# Patient Record
Sex: Female | Born: 1950 | Race: White | Hispanic: No | Smoking: Former smoker
Health system: Southern US, Community
[De-identification: ages and names within clinical notes are randomized; demographics above are authoritative.]

## PROBLEM LIST (undated history)

## (undated) DIAGNOSIS — E119 Type 2 diabetes mellitus without complications: Secondary | ICD-10-CM

## (undated) DIAGNOSIS — Z8619 Personal history of other infectious and parasitic diseases: Secondary | ICD-10-CM

## (undated) DIAGNOSIS — Z1371 Encounter for nonprocreative screening for genetic disease carrier status: Secondary | ICD-10-CM

## (undated) DIAGNOSIS — I10 Essential (primary) hypertension: Secondary | ICD-10-CM

## (undated) DIAGNOSIS — H3530 Unspecified macular degeneration: Secondary | ICD-10-CM

## (undated) DIAGNOSIS — R928 Other abnormal and inconclusive findings on diagnostic imaging of breast: Secondary | ICD-10-CM

## (undated) DIAGNOSIS — Z1231 Encounter for screening mammogram for malignant neoplasm of breast: Principal | ICD-10-CM

## (undated) HISTORY — DX: Encounter for nonprocreative screening for genetic disease carrier status: Z13.71

## (undated) HISTORY — DX: Essential (primary) hypertension: I10

## (undated) HISTORY — PX: FOOT SURGERY: SHX648

## (undated) HISTORY — DX: Type 2 diabetes mellitus without complications: E11.9

## (undated) HISTORY — DX: Personal history of other infectious and parasitic diseases: Z86.19

## (undated) HISTORY — DX: Unspecified macular degeneration: H35.30

## (undated) HISTORY — PX: LASIK: SHX215

---

## 2004-07-13 ENCOUNTER — Ambulatory Visit: Payer: Self-pay | Admitting: General Surgery

## 2004-11-02 ENCOUNTER — Ambulatory Visit: Payer: Self-pay | Admitting: Family Medicine

## 2005-07-15 ENCOUNTER — Ambulatory Visit: Payer: Self-pay | Admitting: General Surgery

## 2006-07-22 ENCOUNTER — Ambulatory Visit: Payer: Self-pay | Admitting: General Surgery

## 2007-07-25 ENCOUNTER — Ambulatory Visit: Payer: Self-pay | Admitting: Family Medicine

## 2008-07-25 ENCOUNTER — Ambulatory Visit: Payer: Self-pay | Admitting: Family Medicine

## 2009-01-15 DIAGNOSIS — E119 Type 2 diabetes mellitus without complications: Secondary | ICD-10-CM | POA: Insufficient documentation

## 2009-01-15 DIAGNOSIS — E1121 Type 2 diabetes mellitus with diabetic nephropathy: Secondary | ICD-10-CM | POA: Insufficient documentation

## 2009-07-21 DIAGNOSIS — L219 Seborrheic dermatitis, unspecified: Secondary | ICD-10-CM | POA: Insufficient documentation

## 2009-10-07 ENCOUNTER — Ambulatory Visit: Payer: Self-pay | Admitting: General Surgery

## 2010-10-09 ENCOUNTER — Ambulatory Visit: Payer: Self-pay | Admitting: General Surgery

## 2011-05-18 DIAGNOSIS — N183 Chronic kidney disease, stage 3 (moderate): Secondary | ICD-10-CM

## 2011-05-18 DIAGNOSIS — N1832 Chronic kidney disease, stage 3b: Secondary | ICD-10-CM | POA: Insufficient documentation

## 2011-05-18 DIAGNOSIS — N184 Chronic kidney disease, stage 4 (severe): Secondary | ICD-10-CM | POA: Insufficient documentation

## 2011-10-12 ENCOUNTER — Ambulatory Visit: Payer: Self-pay | Admitting: General Surgery

## 2012-09-16 ENCOUNTER — Encounter: Payer: Self-pay | Admitting: *Deleted

## 2012-09-16 DIAGNOSIS — Z803 Family history of malignant neoplasm of breast: Secondary | ICD-10-CM | POA: Insufficient documentation

## 2012-09-16 DIAGNOSIS — I10 Essential (primary) hypertension: Secondary | ICD-10-CM | POA: Insufficient documentation

## 2012-09-16 DIAGNOSIS — E7800 Pure hypercholesterolemia, unspecified: Secondary | ICD-10-CM | POA: Insufficient documentation

## 2012-10-12 ENCOUNTER — Ambulatory Visit: Payer: Self-pay | Admitting: General Surgery

## 2012-10-17 ENCOUNTER — Encounter: Payer: Self-pay | Admitting: *Deleted

## 2012-10-25 ENCOUNTER — Ambulatory Visit: Payer: Self-pay | Admitting: General Surgery

## 2012-11-08 ENCOUNTER — Encounter: Payer: Self-pay | Admitting: General Surgery

## 2012-11-08 ENCOUNTER — Ambulatory Visit (INDEPENDENT_AMBULATORY_CARE_PROVIDER_SITE_OTHER): Payer: PRIVATE HEALTH INSURANCE | Admitting: General Surgery

## 2012-11-08 VITALS — BP 160/78 | HR 76 | Resp 14 | Ht 67.0 in | Wt 191.0 lb

## 2012-11-08 DIAGNOSIS — Z803 Family history of malignant neoplasm of breast: Secondary | ICD-10-CM

## 2012-11-08 DIAGNOSIS — Z1211 Encounter for screening for malignant neoplasm of colon: Secondary | ICD-10-CM

## 2012-11-08 NOTE — Patient Instructions (Addendum)
Patient advised of benefits of genetic testing for breast cancer. Patient uncertain about having a colonoscopy done. She has been asked to take home hemacult cards and return then at a later date.    Patient will be asked to return to the office in one year for a bilateral screening mammogram.

## 2012-11-08 NOTE — Progress Notes (Signed)
Patient ID: Anna Sweeney, female   DOB: 1951-02-24, 62 y.o.   MRN: [id number]  Chief Complaint  Patient presents with  . Follow-up    screening mammogram    HPI Anna Sweeney is a 62 y.o. female who presents for a follow up mammogram. Most recent mammogram was done on 10/12/12 at [facility] with a birad category 2. The patient has a family history of breast cancer including her mother and sister. Patient states no new problems with her breasts.  HPI  Past Medical History  Diagnosis Date  . High cholesterol   . Family history of malignant neoplasm of breast   . Hypertension 2009    Past Surgical History  Procedure Laterality Date  . Foot surgery      Family History  Problem Relation Age of Onset  . Breast cancer Mother   . Breast cancer Sister     Social History History  Substance Use Topics  . Smoking status: Former Smoker -- 1.00 packs/day for 4 years  . Smokeless tobacco: Not on file  . Alcohol Use: No    No Known Allergies  Current Outpatient Prescriptions  Medication Sig Dispense Refill  . GLIPIZIDE PO Take 5 mg by mouth daily.       Marland Kitchen lovastatin (MEVACOR) 20 MG tablet Take 1 tablet by mouth daily.      . metFORMIN (GLUCOPHAGE) 850 MG tablet Take 1 tablet by mouth daily.      . TRANDOLAPRIL PO Take 4 mg by mouth daily.       . verapamil (CALAN-SR) 240 MG CR tablet Take 1 tablet by mouth daily.       No current facility-administered medications for this visit.    Review of Systems Review of Systems  Constitutional: Negative.   Respiratory: Negative.   Cardiovascular: Negative.     Blood pressure 160/78, pulse 76, resp. rate 14, height 5\' 7"  (1.702 m), weight 191 lb (86.637 kg).  Physical Exam Physical Exam  Constitutional: She appears well-developed and well-nourished.  Eyes: Conjunctivae are normal. No scleral icterus.  Neck: Trachea normal. No mass and no thyromegaly present.  Cardiovascular: Normal rate, regular rhythm and normal heart sounds.   Pulses:    Dorsalis pedis pulses are 3+ on the right side, and 3+ on the left side.       Posterior tibial pulses are 3+ on the right side, and 3+ on the left side.  Pulmonary/Chest: Effort normal and breath sounds normal. Right breast exhibits no inverted nipple, no mass, no nipple discharge, no skin change and no tenderness. Left breast exhibits no inverted nipple, no mass, no nipple discharge, no skin change and no tenderness. Breasts are symmetrical.  Abdominal: Soft. Normal appearance and bowel sounds are normal. There is no hepatosplenomegaly. There is no tenderness. No hernia. Hernia confirmed negative in the ventral area.  Lymphadenopathy:    She has no cervical adenopathy.    She has no axillary adenopathy.       Right: No supraclavicular adenopathy present.       Left: No supraclavicular adenopathy present.    Data Reviewed Mammogram reviewed-BIRADS 2  Assessment    Breast exam is stable.      Plan    Discussed colonoscopy-pt still does not want to have it done. Agreed to stool check for blood. Given high risk, discussed genetic tesing- BRCA. Pt will get it done assuming insurance covers.        Greta Doom F 11/08/2012, 9:58 AM

## 2012-11-15 ENCOUNTER — Ambulatory Visit (INDEPENDENT_AMBULATORY_CARE_PROVIDER_SITE_OTHER): Payer: PRIVATE HEALTH INSURANCE | Admitting: *Deleted

## 2012-11-15 DIAGNOSIS — Z1211 Encounter for screening for malignant neoplasm of colon: Secondary | ICD-10-CM

## 2012-11-15 DIAGNOSIS — Z1371 Encounter for nonprocreative screening for genetic disease carrier status: Secondary | ICD-10-CM

## 2012-11-15 DIAGNOSIS — Z803 Family history of malignant neoplasm of breast: Secondary | ICD-10-CM

## 2012-11-15 HISTORY — DX: Encounter for nonprocreative screening for genetic disease carrier status: Z13.71

## 2012-11-15 NOTE — Patient Instructions (Addendum)
Genetic testing reviewed and completed.  Will call with results.  Patient brought in occult cards.  Dated 11-08-12, 11-09-12, 11-10-12, all negative.

## 2012-11-16 ENCOUNTER — Telehealth: Payer: Self-pay | Admitting: *Deleted

## 2012-11-16 NOTE — Telephone Encounter (Signed)
Notified patient as instructed, patient pleased. Discussed possible colonoscopy.

## 2012-11-16 NOTE — Telephone Encounter (Signed)
Message copied by Currie Paris on Thu Nov 16, 2012  8:47 AM ------      Message from: Kieth Brightly      Created: Thu Nov 16, 2012  8:21 AM       Inform pt stool checks were negative. Encourage her to still consider colonoscopy.       ----- Message -----         From: Currie Paris, RN         Sent: 11/15/2012  10:40 AM           To: Kieth Brightly, MD                   ------

## 2012-11-27 ENCOUNTER — Encounter: Payer: Self-pay | Admitting: General Surgery

## 2012-11-28 ENCOUNTER — Telehealth: Payer: Self-pay | Admitting: *Deleted

## 2012-11-28 NOTE — Telephone Encounter (Signed)
Please inform Anna Sweeney(DOB 04/10/1951)-her genetic testing was negative, Pt pleased.

## 2012-11-29 ENCOUNTER — Encounter: Payer: Self-pay | Admitting: General Surgery

## 2012-12-19 ENCOUNTER — Encounter: Payer: Self-pay | Admitting: General Surgery

## 2013-07-10 LAB — HM PAP SMEAR: HM PAP: NEGATIVE

## 2013-11-07 ENCOUNTER — Ambulatory Visit: Payer: Self-pay | Admitting: General Surgery

## 2013-11-07 IMAGING — MG MM DIGITAL SCREENING BILAT W/ CAD
1 series · 6 of 6 positions shown · non-contrast
Comparison: Previous exam(s).

CLINICAL DATA: Screening.

EXAM:
DIGITAL SCREENING BILATERAL MAMMOGRAM WITH CAD

[R CC · right · 6 of 6 slices shown]
[im 1/6]
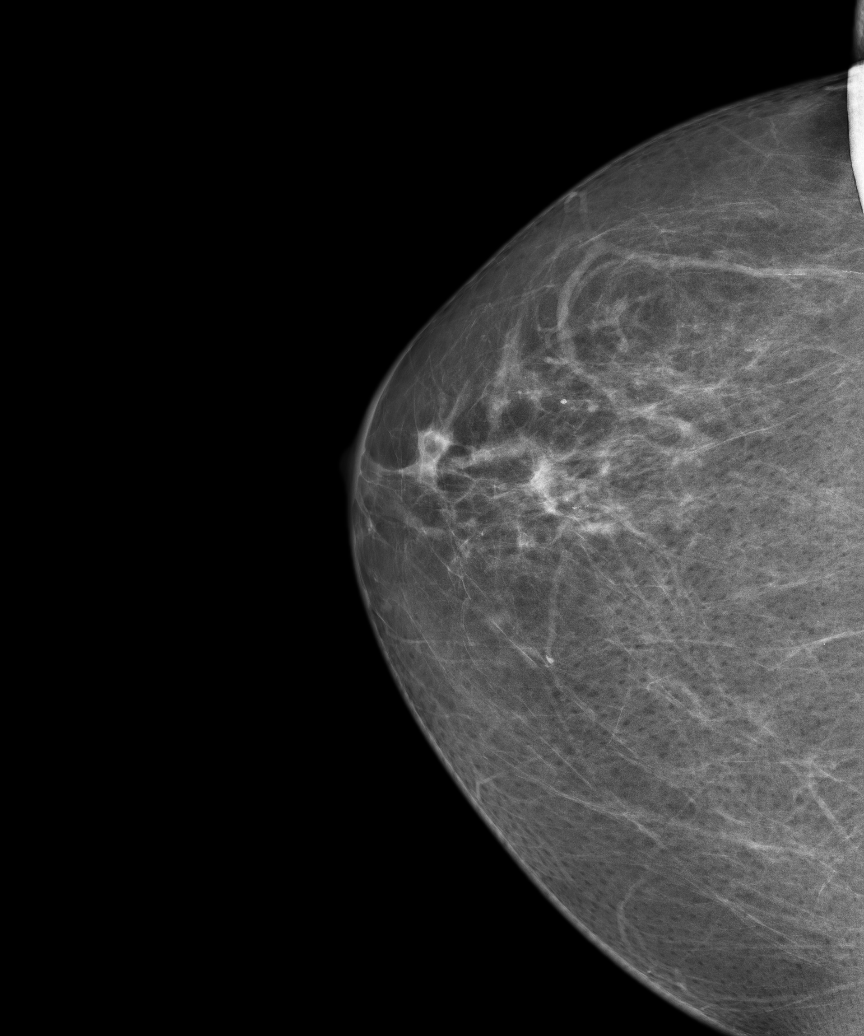
[im 2/6]
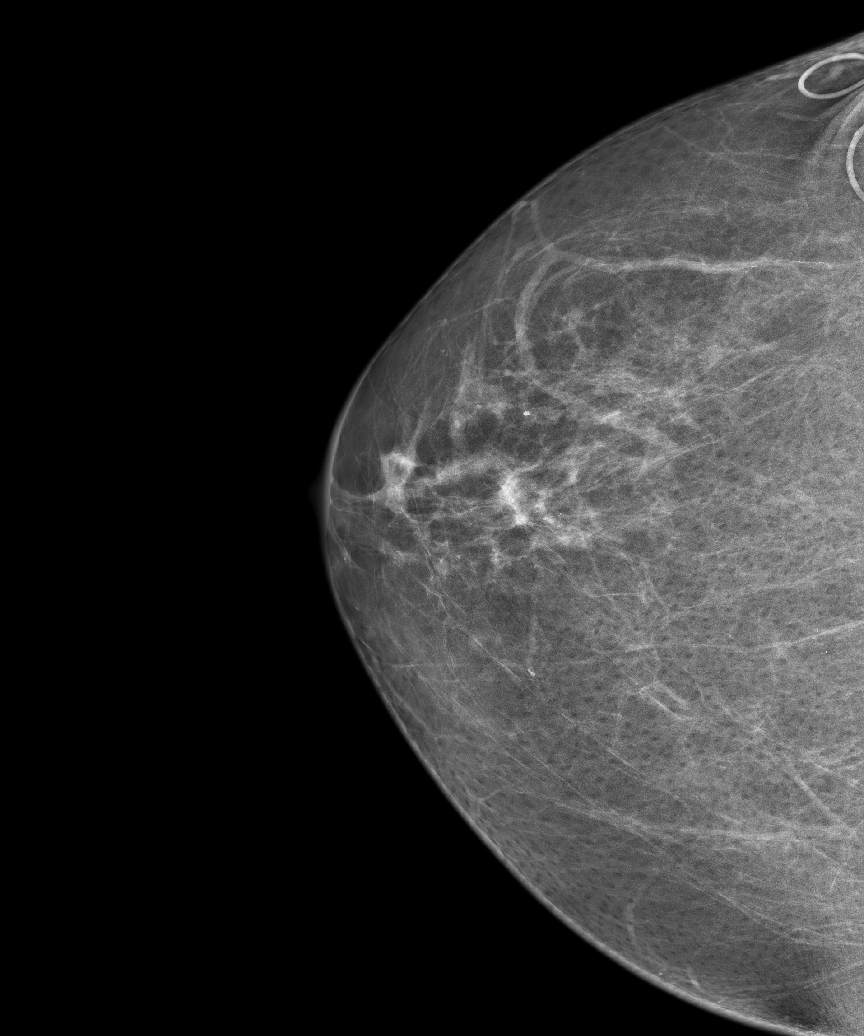
[im 3/6]
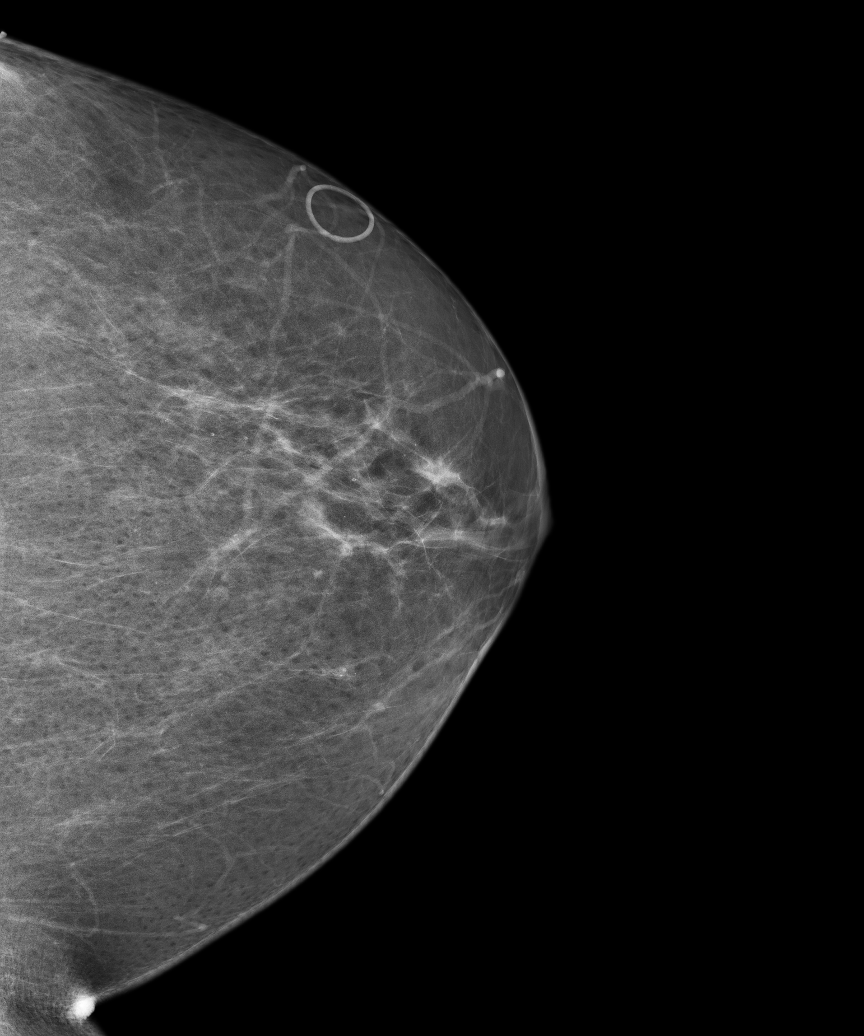
[im 4/6]
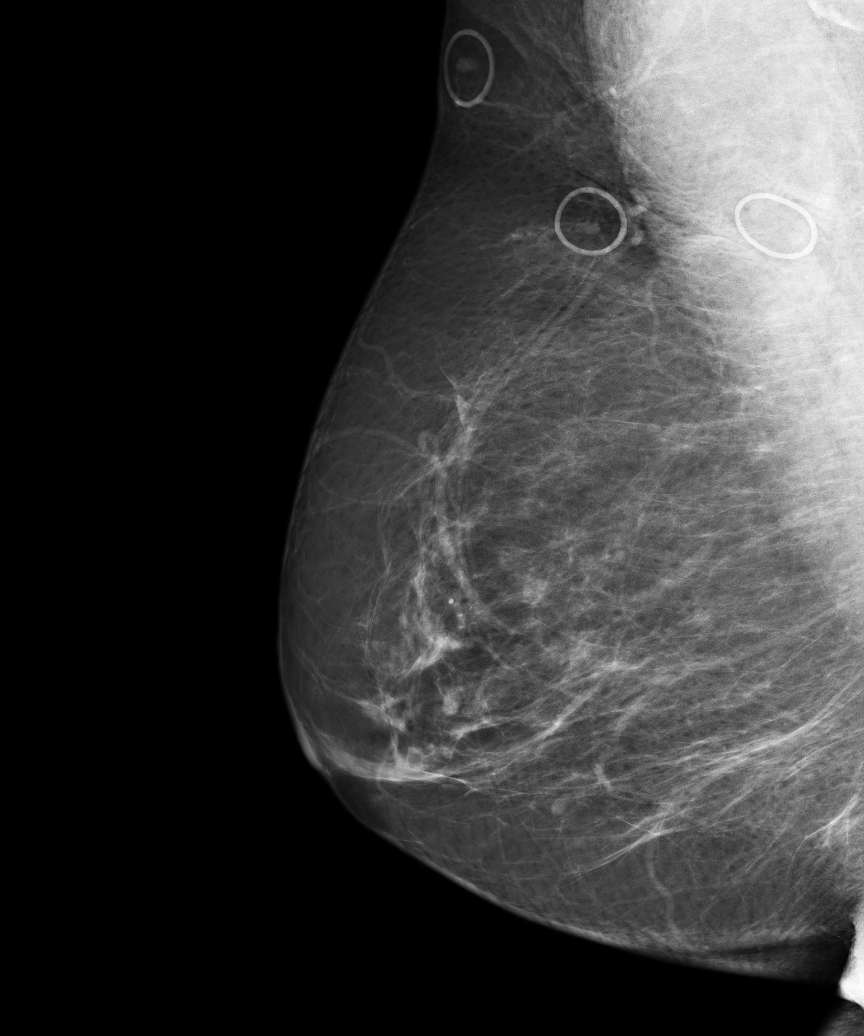
[im 5/6]
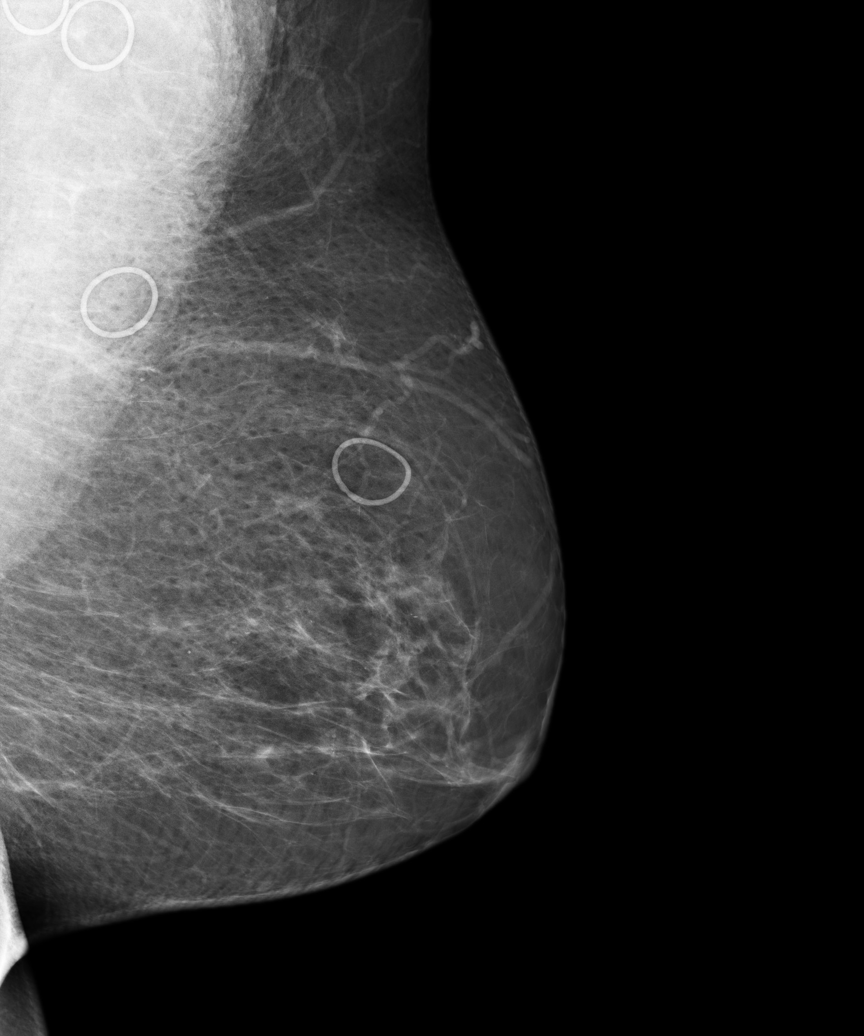
[im 6/6]
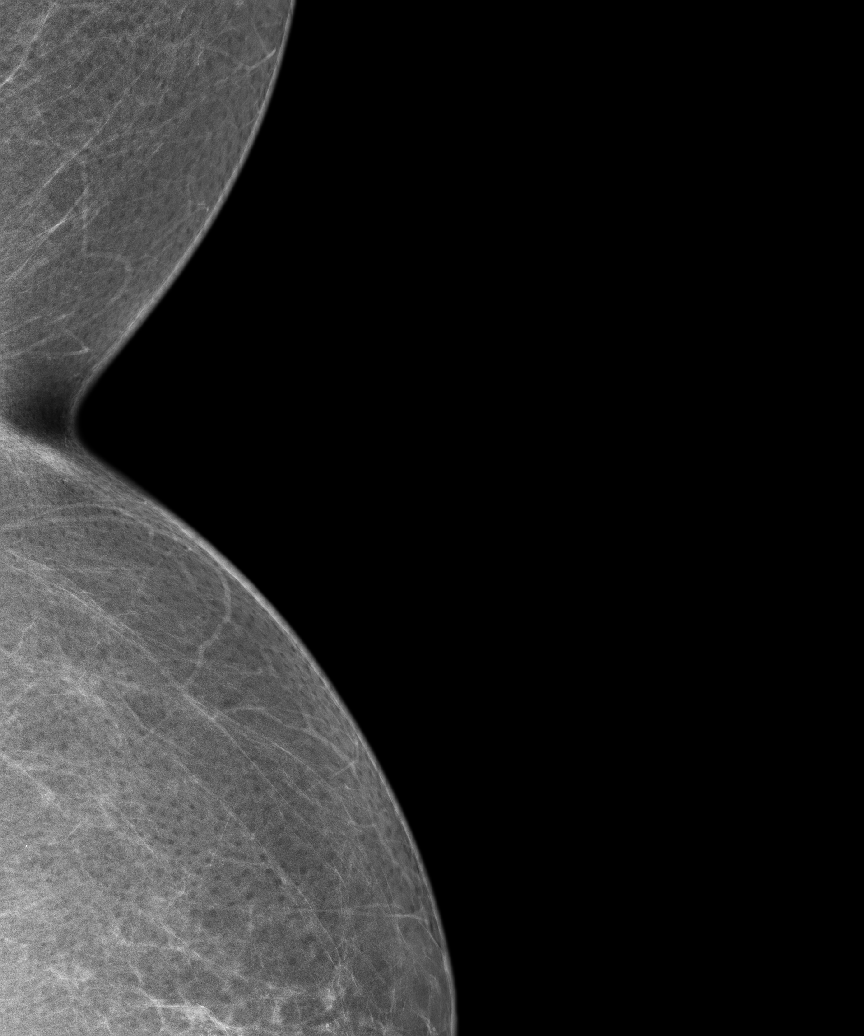

[6 of 6 positions shown; findings below may reference images not displayed]

ACR Breast Density Category b: There are scattered areas of
fibroglandular density.
FINDINGS: There are no findings suspicious for malignancy. Images were
processed with CAD.
IMPRESSION: No mammographic evidence of malignancy. A result letter of this
screening mammogram will be mailed directly to the patient.

RECOMMENDATION:
Screening mammogram in one year. (Code:[US])

BI-RADS CATEGORY  1: Negative.

## 2013-11-08 ENCOUNTER — Encounter: Payer: Self-pay | Admitting: General Surgery

## 2013-11-15 ENCOUNTER — Encounter: Payer: Self-pay | Admitting: General Surgery

## 2013-11-15 ENCOUNTER — Ambulatory Visit (INDEPENDENT_AMBULATORY_CARE_PROVIDER_SITE_OTHER): Payer: Managed Care, Other (non HMO) | Admitting: General Surgery

## 2013-11-15 ENCOUNTER — Ambulatory Visit: Payer: PRIVATE HEALTH INSURANCE | Admitting: General Surgery

## 2013-11-15 VITALS — BP 124/66 | HR 76 | Resp 12 | Ht 66.0 in | Wt 187.0 lb

## 2013-11-15 DIAGNOSIS — Z803 Family history of malignant neoplasm of breast: Secondary | ICD-10-CM

## 2013-11-15 NOTE — Progress Notes (Signed)
Patient ID: Anna Sweeney, female   DOB: [birth year], 63 y.o.   MRN: [id number]  Chief Complaint  Patient presents with  . Follow-up    mammogram    HPI Anna Sweeney is a 63 y.o. female.  who presents for her follow up breast evaluation. The most recent mammogram was done on 11-07-13 .  Patient does perform regular self breast checks and gets regular mammograms done.  No new breast issues, currently has upper respiratory congestion. Genetic test completed last year and was negative for [facility].  HPI  Past Medical History  Diagnosis Date  . High cholesterol   . Family history of malignant neoplasm of breast   . Hypertension 2009    Past Surgical History  Procedure Laterality Date  . Foot surgery      Family History  Problem Relation Age of Onset  . Breast cancer Mother   . Breast cancer Sister   . Cancer Sister     renal     Social History History  Substance Use Topics  . Smoking status: Former Smoker -- 1.00 packs/day for 4 years  . Smokeless tobacco: Not on file  . Alcohol Use: No    No Known Allergies  Current Outpatient Prescriptions  Medication Sig Dispense Refill  . GLIPIZIDE PO Take 5 mg by mouth daily.       Marland Kitchen lovastatin (MEVACOR) 20 MG tablet Take 1 tablet by mouth daily.      . metFORMIN (GLUCOPHAGE) 850 MG tablet Take 1 tablet by mouth daily.      . TRANDOLAPRIL PO Take 4 mg by mouth daily.       . verapamil (CALAN-SR) 240 MG CR tablet Take 1 tablet by mouth daily.       No current facility-administered medications for this visit.    Review of Systems Review of Systems  Constitutional: Negative.   Respiratory: Positive for cough.   Cardiovascular: Negative.     Blood pressure 124/66, pulse 76, resp. rate 12, height _0  (1.676 m), weight 187 lb (84.823 kg).  Physical Exam Physical Exam  Constitutional: She is oriented to person, place, and time. She appears well-developed and well-nourished.  Eyes: Conjunctivae are normal.  Neck: Neck supple.   Cardiovascular: Normal rate, regular rhythm and normal heart sounds.   Pulmonary/Chest: Effort normal and breath sounds normal. Right breast exhibits no inverted nipple, no mass, no nipple discharge, no skin change and no tenderness. Left breast exhibits no inverted nipple, no mass, no nipple discharge, no skin change and no tenderness.  Abdominal: Soft. There is no tenderness.  Lymphadenopathy:    She has no cervical adenopathy.    She has no axillary adenopathy.  Neurological: She is alert and oriented to person, place, and time.  Skin: Skin is warm and dry.    Data Reviewed Mammogram reviewed and stable.   Assessment    stable physical exam. High risk due to family history, BRCA negative. Patient still declines colonoscopy.    Plan    Follow up in one year with bilateral screening mammogram and office visit. She was given stool cards to complete and return.       Waco Foerster G Emillio Ngo 11/15/2013, 9:32 AM

## 2013-11-15 NOTE — Patient Instructions (Addendum)
Follow up in one year with bilateral screening mammogram and office visit. Continue self breast exams. Call office for any new breast issues or concerns. complete stool cards and return to office

## 2014-01-04 ENCOUNTER — Other Ambulatory Visit: Payer: Self-pay | Admitting: Orthopaedic Surgery

## 2014-01-04 DIAGNOSIS — M479 Spondylosis, unspecified: Secondary | ICD-10-CM

## 2014-01-16 ENCOUNTER — Ambulatory Visit
Admission: RE | Admit: 2014-01-16 | Discharge: 2014-01-16 | Disposition: A | Discharge status: Home / Self Care | Payer: Commercial Managed Care - HMO | Source: Ambulatory Visit | Attending: Orthopaedic Surgery | Admitting: Orthopaedic Surgery

## 2014-01-16 VITALS — BP 125/68 | HR 70

## 2014-01-16 DIAGNOSIS — M479 Spondylosis, unspecified: Secondary | ICD-10-CM

## 2014-01-16 MED ORDER — DIAZEPAM 5 MG PO TABS
10.0000 mg | ORAL_TABLET | Freq: Once | ORAL | Status: AC
  Administered 2014-01-16: 10 mg via ORAL

## 2014-01-16 MED ORDER — IOHEXOL 180 MG/ML  SOLN
15.0000 mL | Freq: Once | INTRAMUSCULAR | Status: AC | PRN
  Administered 2014-01-16: 15 mL via INTRATHECAL

## 2014-01-16 NOTE — Discharge Instructions (Signed)
Myelogram Discharge Instructions  1. Go home and rest quietly for the next 24 hours.  It is important to lie flat for the next 24 hours.  Get up only to go to the restroom.  You may lie in the bed or on a couch on your back, your stomach, your left side or your right side.  You may have one pillow under your head.  You may have pillows between your knees while you are on your side or under your knees while you are on your back.  2. DO NOT drive today.  Recline the seat as far back as it will go, while still wearing your seat belt, on the way home.  3. You may get up to go to the bathroom as needed.  You may sit up for 10 minutes to eat.  You may resume your normal diet and medications unless otherwise indicated.  Drink lots of extra fluids today and tomorrow.  4. The incidence of headache, nausea, or vomiting is about 5% (one in 20 patients).  If you develop a headache, lie flat and drink plenty of fluids until the headache goes away.  Caffeinated beverages may be helpful.  If you develop severe nausea and vomiting or a headache that does not go away with flat bed rest, call [phone].  5. You may resume normal activities after your 24 hours of bed rest is over; however, do not exert yourself strongly or do any heavy lifting tomorrow. If when you get up you have a headache when standing, go back to bed and force fluids for another 24 hours.  6. Call your physician for a follow-up appointment.  The results of your myelogram will be sent directly to your physician by the following day.  7. If you have any questions or if complications develop after you arrive home, please call [phone].  Discharge instructions have been explained to the patient.  The patient, or the person responsible for the patient, fully understands these instructions.      May resume Trazodone on January 17, 2014, after 9:30 am.

## 2014-01-16 NOTE — Progress Notes (Signed)
Pt states she stop trazodone on Saturday. Discharge instructions explained to pt. JKL RN

## 2014-06-10 ENCOUNTER — Encounter: Payer: Self-pay | Admitting: General Surgery

## 2014-10-29 LAB — HEMOGLOBIN A1C: Hgb A1c MFr Bld: 7.3 % — AB (ref 4.0–6.0)

## 2014-10-30 LAB — BASIC METABOLIC PANEL
CREATININE: 1.3 mg/dL — AB (ref 0.5–1.1)
GLUCOSE: 121 mg/dL

## 2014-10-30 LAB — LIPID PANEL
Cholesterol: 163 mg/dL (ref 0–200)
HDL: 37 mg/dL (ref 35–70)
LDL Cholesterol: 88 mg/dL
Triglycerides: 190 mg/dL — AB (ref 40–160)

## 2014-10-30 LAB — TSH: TSH: 1.63 u[IU]/mL (ref 0.41–5.90)

## 2014-11-27 ENCOUNTER — Encounter: Payer: Self-pay | Admitting: General Surgery

## 2014-12-03 ENCOUNTER — Encounter: Payer: Self-pay | Admitting: General Surgery

## 2014-12-03 ENCOUNTER — Ambulatory Visit (INDEPENDENT_AMBULATORY_CARE_PROVIDER_SITE_OTHER): Payer: Managed Care, Other (non HMO) | Admitting: General Surgery

## 2014-12-03 VITALS — BP 130/64 | HR 72 | Resp 12 | Ht 66.0 in | Wt 188.0 lb

## 2014-12-03 DIAGNOSIS — Z803 Family history of malignant neoplasm of breast: Secondary | ICD-10-CM | POA: Diagnosis not present

## 2014-12-03 NOTE — Patient Instructions (Addendum)
Patient will be asked to return to the office in one year with a bilateral screening mammogram.  Continue self breast exams. Call office for any new breast issues or concerns.  

## 2014-12-03 NOTE — Progress Notes (Signed)
Patient ID: Anna Sweeney, female   DOB: 1951/07/28, 64 y.o.   MRN: [id number]  Chief Complaint  Patient presents with  . Follow-up    mammogram    HPI Anna Sweeney is a 64 y.o. female who presents for a breast evaluation. The most recent mammogram was done on 11/25/14.  Patient does perform regular self breast checks and gets regular mammograms done.    HPI  Past Medical History  Diagnosis Date  . High cholesterol   . Family history of malignant neoplasm of breast   . Hypertension 2009    Past Surgical History  Procedure Laterality Date  . Foot surgery      Family History  Problem Relation Age of Onset  . Breast cancer Mother   . Breast cancer Sister   . Cancer Sister     renal     Social History History  Substance Use Topics  . Smoking status: Former Smoker -- 1.00 packs/day for 4 years  . Smokeless tobacco: Not on file  . Alcohol Use: No    No Known Allergies  Current Outpatient Prescriptions  Medication Sig Dispense Refill  . ASSURE COMFORT LANCETS 30G MISC     . glipiZIDE (GLUCOTROL XL) 10 MG 24 hr tablet     . lovastatin (MEVACOR) 20 MG tablet Take 1 tablet by mouth daily.    . metFORMIN (GLUCOPHAGE) 850 MG tablet Take 1 tablet by mouth daily.    . ONE TOUCH ULTRA TEST test strip     . trandolapril (MAVIK) 4 MG tablet     . verapamil (VERELAN PM) 240 MG 24 hr capsule      No current facility-administered medications for this visit.    Review of Systems Review of Systems  Constitutional: Negative.   Respiratory: Negative.   Cardiovascular: Negative.     Blood pressure 130/64, pulse 72, resp. rate 12, height 5' 6"  (1.676 m), weight 85.276 kg (188 lb).  Physical Exam Physical Exam  Constitutional: She is oriented to person, place, and time. She appears well-developed and well-nourished.  Eyes: Conjunctivae are normal. No scleral icterus.  Neck: Neck supple.  Cardiovascular: Normal rate, regular rhythm and normal heart sounds.   Pulmonary/Chest:  Effort normal and breath sounds normal. Right breast exhibits no inverted nipple, no mass, no nipple discharge, no skin change and no tenderness. Left breast exhibits no inverted nipple, no mass, no nipple discharge, no skin change and no tenderness.  Abdominal: Soft. Bowel sounds are normal. There is no tenderness.  Lymphadenopathy:    She has no cervical adenopathy.    She has no axillary adenopathy.  Neurological: She is alert and oriented to person, place, and time.  Skin: Skin is warm and dry.    Data Reviewed Mammogram reviewed  Assessment    Stable exam, Family history of breast cancer. BRCA negative.     Plan   Patient will be asked to return to the office in one year with a bilateral screening mammogram. Denies colonoscopy at this time.         Candace Ramus G 12/03/2014, 9:54 AM

## 2015-04-21 ENCOUNTER — Other Ambulatory Visit: Payer: Self-pay | Admitting: Family Medicine

## 2015-04-21 NOTE — Telephone Encounter (Signed)
Received request from her mail order pharmacy to refill prescriptions. But she is overdue for o.v. Please advise she needs to schedule follow up visit before we can approve refill. Thanks.

## 2015-04-23 NOTE — Telephone Encounter (Signed)
Left message to call back. Will try again later.  Thanks,   

## 2015-04-24 NOTE — Telephone Encounter (Signed)
Advised pt as directed below, pt stated that she is completely out of the medications, she was transferred to front desk to make the follow-up appointment. She is scheduled for 04/30/2015.   She uses [facility] in [place],

## 2015-04-24 NOTE — Telephone Encounter (Signed)
Left message to call back.  Thanks, 

## 2015-04-25 ENCOUNTER — Other Ambulatory Visit: Payer: Self-pay | Admitting: Family Medicine

## 2015-04-25 DIAGNOSIS — E7800 Pure hypercholesterolemia, unspecified: Secondary | ICD-10-CM

## 2015-04-25 DIAGNOSIS — I10 Essential (primary) hypertension: Secondary | ICD-10-CM

## 2015-04-25 DIAGNOSIS — E119 Type 2 diabetes mellitus without complications: Secondary | ICD-10-CM

## 2015-04-25 NOTE — Telephone Encounter (Signed)
Pt stated that she is completely out of these medications. Pt is scheduled for an ov on Wednesday. 04/30/2015 10:15 am.  Thanks,

## 2015-04-30 ENCOUNTER — Ambulatory Visit
Admission: RE | Admit: 2015-04-30 | Discharge: 2015-04-30 | Disposition: A | Discharge status: Home / Self Care | Payer: Managed Care, Other (non HMO) | Source: Ambulatory Visit | Attending: Family Medicine | Admitting: Family Medicine

## 2015-04-30 ENCOUNTER — Encounter: Payer: Self-pay | Admitting: Family Medicine

## 2015-04-30 ENCOUNTER — Ambulatory Visit (INDEPENDENT_AMBULATORY_CARE_PROVIDER_SITE_OTHER): Payer: Managed Care, Other (non HMO) | Admitting: Family Medicine

## 2015-04-30 VITALS — BP 110/60 | HR 79 | Temp 98.5°F | Resp 16 | Wt 190.0 lb

## 2015-04-30 DIAGNOSIS — L309 Dermatitis, unspecified: Secondary | ICD-10-CM | POA: Insufficient documentation

## 2015-04-30 DIAGNOSIS — E7800 Pure hypercholesterolemia, unspecified: Secondary | ICD-10-CM

## 2015-04-30 DIAGNOSIS — M546 Pain in thoracic spine: Secondary | ICD-10-CM | POA: Diagnosis not present

## 2015-04-30 DIAGNOSIS — I10 Essential (primary) hypertension: Secondary | ICD-10-CM

## 2015-04-30 DIAGNOSIS — L301 Dyshidrosis [pompholyx]: Secondary | ICD-10-CM | POA: Insufficient documentation

## 2015-04-30 DIAGNOSIS — M47814 Spondylosis without myelopathy or radiculopathy, thoracic region: Secondary | ICD-10-CM | POA: Insufficient documentation

## 2015-04-30 DIAGNOSIS — E119 Type 2 diabetes mellitus without complications: Secondary | ICD-10-CM | POA: Diagnosis not present

## 2015-04-30 LAB — POCT GLYCOSYLATED HEMOGLOBIN (HGB A1C)
Est. average glucose Bld gHb Est-mCnc: 252
Hemoglobin A1C: 10.4

## 2015-04-30 IMAGING — CR DG RIBS 2V*L*
1 series · 6 of 6 positions shown · non-contrast
Comparison: None.

CLINICAL DATA: History of left side anterior pain and burning
sensation left breast area for 3-4 months with no known injury

EXAM:
LEFT RIBS - 2 VIEW

[Series 1: dg ribs unilateral left · 0.14mm/px · 6 of 6 slices shown]
[im 1/6]
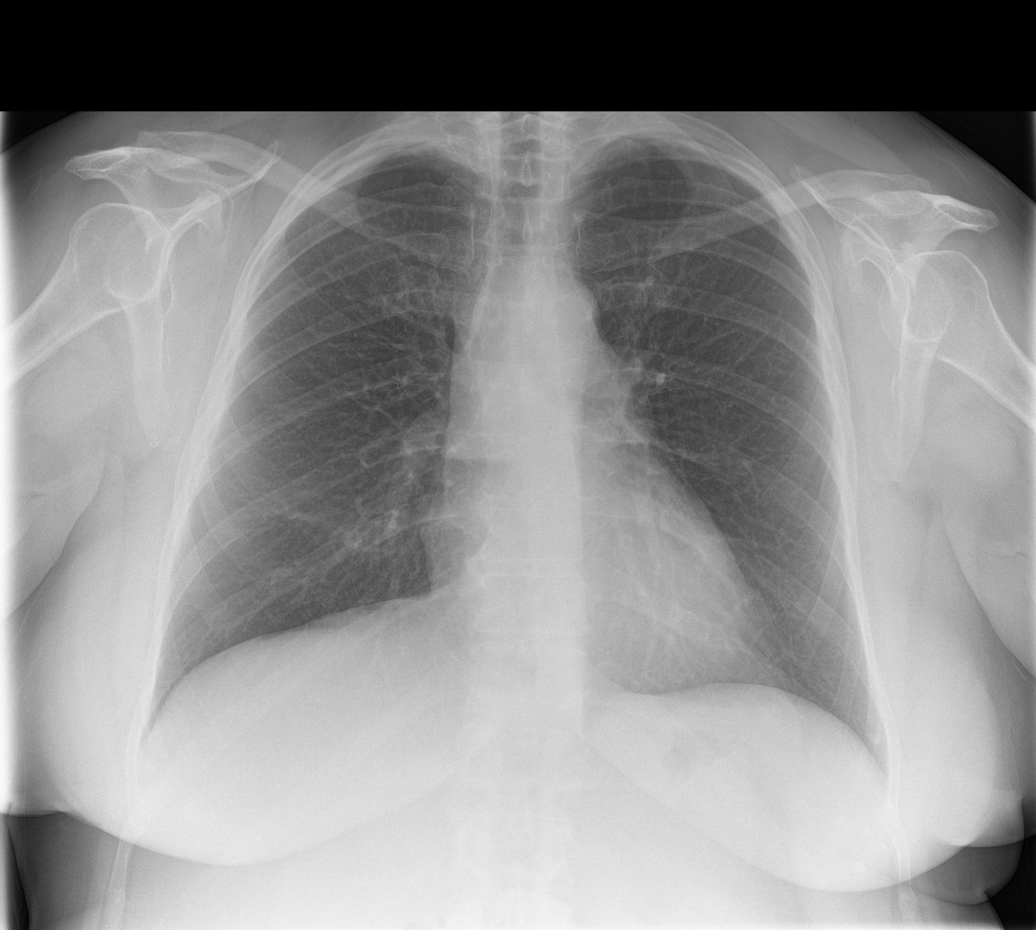
[im 2/6]
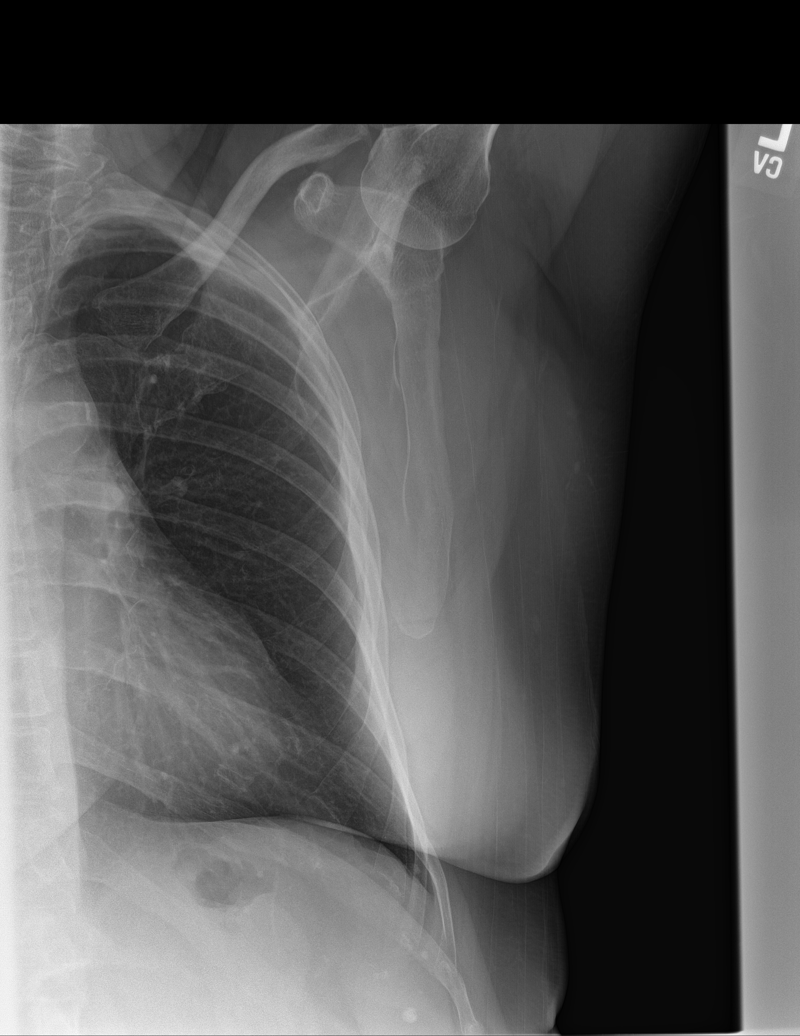
[im 3/6]
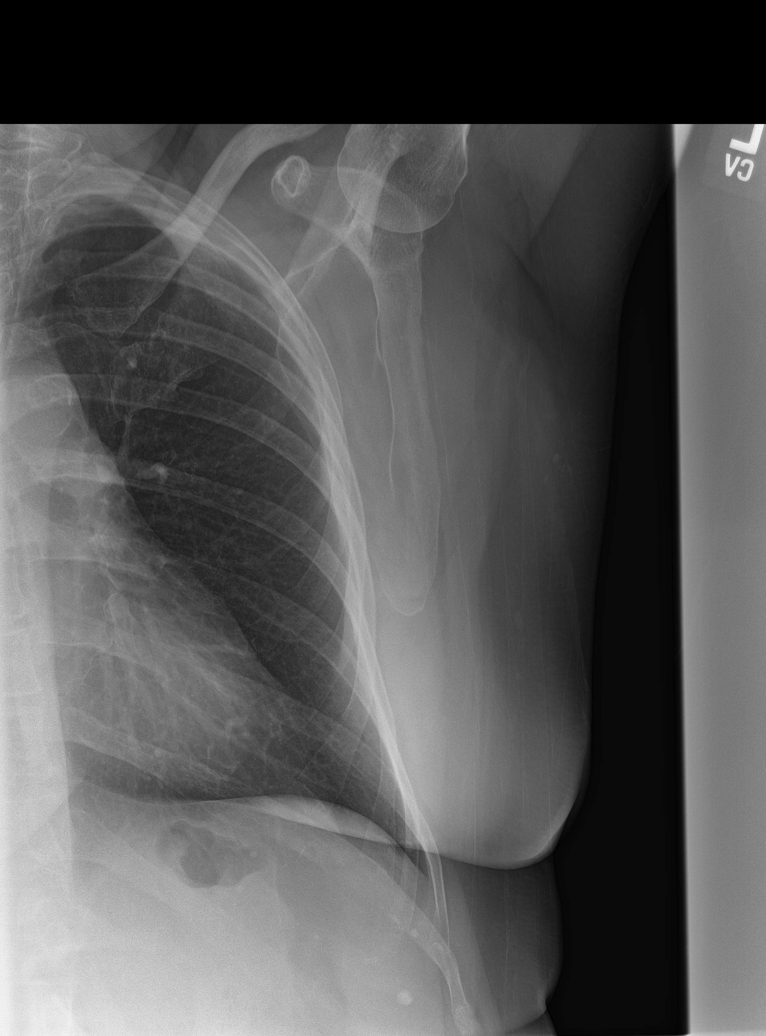
[im 4/6]
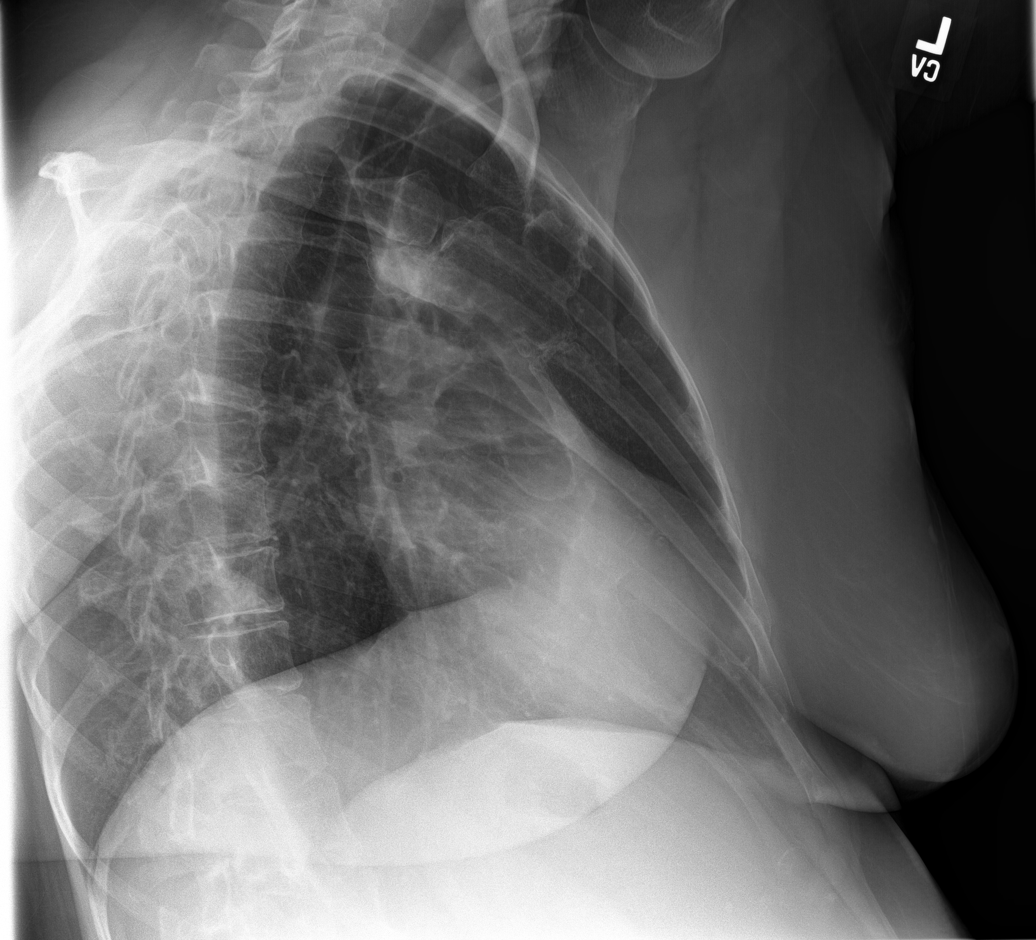
[im 5/6]
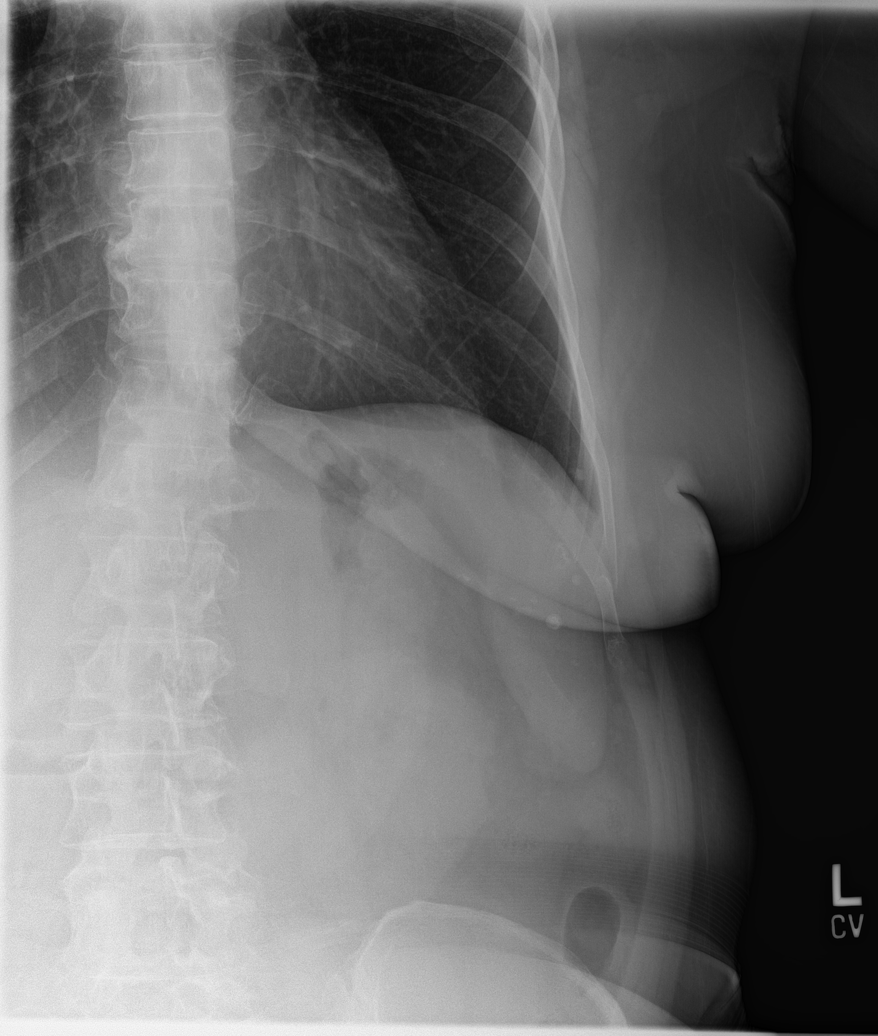
[im 6/6]
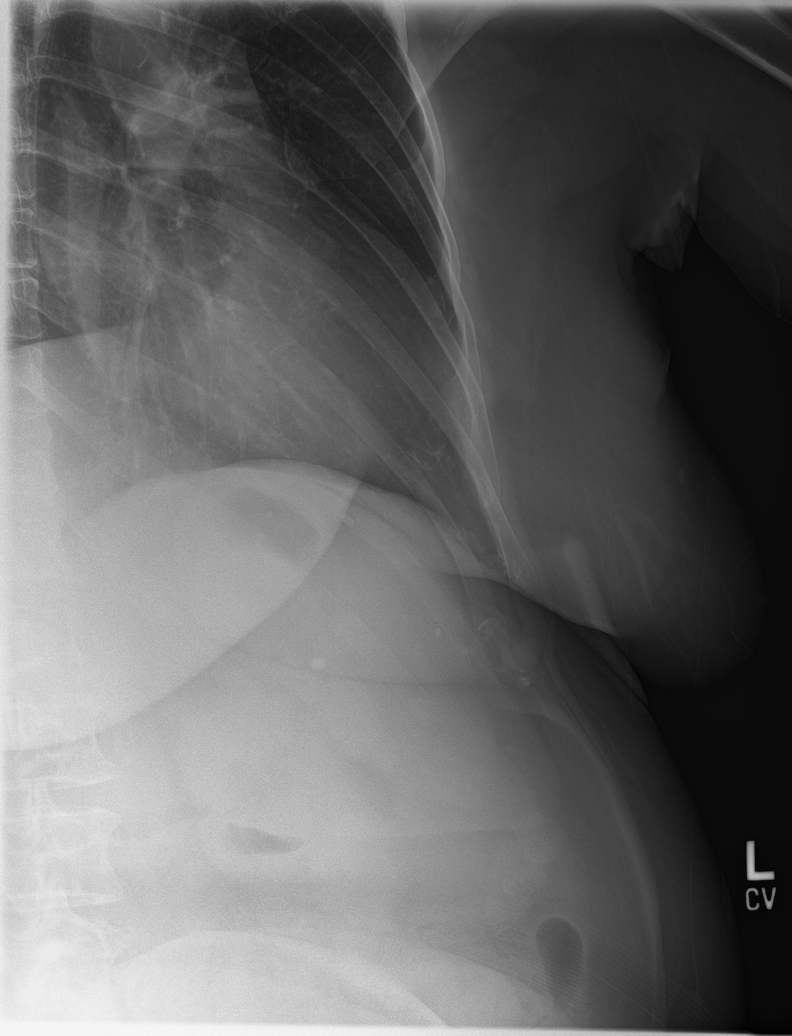

[6 of 6 positions shown; findings below may reference images not displayed]

FINDINGS: Heart size and vascular pattern are normal. No consolidation
effusion or pneumothorax. No rib lesions. No pleural effusion. 6 mm
pulmonary nodule over the left costophrenic angle.
IMPRESSION: No acute findings but there is a 6 mm pulmonary nodule present.
Consider further evaluation with CT thorax.

## 2015-04-30 IMAGING — CR DG THORACIC SPINE 2V
1 series · 3 of 3 positions shown · non-contrast
Comparison: None.

CLINICAL DATA: Left scapula pain, back pain, pain for 3 months, no
known injury

EXAM:
THORACIC SPINE 2 VIEWS

[Series 1: dg thoracic spine 2 view · 0.14mm/px · 3 of 3 slices shown]
[im 1/3]
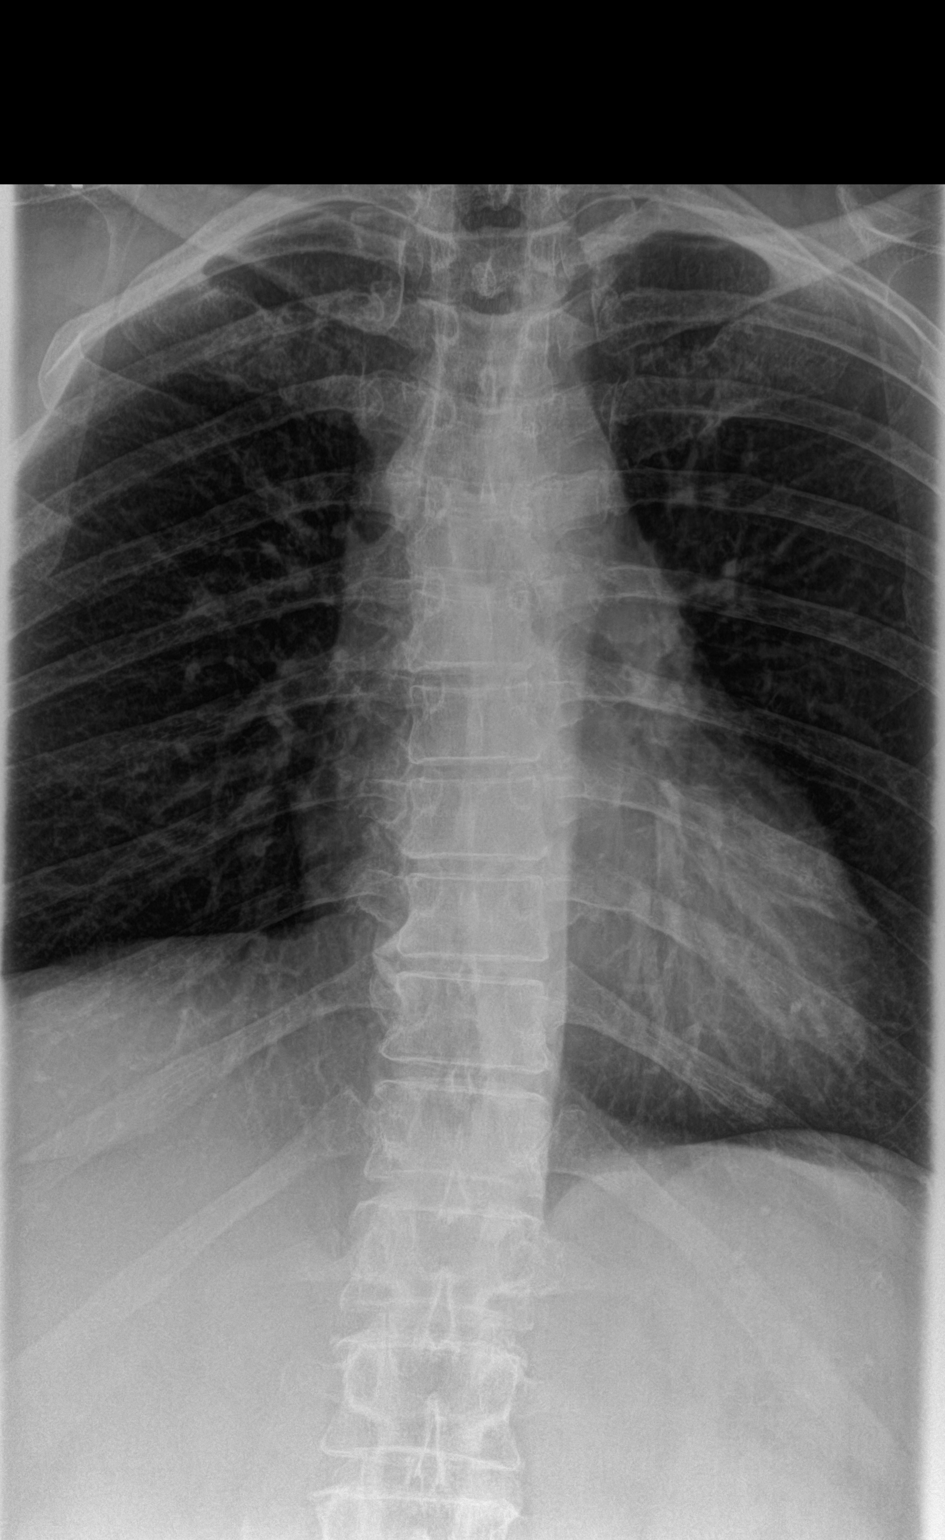
[im 2/3]
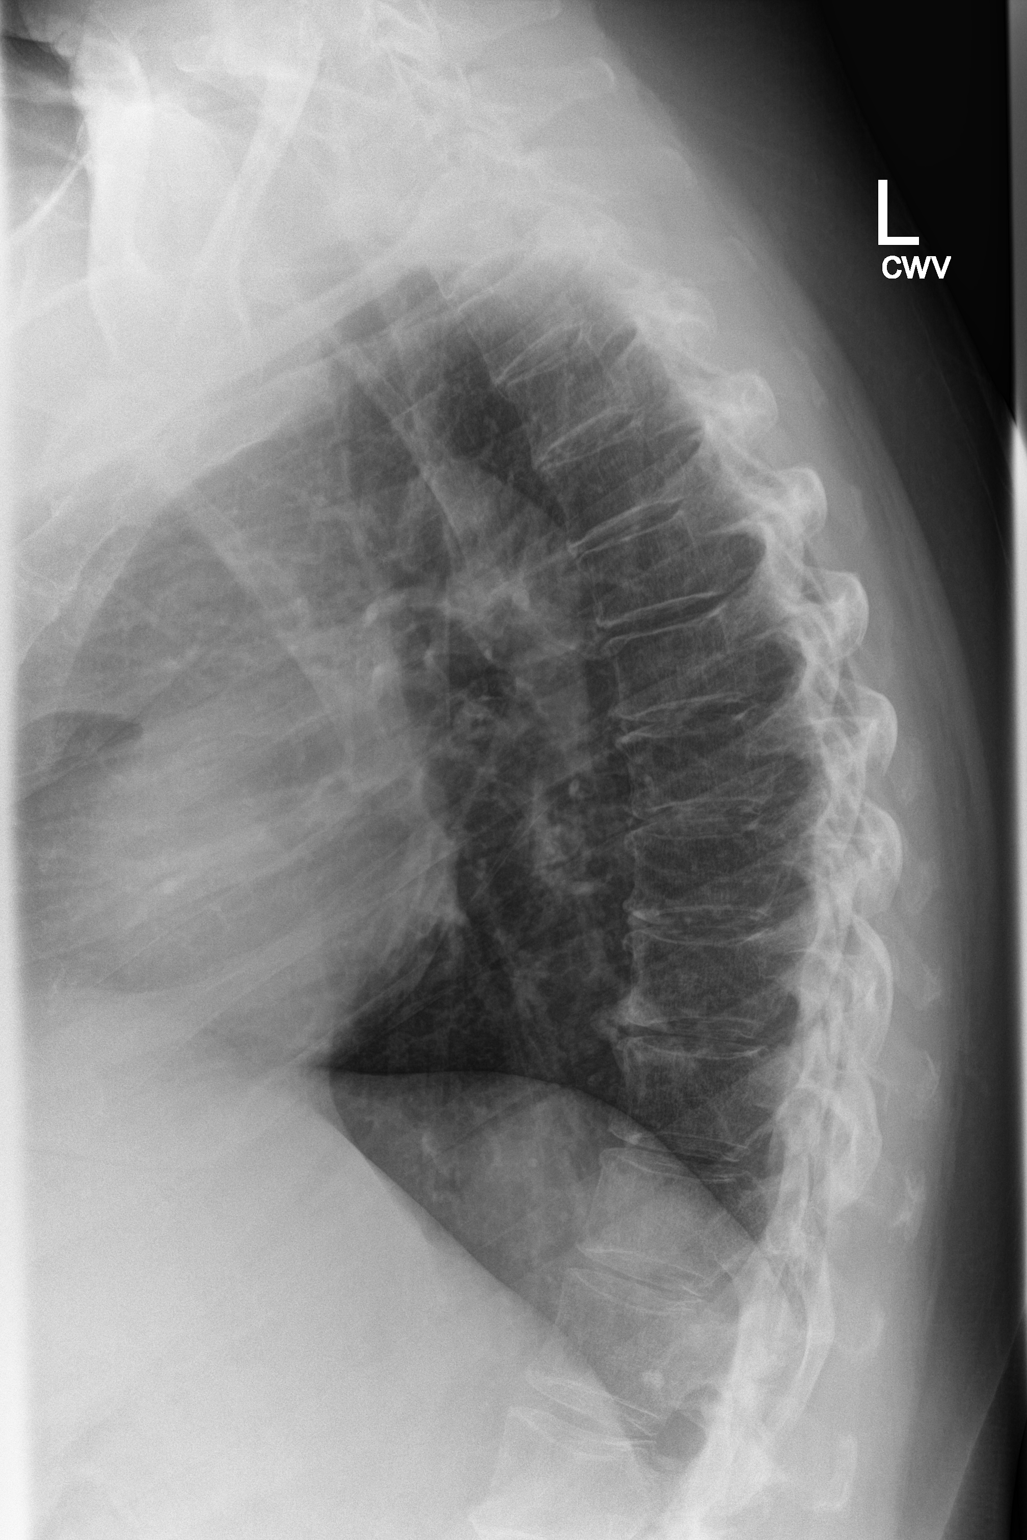
[im 3/3]
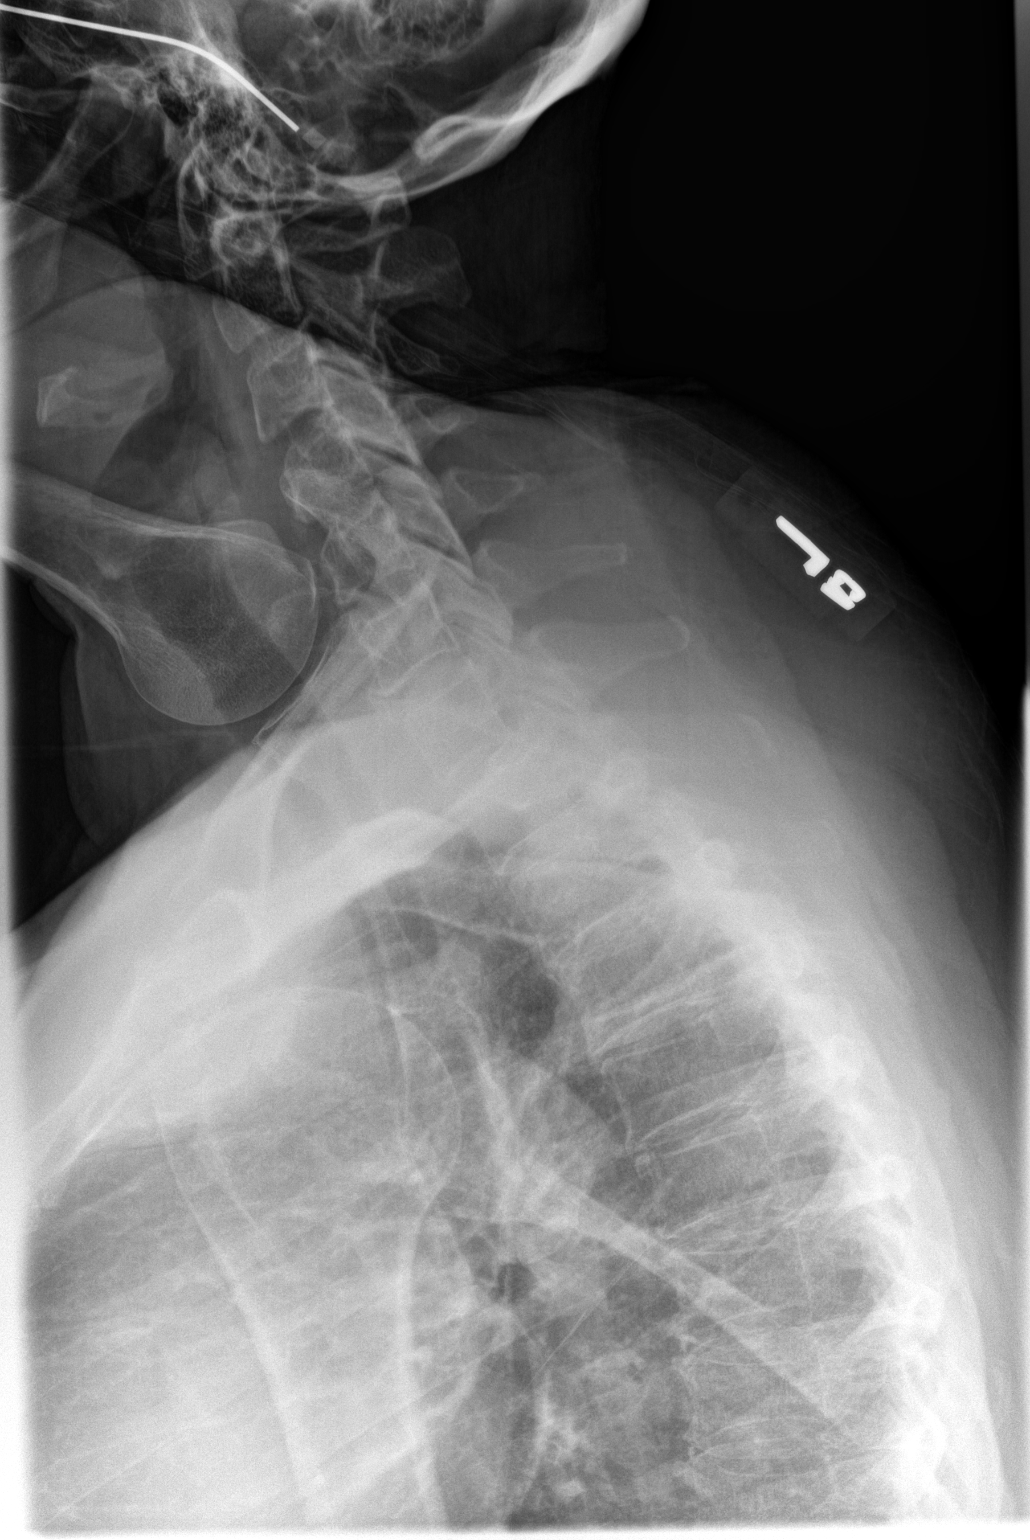

[3 of 3 positions shown; findings below may reference images not displayed]

FINDINGS: Three views of thoracic spine submitted. No acute fracture or
subluxation. Mild degenerative changes mid and lower thoracic spine.
IMPRESSION: No acute fracture or subluxation. Mild degenerative changes mid and
lower thoracic spine.

## 2015-04-30 MED ORDER — VERAPAMIL HCL ER 240 MG PO CP24: 240.0000 mg | ORAL_CAPSULE | Freq: Every day | ORAL | Status: DC

## 2015-04-30 MED ORDER — PIOGLITAZONE HCL 30 MG PO TABS: 30.0000 mg | ORAL_TABLET | Freq: Every day | ORAL | Status: DC

## 2015-04-30 MED ORDER — TRANDOLAPRIL 4 MG PO TABS: 4.0000 mg | ORAL_TABLET | Freq: Every day | ORAL | Status: DC

## 2015-04-30 MED ORDER — LOVASTATIN 20 MG PO TABS: 20.0000 mg | ORAL_TABLET | Freq: Every day | ORAL | Status: DC

## 2015-04-30 NOTE — Patient Instructions (Signed)
   Recommend taking 81mg enteric coated aspirin to reduce risk of vascular events such as heart attacks and strokes.    

## 2015-04-30 NOTE — Progress Notes (Signed)
Patient: Anna Sweeney Female    DOB: [birth year]   64 y.o.   MRN: [id number] Visit Date: 04/30/2015  Today's Provider: Mila Merry, MD   Chief Complaint  Patient presents with  . Follow-up    Medications  . Hypertension  . Diabetes  . Hyperlipidemia   Subjective:    HPI   Diabetes Mellitus Type II, Follow-up:   Lab Results  Component Value Date   HGBA1C 7.3* 10/29/2014   Last seen for diabetes 6 months ago.  Management since then includes none. She reports fair compliance with treatment. She is not having side effects. none Current symptoms include none and have been unchanged. Home blood sugar records: fasting range: when checked/150-200  Episodes of hypoglycemia? no   Current Insulin Regimen: n/a Most Recent Eye Exam: 1 1/2 years ago Weight trend: stable Prior visit with dietician: no Current diet: well balanced Current exercise: none  ------------------------------------------------------------------------   Hypertension, follow-up:  BP Readings from Last 3 Encounters:  04/30/15 110/60  12/03/14 130/64  11/15/13 124/66    She was last seen for hypertension 6 months ago.  BP at that visit was 118/66. Management since that visit includes none .She reports good compliance with treatment. She is not having side effects. none  She is not exercising. She is not adherent to low salt diet.   Outside blood pressures are n/a. She is experiencing none.  Patient denies none.   Cardiovascular risk factors include diabetes mellitus.  Use of agents associated with hypertension: none.   ------------------------------------------------------------------------    Lipid/Cholesterol, Follow-up:   Last seen for this 6 months ago.  Management since that visit includes none.  Last Lipid Panel:    Component Value Date/Time   CHOL 163 10/30/2014   TRIG 190* 10/30/2014   HDL 37 10/30/2014   LDLCALC 88 10/30/2014    She reports good compliance with  treatment. She is not having side effects. none  Wt Readings from Last 3 Encounters:  04/30/15 190 lb (86.183 kg)  12/03/14 188 lb (85.276 kg)  11/15/13 187 lb (84.823 kg)    ----------------------------------------------------------------------   Back pain She states she has had pain on the left side of mid back for months. It does not bother when she first gets up the morning, but gradually worsens throughout the day. It will usually improve when she takes Aleve, but she does not want to take this every day. Has tried taking muscle relaxers, but they make her too sleepy. Pain does not radiate into lower back or LEs. There are non associated urinary symptoms.   No Known Allergies Previous Medications   ALCOHOL SWABS (ALCOHOL PREP) 70 % PADS       ASPIRIN EC 81 MG TABLET    Take 81 mg by mouth daily.   ASSURE COMFORT LANCETS 30G MISC       GLIPIZIDE (GLUCOTROL XL) 10 MG 24 HR TABLET       METFORMIN (GLUCOPHAGE) 850 MG TABLET    Take 850 mg by mouth 2 (two) times daily with a meal.    ONE TOUCH ULTRA TEST TEST STRIP        Review of Systems  Constitutional: Negative for fever, chills, appetite change and fatigue.  Respiratory: Negative for chest tightness and shortness of breath.   Cardiovascular: Negative.  Negative for chest pain and palpitations.  Gastrointestinal: Negative for nausea, vomiting and abdominal pain.  Neurological: Negative for dizziness, weakness, light-headedness and headaches.    Social History  Substance Use Topics  . Smoking status: Former Smoker -- 1.00 packs/day for 4 years  . Smokeless tobacco: Not on file     Comment: quit in 1991  . Alcohol Use: No   Objective:   BP 110/60 mmHg  Pulse 79  Temp(Src) 98.5 F (36.9 C) (Oral)  Resp 16  Wt 190 lb (86.183 kg)  SpO2 96%  Physical Exam  General Appearance:    Alert, cooperative, no distress, obese  Eyes:    PERRL, conjunctiva/corneas clear, EOM's intact       Lungs:     Clear to auscultation  bilaterally, respirations unlabored  Heart:    Regular rate and rhythm  Neurologic:   Awake, alert, oriented x 3. No apparent focal neurological           defect.   MS:   Tender left mid parathoraic musculature. Minimal muscle swelling. No erythema. No spine tenderness.        Assessment & Plan:     1. Type 2 diabetes mellitus without complication Uncontrolled. Counseled extensively on diet and exercise changes to help control blood sugars. Counseled to check sugar at least once a day to track how her sugars change after starting pioglitazone.  Start pioglitazone  daily, #90, no refill - POCT glycosylated hemoglobin (Hb A1C)  2. High cholesterol She is tolerating lovastatin well with no adverse effects.    3. Essential hypertension well controlled Continue current medications.     4. Left-sided thoracic back pain Likely chronic muscle strain. Advised she may take occasional Aleve. See how xrays look. Can also use heat pad. Consider message therapy or PT.  - DG Ribs Unilateral Left; Future - DG Thoracic Spine 2 View; Future  Addressed extensive list of chronic and acute medical problems today requiring extensive time in counseling and coordination care.  Over half of this 45 minute visit were spent in counseling and coordinating care of multiple medical problems.     Return in about 8-10 weeks to see how addition of pioglitazone is working.    Mila Merry, MD  [facility]

## 2015-05-02 ENCOUNTER — Telehealth: Payer: Self-pay | Admitting: Family Medicine

## 2015-05-02 ENCOUNTER — Telehealth: Payer: Self-pay

## 2015-05-02 DIAGNOSIS — R911 Solitary pulmonary nodule: Secondary | ICD-10-CM

## 2015-05-02 NOTE — Telephone Encounter (Signed)
Advised patient of xray results. Please schedule patient for chest CT for evaluation of lung nodule. Thanks!

## 2015-05-02 NOTE — Telephone Encounter (Signed)
-----   Message from Malva Limes, MD sent at 04/30/2015  8:47 PM EDT ----- Herby Abraham shows small nodule left lung. Need further evaluation with CT scan. Please advise patient and forward to Maralyn Sago to schedule chest CT.

## 2015-05-07 ENCOUNTER — Other Ambulatory Visit: Payer: Self-pay | Admitting: Family Medicine

## 2015-05-07 NOTE — Telephone Encounter (Signed)
CT ordered. 

## 2015-05-07 NOTE — Telephone Encounter (Signed)
Please add referral to EPIC °

## 2015-05-12 ENCOUNTER — Ambulatory Visit
Admission: RE | Admit: 2015-05-12 | Discharge: 2015-05-12 | Disposition: A | Discharge status: Home / Self Care | Payer: Managed Care, Other (non HMO) | Source: Ambulatory Visit | Attending: Family Medicine | Admitting: Family Medicine

## 2015-05-12 DIAGNOSIS — R911 Solitary pulmonary nodule: Secondary | ICD-10-CM | POA: Insufficient documentation

## 2015-05-12 IMAGING — CT CT CHEST W/O CM
2 of 3 series · 15 of 36 positions shown, 18 images · non-contrast
Comparison: Chest x-ray [DATE].

CLINICAL DATA: Pulmonary nodule.

EXAM:
CT CHEST WITHOUT CONTRAST
TECHNIQUE: Multidetector CT imaging of the chest was performed following the
standard protocol without IV contrast.

[Series 2: routine chest wo · axial · 0.65mm/px · z∈[-774,-518]mm · 12 of 61 slices shown, 15 images]
[im 5/61  mediastinal]
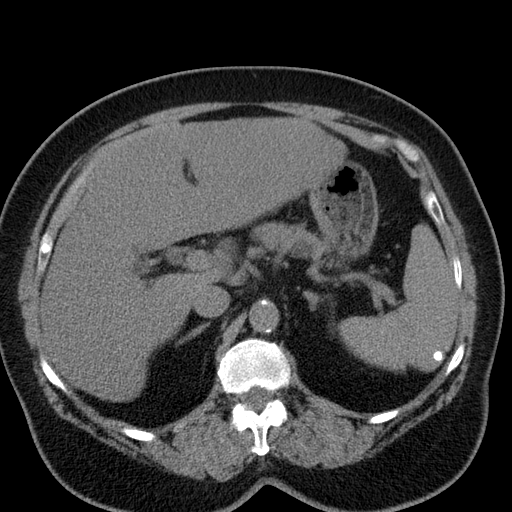
[im 5/61  lung]
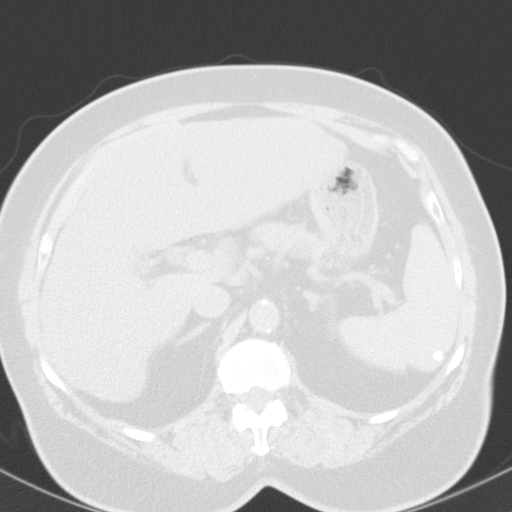
[im 9/61  lung]
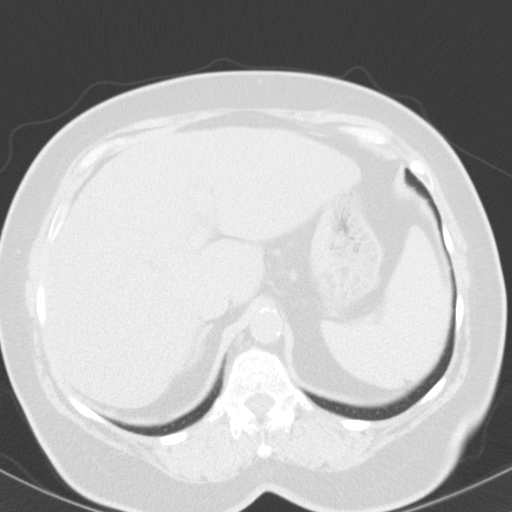
[im 14/61  lung]
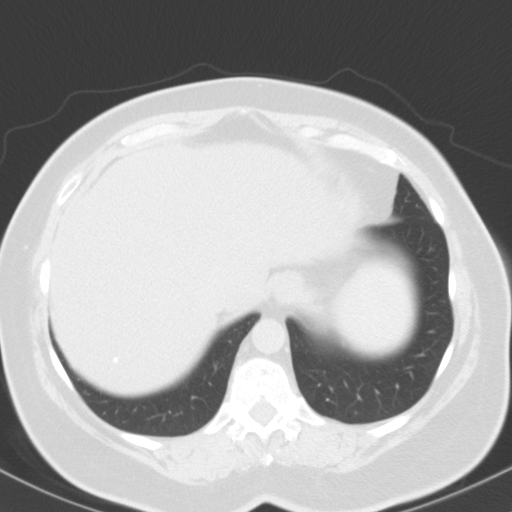
[im 18/61  lung]
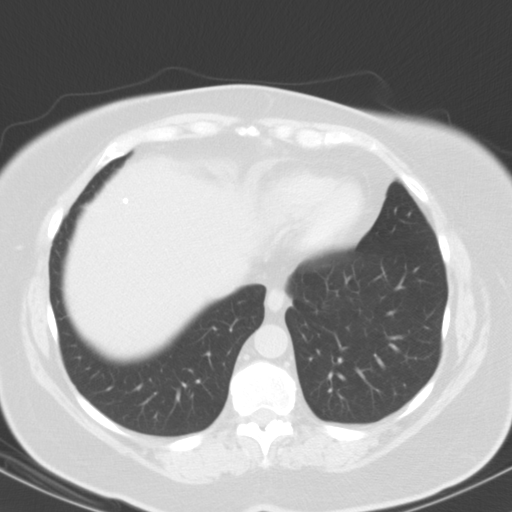
[im 23/61  mediastinal]
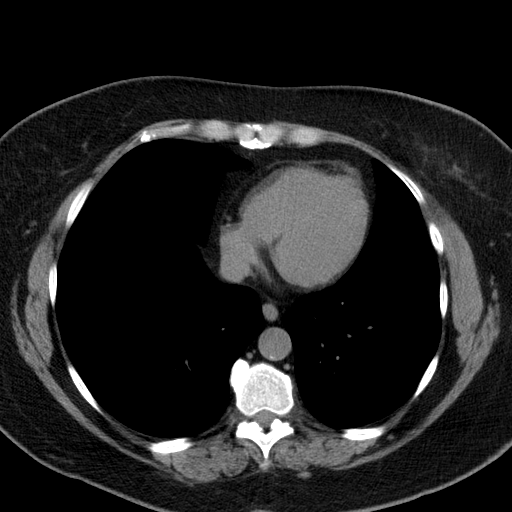
[im 23/61  lung]
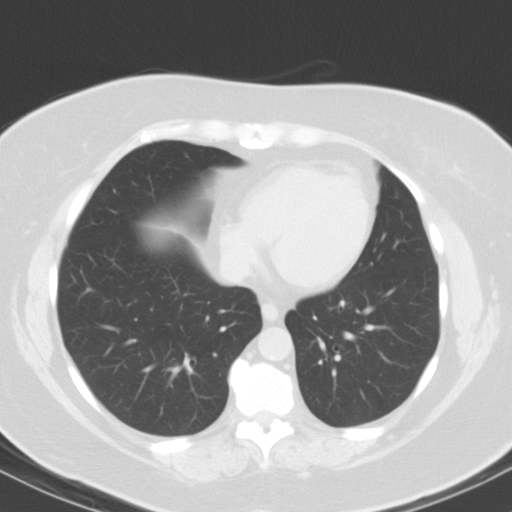
[im 27/61  lung]
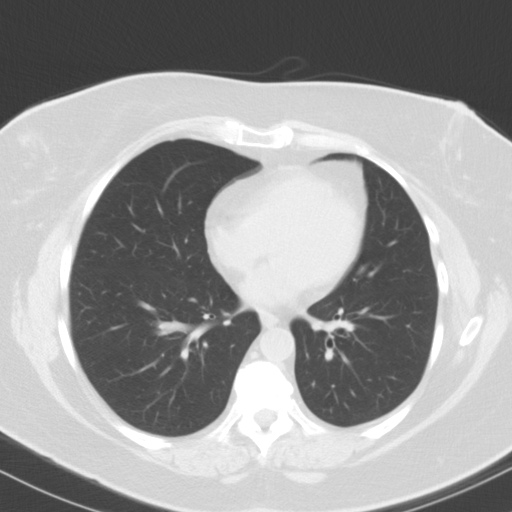
[im 34/61  lung]
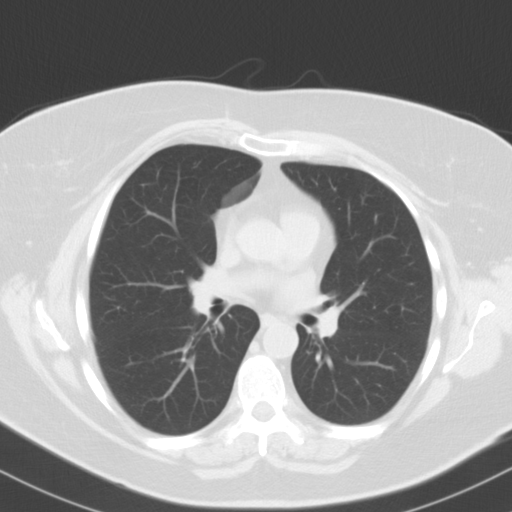
[im 38/61  lung]
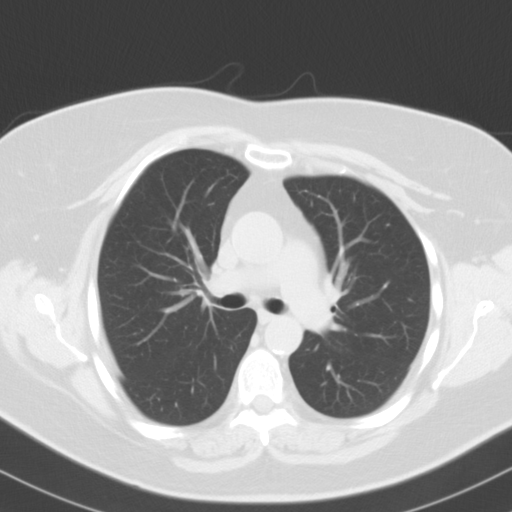
[im 43/61  mediastinal]
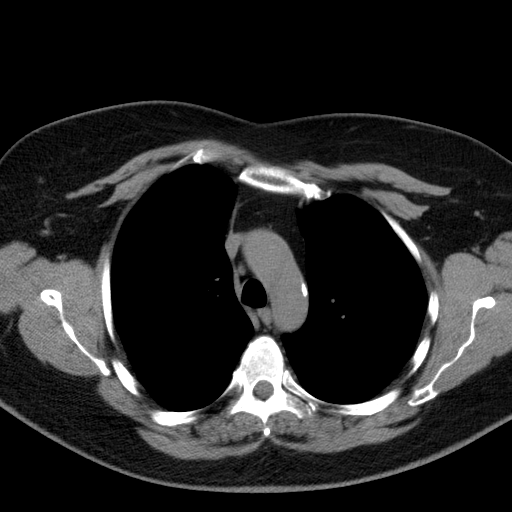
[im 43/61  lung]
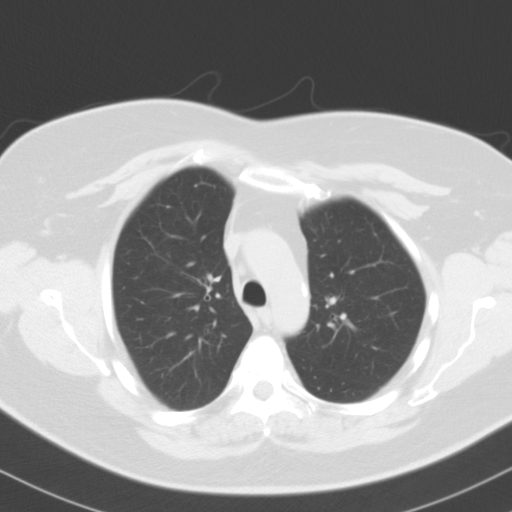
[im 47/61  lung]
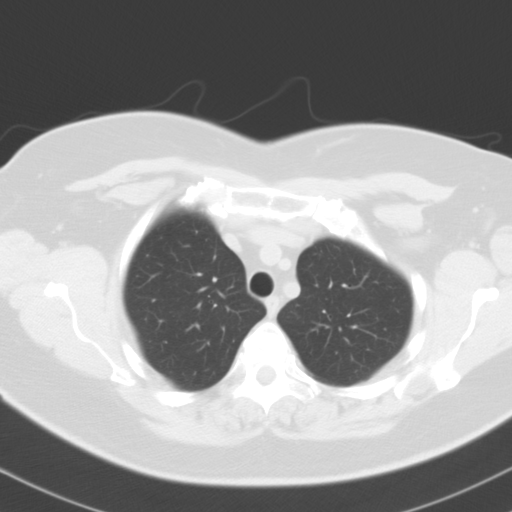
[im 52/61  lung]
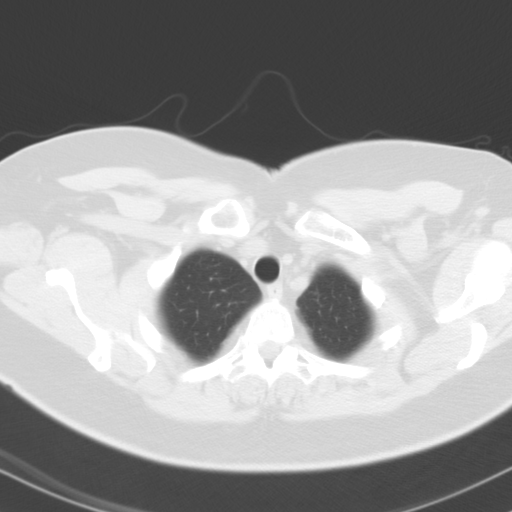
[im 56/61  lung]
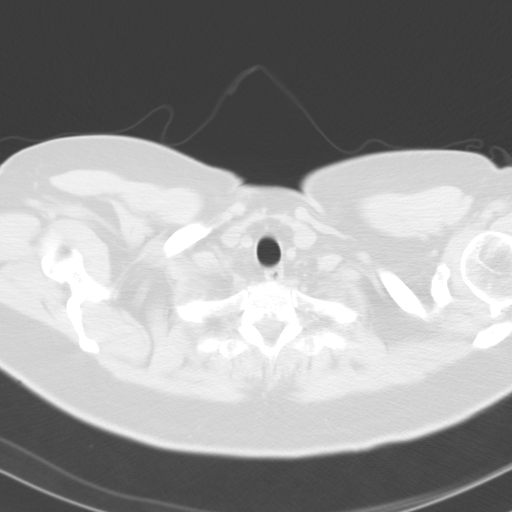

[Series 5: cor routine chest wo · coronal · 0.60mm/px · 3 of 140 slices shown]
[im 28/140  lung]
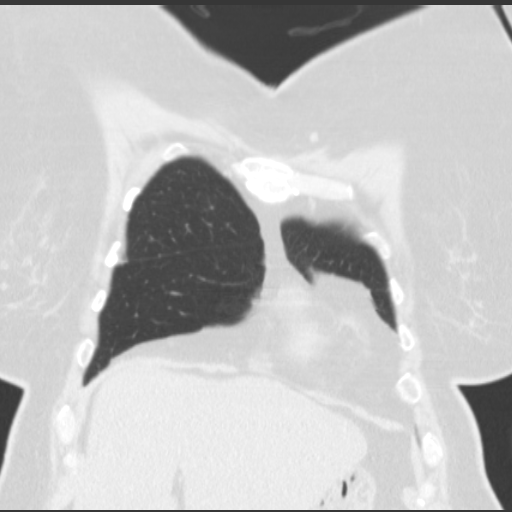
[im 56/140  lung]
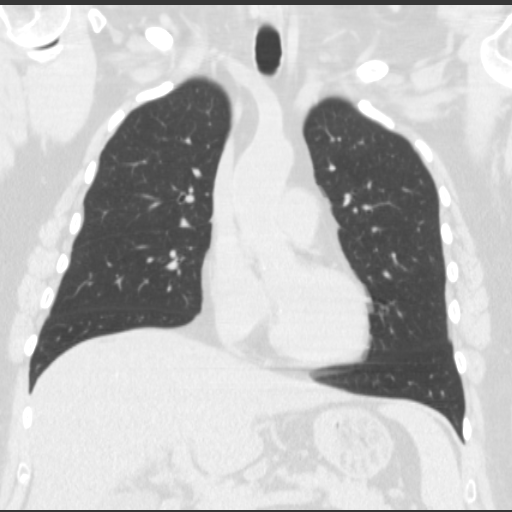
[im 84/140  lung]
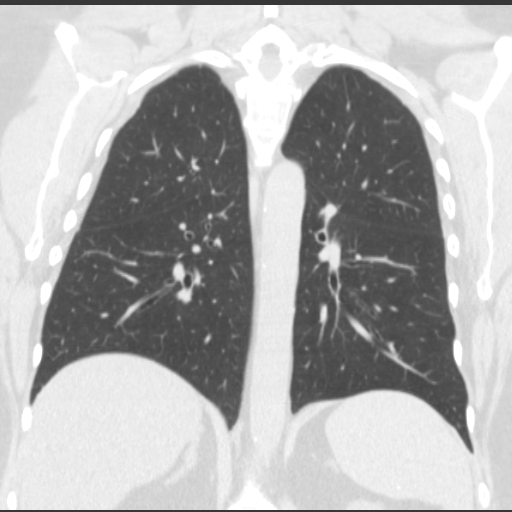

[15 of 36 positions shown; findings below may reference images not displayed]

FINDINGS: A 4 mm pulmonary nodule is evident at the left costophrenic angle.
No other focal nodule, mass, or airspace disease is present.

Granulomatous calcifications are present in the spleen and liver.
Limited imaging of the upper abdomen is otherwise unremarkable. Mild
atherosclerotic calcifications are present in the aortic arch and
descending aorta without aneurysm. There is no significant
mediastinal or axillary adenopathy. Heart size is normal. The
thoracic inlet is within normal limits.
IMPRESSION: 1. 4 mm pulmonary nodule at the left costophrenic angle. If the
patient is at high risk for bronchogenic carcinoma, follow-up chest
CT at 1 year is recommended. If the patient is at low risk, no
follow-up is needed. This recommendation follows the consensus
statement: Guidelines for Management of Small Pulmonary Nodules
Detected on CT Scans: A Statement from the [HOSPITAL] as
published in Radiology [4Y]; [DATE].
2. Evidence of prior granulomatous disease.
3. Atherosclerosis.

## 2015-05-13 ENCOUNTER — Telehealth: Payer: Self-pay

## 2015-05-13 DIAGNOSIS — R911 Solitary pulmonary nodule: Secondary | ICD-10-CM | POA: Insufficient documentation

## 2015-05-13 NOTE — Telephone Encounter (Signed)
Left message to call back  

## 2015-05-13 NOTE — Telephone Encounter (Signed)
-----   Message from Malva Limes, MD sent at 05/13/2015  8:04 AM EDT ----- A small benign appearing lung nodule is seen in left lung. Guidelines are to repeat in 1 year to ensure that it does not change.

## 2015-05-13 NOTE — Telephone Encounter (Signed)
Patient advised of results and verbally voiced understanding.  

## 2015-07-04 ENCOUNTER — Telehealth: Payer: Self-pay | Admitting: Family Medicine

## 2015-07-04 NOTE — Telephone Encounter (Signed)
Please triage. Also, has she had flu vaccine. Thanks.

## 2015-07-04 NOTE — Telephone Encounter (Signed)
Pt's brother (possibly) has the flu. He has not been to the dr.  He lives with her and she has not had the vaccine.  She wants to know if we would call in Tamiflu for per for prevention.  [facility] [place].  She is a patient of Dr. Sherrie MustacheFisher.  Her call back is [phone]  Thanks teri

## 2015-07-04 NOTE — Telephone Encounter (Signed)
Please advise. Thanks.  

## 2015-07-17 ENCOUNTER — Encounter: Payer: Self-pay | Admitting: Family Medicine

## 2015-07-17 ENCOUNTER — Ambulatory Visit (INDEPENDENT_AMBULATORY_CARE_PROVIDER_SITE_OTHER): Payer: Managed Care, Other (non HMO) | Admitting: Family Medicine

## 2015-07-17 VITALS — BP 104/60 | HR 76 | Temp 97.8°F | Resp 16 | Wt 179.0 lb

## 2015-07-17 DIAGNOSIS — Z7189 Other specified counseling: Secondary | ICD-10-CM | POA: Diagnosis not present

## 2015-07-17 DIAGNOSIS — Z23 Encounter for immunization: Secondary | ICD-10-CM | POA: Diagnosis not present

## 2015-07-17 DIAGNOSIS — I10 Essential (primary) hypertension: Secondary | ICD-10-CM | POA: Diagnosis not present

## 2015-07-17 LAB — POCT UA - MICROALBUMIN: MICROALBUMIN (UR) POC: 20 mg/L

## 2015-07-17 LAB — POCT GLYCOSYLATED HEMOGLOBIN (HGB A1C)
ESTIMATED AVERAGE GLUCOSE: 154
HEMOGLOBIN A1C: 7

## 2015-07-17 NOTE — Progress Notes (Signed)
Patient: Anna Sweeney Female    DOB: Mar 21, 1951   64 y.o.   MRN: [id number] Visit Date: 07/17/2015  Today's Provider: Mila Merry, MD   Chief Complaint  Patient presents with  . Diabetes    follow up   Subjective:    HPI   Diabetes Mellitus Type II, Follow-up:   Lab Results  Component Value Date   HGBA1C 7.0 07/17/2015   HGBA1C 10.4 04/30/2015   HGBA1C 7.3* 10/29/2014    Last seen for diabetes 10 weeks ago.  Management since then includes starting Pioglitazone  daily. Patient was advised to chece blood sugar s once a day to track how her sugars are doing.  Patient only took 2 pills of the Pioglitazone then stopped due to it causing dizziness. Home blood sugar records: not been checked recently. was running in the 170-180 range a few weeks ago  Episodes of hypoglycemia? no   Current Insulin Regimen: none Most Recent Eye Exam: 1 year ago Weight trend: decreasing steadily Prior visit with dietician: no Current diet: in general, a "healthy" diet   Current exercise: none  Pertinent Labs:    Component Value Date/Time   CHOL 163 10/30/2014   TRIG 190* 10/30/2014   HDL 37 10/30/2014   LDLCALC 88 10/30/2014   CREATININE 1.3* 10/30/2014    Wt Readings from Last 3 Encounters:  07/17/15 179 lb (81.194 kg)  04/30/15 190 lb (86.183 kg)  12/03/14 188 lb (85.276 kg)    ------------------------------------------------------------------------       No Known Allergies Previous Medications   ALCOHOL SWABS (ALCOHOL PREP) 70 % PADS       ASPIRIN EC 81 MG TABLET    Take 81 mg by mouth daily.   ASSURE COMFORT LANCETS 30G MISC       GLIPIZIDE (GLUCOTROL XL) 10 MG 24 HR TABLET    Take 10 mg by mouth daily.    LOVASTATIN (MEVACOR) 20 MG TABLET    Take 1 tablet (20 mg total) by mouth at bedtime.   METFORMIN (GLUCOPHAGE) 850 MG TABLET    Take 850 mg by mouth 2 (two) times daily with a meal.    ONE TOUCH ULTRA TEST TEST STRIP       TRANDOLAPRIL (MAVIK) 4 MG TABLET     Take 1 tablet (4 mg total) by mouth daily.   VERAPAMIL (VERELAN PM) 240 MG 24 HR CAPSULE    Take 1 capsule (240 mg total) by mouth daily.    Review of Systems  Constitutional: Negative for fever, chills, appetite change and fatigue.  Respiratory: Negative for chest tightness and shortness of breath.   Cardiovascular: Negative for chest pain and palpitations.  Gastrointestinal: Negative for nausea, vomiting and abdominal pain.  Neurological: Negative for dizziness and weakness.    Social History  Substance Use Topics  . Smoking status: Former Smoker -- 1.00 packs/day for 4 years  . Smokeless tobacco: Not on file     Comment: quit in 1991  . Alcohol Use: No   Objective:   BP 104/60 mmHg  Pulse 76  Temp(Src) 97.8 F (36.6 C) (Oral)  Resp 16  Wt 179 lb (81.194 kg)  SpO2 97%  Physical Exam    General Appearance:    Alert, cooperative, no distress, overweight  Eyes:    PERRL, conjunctiva/corneas clear, EOM's intact       Lungs:     Clear to auscultation bilaterally, respirations unlabored  Heart:    Regular rate and  rhythm  Neurologic:   Awake, alert, oriented x 3. No apparent focal neurological           defect.         Results for orders placed or performed in visit on 07/17/15  POCT HgB A1C  Result Value Ref Range   Hemoglobin A1C 7.0    Est. average glucose Bld gHb Est-mCnc 154        Assessment & Plan:     1. Diabetes education, encounter for [facility](HCC) Did not tolerate pioglitazone, but has done very well with improved diet. Continue current medications.  Return in the spring for follow up and labs.  - POCT HgB A1C  2. Need for influenza vaccination - Flu Vaccine QUAD 36+ mos IM  3. Essential hypertension Well controlled.  Continue current medications.         Mila Merryonald Maysen Sudol, MD  [facility]

## 2015-09-10 ENCOUNTER — Encounter: Payer: Self-pay | Admitting: *Deleted

## 2015-10-26 ENCOUNTER — Other Ambulatory Visit: Payer: Self-pay | Admitting: Family Medicine

## 2015-10-27 ENCOUNTER — Telehealth: Payer: Self-pay | Admitting: Family Medicine

## 2015-10-27 NOTE — Telephone Encounter (Signed)
Pt contacted office for refill request on the following medications: 1. GLIPIZIDE XL 10 MG 24 hr tablet   2. metFORMIN (GLUCOPHAGE) 850 MG tablet  Pt stated that she forgot to send the refill request to her mail order and just realized that she will run out of both medications on Wednesday 10/29/15. We have already sent new scripts to mail order but pt would like a week supply [facility] [place] to last until her mail order arrives. Please advise. Thanks TNP

## 2015-10-27 NOTE — Telephone Encounter (Signed)
Patient is requesting a week's supply be [facility] [place]. She will be out by Wednesday.

## 2015-10-29 MED ORDER — GLIPIZIDE ER 10 MG PO TB24: 10.0000 mg | ORAL_TABLET | Freq: Every day | ORAL | Status: DC

## 2015-10-29 MED ORDER — METFORMIN HCL 850 MG PO TABS: 850.0000 mg | ORAL_TABLET | Freq: Two times a day (BID) | ORAL | Status: DC

## 2015-10-29 NOTE — Telephone Encounter (Signed)
Pt is requesting the refill for a [facility] [place].    Thanks Barth Kirkseri

## 2015-11-03 ENCOUNTER — Ambulatory Visit (INDEPENDENT_AMBULATORY_CARE_PROVIDER_SITE_OTHER): Payer: Managed Care, Other (non HMO) | Admitting: Family Medicine

## 2015-11-03 ENCOUNTER — Encounter: Payer: Self-pay | Admitting: Family Medicine

## 2015-11-03 VITALS — BP 122/62 | HR 68 | Temp 97.9°F | Resp 16 | Wt 180.0 lb

## 2015-11-03 DIAGNOSIS — Z79899 Other long term (current) drug therapy: Secondary | ICD-10-CM | POA: Diagnosis not present

## 2015-11-03 DIAGNOSIS — E7800 Pure hypercholesterolemia, unspecified: Secondary | ICD-10-CM

## 2015-11-03 DIAGNOSIS — E119 Type 2 diabetes mellitus without complications: Secondary | ICD-10-CM | POA: Diagnosis not present

## 2015-11-03 DIAGNOSIS — I10 Essential (primary) hypertension: Secondary | ICD-10-CM | POA: Diagnosis not present

## 2015-11-03 LAB — POCT GLYCOSYLATED HEMOGLOBIN (HGB A1C)
ESTIMATED AVERAGE GLUCOSE: 148
HEMOGLOBIN A1C: 6.8

## 2015-11-03 NOTE — Progress Notes (Signed)
Patient: Anna Sweeney Female    DOB: 1951/06/11   65 y.o.   MRN: [id number] Visit Date: 11/03/2015  Today's Provider: Mila Merry, MD   Chief Complaint  Patient presents with  . Hypertension    follow up  . Diabetes    follow up  . Hyperlipidemia    follow up   Subjective:    HPI  Diabetes Mellitus Type II, Follow-up:   Lab Results  Component Value Date   HGBA1C 7.0 07/17/2015   HGBA1C 10.4 04/30/2015   HGBA1C 7.3* 10/29/2014   Last seen for diabetes 3 months ago.  Management since then includes no changes. She reports good compliance with treatment. She is not having side effects.  Current symptoms include none and have been stable. Home blood sugar records: fasting range: 70's  Episodes of hypoglycemia? no   Current Insulin Regimen: none Most Recent Eye Exam: 1 year ago. Has upcoming appointment 11/05/2015 Weight trend: stable Prior visit with dietician: no Current diet: in general, an "unhealthy" diet Current exercise: none  ------------------------------------------------------------------------   Hypertension, follow-up:  BP Readings from Last 3 Encounters:  11/03/15 122/62  07/17/15 104/60  04/30/15 110/60    She was last seen for hypertension 3 months ago.  BP at that visit was 104/60. Management since that visit includes no changes.She reports good compliance with treatment. She is not having side effects.  She is not exercising. She is adherent to low salt diet.   Outside blood pressures are not being checked. She is experiencing fatigue.  Patient denies chest pain, chest pressure/discomfort, claudication, dyspnea, exertional chest pressure/discomfort, irregular heart beat, lower extremity edema, near-syncope, orthopnea, palpitations, paroxysmal nocturnal dyspnea, syncope and tachypnea.   Cardiovascular risk factors include diabetes mellitus, dyslipidemia, hypertension and sedentary lifestyle.  Use of agents associated with  hypertension: NSAIDS.   ------------------------------------------------------------------------    Lipid/Cholesterol, Follow-up:   Last seen for this 6 months ago.  Management since that visit includes no changes.  Last Lipid Panel:    Component Value Date/Time   CHOL 163 10/30/2014   TRIG 190* 10/30/2014   HDL 37 10/30/2014   LDLCALC 88 10/30/2014    She reports good compliance with treatment. She is not having side effects.   Wt Readings from Last 3 Encounters:  11/03/15 180 lb (81.647 kg)  07/17/15 179 lb (81.194 kg)  04/30/15 190 lb (86.183 kg)    ------------------------------------------------------------------------      Allergies  Allergen Reactions  . Pioglitazone     Dizziness   Previous Medications   ALCOHOL SWABS (ALCOHOL PREP) 70 % PADS       ASPIRIN EC 81 MG TABLET    Take 81 mg by mouth daily.   ASSURE COMFORT LANCETS 30G MISC       GLIPIZIDE (GLIPIZIDE XL) 10 MG 24 HR TABLET    Take 1 tablet (10 mg total) by mouth daily.   LOVASTATIN (MEVACOR) 20 MG TABLET    Take 1 tablet (20 mg total) by mouth at bedtime.   METFORMIN (GLUCOPHAGE) 850 MG TABLET    Take 1 tablet (850 mg total) by mouth 2 (two) times daily.   ONE TOUCH ULTRA TEST TEST STRIP       TRANDOLAPRIL (MAVIK) 4 MG TABLET    Take 1 tablet (4 mg total) by mouth daily.   VERAPAMIL (VERELAN PM) 240 MG 24 HR CAPSULE    Take 1 capsule (240 mg total) by mouth daily.    Review of Systems  Constitutional: Positive for fatigue. Negative for fever, chills and appetite change.  Respiratory: Negative for chest tightness and shortness of breath.   Cardiovascular: Negative for chest pain and palpitations.  Gastrointestinal: Negative for nausea, vomiting and abdominal pain.  Neurological: Negative for dizziness and weakness.    Social History  Substance Use Topics  . Smoking status: Former Smoker -- 1.00 packs/day for 4 years  . Smokeless tobacco: Not on file     Comment: quit in 1991  . Alcohol  Use: No   Objective:   BP 122/62 mmHg  Pulse 68  Temp(Src) 97.9 F (36.6 C) (Oral)  Resp 16  Wt 180 lb (81.647 kg)  SpO2 98%  Physical Exam   General Appearance:    Alert, cooperative, no distress, overweight  Eyes:    PERRL, conjunctiva/corneas clear, EOM's intact       Lungs:     Clear to auscultation bilaterally, respirations unlabored  Heart:    Regular rate and rhythm  Neurologic:   Awake, alert, oriented x 3. No apparent focal neurological           defect.        Results for orders placed or performed in visit on 11/03/15  POCT HgB A1C  Result Value Ref Range   Hemoglobin A1C 6.8    Est. average glucose Bld gHb Est-mCnc 148        Assessment & Plan:     1. Controlled type 2 diabetes mellitus without complication, without long-term current use of insulin (HCC) Well controlled.  Continue current medications.  States scheduled for eye exam next week.  - POCT HgB A1C - Vitamin B12  2. High cholesterol She is tolerating lovastatin well with no adverse effects.   - Lipid panel  3. Essential hypertension Well controlled.  Continue current medications.   - Renal function panel  4. Long-term use of high-risk medication  - Vitamin B12       Mila Merryonald Ruvim Risko, MD  [facility]

## 2015-11-04 LAB — RENAL FUNCTION PANEL
ALBUMIN: 4.1 g/dL (ref 3.6–4.8)
BUN / CREAT RATIO: 14 (ref 11–26)
BUN: 18 mg/dL (ref 8–27)
CO2: 25 mmol/L (ref 18–29)
Calcium: 9.5 mg/dL (ref 8.7–10.3)
Chloride: 98 mmol/L (ref 96–106)
Creatinine, Ser: 1.33 mg/dL — ABNORMAL HIGH (ref 0.57–1.00)
GFR, EST AFRICAN AMERICAN: 49 mL/min/{1.73_m2} — AB (ref >59)
GFR, EST NON AFRICAN AMERICAN: 42 mL/min/{1.73_m2} — AB (ref >59)
Glucose: 210 mg/dL — ABNORMAL HIGH (ref 65–99)
POTASSIUM: 5 mmol/L (ref 3.5–5.2)
Phosphorus: 3.3 mg/dL (ref 2.5–4.5)
Sodium: 137 mmol/L (ref 134–144)

## 2015-11-04 LAB — LIPID PANEL
CHOL/HDL RATIO: 4.1 ratio (ref 0.0–4.4)
Cholesterol, Total: 152 mg/dL (ref 100–199)
HDL: 37 mg/dL — AB (ref >39)
LDL Calculated: 65 mg/dL (ref 0–99)
Triglycerides: 250 mg/dL — ABNORMAL HIGH (ref 0–149)
VLDL CHOLESTEROL CAL: 50 mg/dL — AB (ref 5–40)

## 2015-11-04 LAB — VITAMIN B12: VITAMIN B 12: 619 pg/mL (ref 211–946)

## 2015-11-27 ENCOUNTER — Encounter: Payer: Self-pay | Admitting: General Surgery

## 2015-12-02 ENCOUNTER — Ambulatory Visit: Payer: Managed Care, Other (non HMO) | Admitting: General Surgery

## 2015-12-18 ENCOUNTER — Ambulatory Visit (INDEPENDENT_AMBULATORY_CARE_PROVIDER_SITE_OTHER): Payer: Managed Care, Other (non HMO) | Admitting: General Surgery

## 2015-12-18 ENCOUNTER — Encounter: Payer: Self-pay | Admitting: General Surgery

## 2015-12-18 VITALS — BP 130/68 | HR 68 | Resp 12 | Ht 66.0 in | Wt 181.0 lb

## 2015-12-18 DIAGNOSIS — Z1231 Encounter for screening mammogram for malignant neoplasm of breast: Secondary | ICD-10-CM

## 2015-12-18 DIAGNOSIS — Z803 Family history of malignant neoplasm of breast: Secondary | ICD-10-CM | POA: Diagnosis not present

## 2015-12-18 NOTE — Patient Instructions (Signed)
Patient will be asked to return to the office in one year with a bilateral screening mammogram. 

## 2015-12-18 NOTE — Progress Notes (Signed)
Patient ID: Anna Sweeney, female   DOB: Apr 30, 1951, 65 y.o.   MRN: [id number]  Chief Complaint  Patient presents with  . Follow-up    mammogram    HPI Anna Sweeney is a 65 y.o. female who presents for a breast evaluation. The most recent mammogram was done on 11/27/15.  Patient does perform regular self breast checks and gets regular mammograms done.   I have reviewed the history of present illness with the patient. HPI  Past Medical History  Diagnosis Date  . History of chicken pox   . BRCA negative     Past Surgical History  Procedure Laterality Date  . Foot surgery      Family History  Problem Relation Age of Onset  . Breast cancer Mother   . Breast cancer Sister   . Cancer Sister     renal     Social History Social History  Substance Use Topics  . Smoking status: Former Smoker -- 1.00 packs/day for 4 years  . Smokeless tobacco: None     Comment: quit in 1991  . Alcohol Use: No    Allergies  Allergen Reactions  . Pioglitazone     Dizziness    Current Outpatient Prescriptions  Medication Sig Dispense Refill  . Alcohol Swabs (ALCOHOL PREP) 70 % PADS     . aspirin EC 81 MG tablet Take 81 mg by mouth daily.    . ASSURE COMFORT LANCETS 30G MISC     . glipiZIDE (GLIPIZIDE XL) 10 MG 24 hr tablet Take 1 tablet (10 mg total) by mouth daily. 7 tablet 0  . lovastatin (MEVACOR) 20 MG tablet Take 1 tablet (20 mg total) by mouth at bedtime. 90 tablet 3  . metFORMIN (GLUCOPHAGE) 850 MG tablet Take 1 tablet (850 mg total) by mouth 2 (two) times daily. 14 tablet 0  . ONE TOUCH ULTRA TEST test strip     . trandolapril (MAVIK) 4 MG tablet Take 1 tablet (4 mg total) by mouth daily. 90 tablet 3  . verapamil (VERELAN PM) 240 MG 24 hr capsule Take 1 capsule (240 mg total) by mouth daily. 90 capsule 3   No current facility-administered medications for this visit.    Review of Systems Review of Systems  Constitutional: Negative.   Respiratory: Negative.   Cardiovascular:  Negative.     Blood pressure 130/68, pulse 68, resp. rate 12, height 5' 6"  (1.676 m), weight 181 lb (82.101 kg).  Physical Exam Physical Exam  Constitutional: She is oriented to person, place, and time. She appears well-developed and well-nourished.  Eyes: Conjunctivae are normal. No scleral icterus.  Neck: Neck supple.  Cardiovascular: Normal rate, regular rhythm and normal heart sounds.   Pulmonary/Chest: Effort normal and breath sounds normal. Right breast exhibits no inverted nipple, no mass, no nipple discharge, no skin change and no tenderness. Left breast exhibits no inverted nipple, no mass, no nipple discharge, no skin change and no tenderness.  Abdominal: Soft. Normal appearance and bowel sounds are normal. There is no hepatomegaly. There is no tenderness. No hernia.  Lymphadenopathy:    She has no cervical adenopathy.    She has no axillary adenopathy.  Neurological: She is alert and oriented to person, place, and time.  Skin: Skin is warm and dry.    Data Reviewed Mammogram reviewed   Assessment    Family history of breast cancer. BRCA negative. Also she declines a screening colonoscopy. Option of Cologard discussed and she is agreeable.  Plan    Patient will be asked to return to the office in one year with a bilateral screening mammogram. PCP:  Caryn Section This information has been scribed by Gaspar Cola CMA.       Anna Sweeney G 12/18/2015, 11:37 AM

## 2016-02-12 LAB — COLOGUARD

## 2016-03-02 ENCOUNTER — Telehealth: Payer: Self-pay | Admitting: *Deleted

## 2016-03-02 NOTE — Telephone Encounter (Signed)
Notify patient that the cologuard test results were negative. F/U as scheduled.

## 2016-03-04 ENCOUNTER — Encounter: Payer: Self-pay | Admitting: General Surgery

## 2016-03-04 NOTE — Telephone Encounter (Signed)
Patient verbally understand results and is pleased.

## 2016-03-10 ENCOUNTER — Ambulatory Visit (INDEPENDENT_AMBULATORY_CARE_PROVIDER_SITE_OTHER): Payer: Managed Care, Other (non HMO) | Admitting: Family Medicine

## 2016-03-10 ENCOUNTER — Encounter: Payer: Self-pay | Admitting: Family Medicine

## 2016-03-10 VITALS — BP 120/60 | HR 72 | Temp 98.0°F | Resp 16 | Wt 183.0 lb

## 2016-03-10 DIAGNOSIS — E7800 Pure hypercholesterolemia, unspecified: Secondary | ICD-10-CM

## 2016-03-10 DIAGNOSIS — I10 Essential (primary) hypertension: Secondary | ICD-10-CM

## 2016-03-10 DIAGNOSIS — E119 Type 2 diabetes mellitus without complications: Secondary | ICD-10-CM | POA: Diagnosis not present

## 2016-03-10 DIAGNOSIS — L301 Dyshidrosis [pompholyx]: Secondary | ICD-10-CM

## 2016-03-10 LAB — POCT GLYCOSYLATED HEMOGLOBIN (HGB A1C)
ESTIMATED AVERAGE GLUCOSE: 163
HEMOGLOBIN A1C: 7.3

## 2016-03-10 MED ORDER — BETAMETHASONE DIPROPIONATE 0.05 % EX CREA: TOPICAL_CREAM | Freq: Two times a day (BID) | CUTANEOUS | 0 refills | Status: DC | PRN

## 2016-03-10 NOTE — Progress Notes (Signed)
Patient: Anna Sweeney Female    DOB: 1951/07/29   65 y.o.   MRN: [id number] Visit Date: 03/10/2016  Today's Provider: Mila Merry, MD   Chief Complaint  Patient presents with  . Diabetes    follow up  . Hyperlipidemia    follow up  . Hypertension    follow up   Subjective:    HPI  Diabetes Mellitus Type II, Follow-up:   Lab Results  Component Value Date   HGBA1C 6.8 11/03/2015   HGBA1C 7.0 07/17/2015   HGBA1C 10.4 04/30/2015   Last seen for diabetes 4 months ago.  Management since then includes no changes. She reports good compliance with treatment. She is not having side effects.  Current symptoms include none and have been stable. Home blood sugar records: not being checked  Episodes of hypoglycemia? no   Current Insulin Regimen: none Most Recent Eye Exam: <1 year Weight trend: fluctuating a bit Prior visit with dietician: no Current diet: in general, a "healthy" diet   Current exercise: none  ------------------------------------------------------------------------   Hypertension, follow-up:  BP Readings from Last 3 Encounters:  12/18/15 130/68  11/03/15 122/62  07/17/15 104/60    She was last seen for hypertension 4 months ago.  BP at that visit was 122/62. Management since that visit includes no changes.She reports good compliance with treatment. She is not having side effects.  She is not exercising. She is adherent to low salt diet.   Outside blood pressures are not being checked. She is experiencing none. Patient denies chest pain, chest pressure/discomfort, claudication, dyspnea, exertional chest pressure/discomfort, fatigue, irregular heart beat, lower extremity edema, near-syncope, orthopnea, palpitations, paroxysmal nocturnal dyspnea, syncope and tachypnea.   Cardiovascular risk factors include hypertension.  Use of agents associated with hypertension: NSAIDS.    ------------------------------------------------------------------------    Lipid/Cholesterol, Follow-up:   Last seen for this 4 months ago.  Management since that visit includes none.  Last Lipid Panel:    Component Value Date/Time   CHOL 152 11/03/2015 1134   TRIG 250 (H) 11/03/2015 1134   HDL 37 (L) 11/03/2015 1134   CHOLHDL 4.1 11/03/2015 1134   LDLCALC 65 11/03/2015 1134    She reports good compliance with treatment. She is not having side effects.   Wt Readings from Last 3 Encounters:  12/18/15 181 lb (82.1 kg)  11/03/15 180 lb (81.6 kg)  07/17/15 179 lb (81.2 kg)    ------------------------------------------------------------------------   Dyshidrotic eczema She has long history of dyshidrotic eczema previously prescribed betamethasone cream by Dr. Orson Aloe which has worked well, but is no flared up and she is out of cream.       Allergies  Allergen Reactions  . Pioglitazone     Dizziness   Current Meds  Medication Sig  . Alcohol Swabs (ALCOHOL PREP) 70 % PADS   . aspirin EC 81 MG tablet Take 81 mg by mouth daily.  . ASSURE COMFORT LANCETS 30G MISC   . glipiZIDE (GLIPIZIDE XL) 10 MG 24 hr tablet Take 1 tablet (10 mg total) by mouth daily.  Marland Kitchen lovastatin (MEVACOR) 20 MG tablet Take 1 tablet (20 mg total) by mouth at bedtime.  . metFORMIN (GLUCOPHAGE) 850 MG tablet Take 1 tablet (850 mg total) by mouth 2 (two) times daily.  . ONE TOUCH ULTRA TEST test strip   . trandolapril (MAVIK) 4 MG tablet Take 1 tablet (4 mg total) by mouth daily.  . verapamil (VERELAN PM) 240 MG 24 hr capsule Take  1 capsule (240 mg total) by mouth daily.    Review of Systems  Constitutional: Negative for appetite change, chills, fatigue and fever.  Respiratory: Negative for chest tightness and shortness of breath.   Cardiovascular: Negative for chest pain and palpitations.  Gastrointestinal: Negative for abdominal pain, nausea and vomiting.  Endocrine: Negative for cold  intolerance, heat intolerance, polydipsia, polyphagia and polyuria.  Neurological: Negative for dizziness and weakness.    Social History  Substance Use Topics  . Smoking status: Former Smoker    Packs/day: 1.00    Years: 4.00  . Smokeless tobacco: Never Used     Comment: quit in 1991  . Alcohol use No   Objective:   BP 120/60   Pulse 72   Temp 98 F (36.7 C) (Oral)   Resp 16   Wt 183 lb (83 kg)   SpO2 96% Comment: room air  BMI 29.54 kg/m   Physical Exam   General Appearance:    Alert, cooperative, no distress  Eyes:    PERRL, conjunctiva/corneas clear, EOM's intact       Lungs:     Clear to auscultation bilaterally, respirations unlabored  Heart:    Regular rate and rhythm  Neurologic:   Awake, alert, oriented x 3. No apparent focal neurological           defect.   Skin:   Thickened peeling tips of fingers of both hands c/w dyshidrotic eczema.      Results for orders placed or performed in visit on 03/10/16  POCT HgB A1C  Result Value Ref Range   Hemoglobin A1C 7.3    Est. average glucose Bld gHb Est-mCnc 163        Assessment & Plan:     1. Type 2 diabetes mellitus without complication, without long-term current use of insulin (HCC) A1c is increasing. Counseled on improving diet and increasing exercise. Follow up to recheck A1c in 4 months.  - POCT HgB A1C  2. Dyshidrotic eczema Start back on betamethasone which she has done will in the past. - betamethasone dipropionate (DIPROLENE) 0.05 % cream; Apply topically 2 (two) times daily as needed.  Dispense: 30 g; Refill: 0  3. Essential hypertension Well controlled.  Continue current medications.    4. High cholesterol She is tolerating lovastatin well with no adverse effects.         Mila Merry, MD  [facility]

## 2016-05-06 ENCOUNTER — Other Ambulatory Visit: Payer: Self-pay | Admitting: Family Medicine

## 2016-05-06 DIAGNOSIS — E7800 Pure hypercholesterolemia, unspecified: Secondary | ICD-10-CM

## 2016-05-06 DIAGNOSIS — I10 Essential (primary) hypertension: Secondary | ICD-10-CM

## 2016-05-06 NOTE — Telephone Encounter (Signed)
Refill request for lovastatin 20 mg qd Refill request for verapamil 240 mg qd Refill request for trandolapril 4 mg qd Last filled by MD on- 04/30/2015 #90 x3 Last Appt: 03/10/16 Next Appt: 07/09/16 Please advise refills?

## 2016-05-26 ENCOUNTER — Telehealth: Payer: Self-pay | Admitting: Family Medicine

## 2016-05-26 NOTE — Telephone Encounter (Signed)
Patient stated that she does does want to do the CT scan.

## 2016-05-26 NOTE — Telephone Encounter (Signed)
Please advise patient it is time to repeat CHEST CT WITHOUT CONTRAST since there was a small nodule in left lung on chest CT in October of 2017. Please send order to sarah to schedule after afvising patient. Thanks.

## 2016-07-09 ENCOUNTER — Ambulatory Visit (INDEPENDENT_AMBULATORY_CARE_PROVIDER_SITE_OTHER): Payer: Managed Care, Other (non HMO) | Admitting: Family Medicine

## 2016-07-09 ENCOUNTER — Encounter: Payer: Self-pay | Admitting: Family Medicine

## 2016-07-09 VITALS — BP 124/60 | HR 84 | Temp 98.2°F | Resp 16 | Ht 66.0 in | Wt 186.0 lb

## 2016-07-09 DIAGNOSIS — E7800 Pure hypercholesterolemia, unspecified: Secondary | ICD-10-CM | POA: Diagnosis not present

## 2016-07-09 DIAGNOSIS — E119 Type 2 diabetes mellitus without complications: Secondary | ICD-10-CM

## 2016-07-09 DIAGNOSIS — Z23 Encounter for immunization: Secondary | ICD-10-CM | POA: Diagnosis not present

## 2016-07-09 DIAGNOSIS — I10 Essential (primary) hypertension: Secondary | ICD-10-CM | POA: Diagnosis not present

## 2016-07-09 DIAGNOSIS — R911 Solitary pulmonary nodule: Secondary | ICD-10-CM | POA: Diagnosis not present

## 2016-07-09 LAB — POCT GLYCOSYLATED HEMOGLOBIN (HGB A1C)
Est. average glucose Bld gHb Est-mCnc: 177
Hemoglobin A1C: 7.8

## 2016-07-09 NOTE — Progress Notes (Signed)
Patient: Anna Sweeney Female    DOB: 12/17/1950   65 y.o.   MRN: [id number] Visit Date: 07/09/2016  Today's Provider: Mila Merryonald Isaura Schiller, MD   Chief Complaint  Patient presents with  . Follow-up  . Diabetes  . Hyperlipidemia  . Hypertension   Subjective:    HPI   Diabetes Mellitus Type II, Follow-up:   Lab Results  Component Value Date   HGBA1C 7.3 03/10/2016   HGBA1C 6.8 11/03/2015   HGBA1C 7.0 07/17/2015   Last seen for diabetes 4 months ago.  Management since then includes; advised to improve diet and exercise . She reports good compliance with treatment. She is not having side effects. none Current symptoms include none and have been unchanged. Home blood sugar records: fasting range: not checking  Episodes of hypoglycemia? no   Current Insulin Regimen: n/a Most Recent Eye Exam: 08/2015 Weight trend: stable Prior visit with dietician: no Current diet: in general, an "unhealthy" diet Current exercise: none  ------------------------------------------------------------------   Hypertension, follow-up:  BP Readings from Last 3 Encounters:  07/09/16 124/60  03/10/16 120/60  12/18/15 130/68    She was last seen for hypertension 4 months ago.  BP at that visit was 120/60. Management since that visit includes; no changes.She reports good compliance with treatment. She is not having side effects. none She is not exercising. She is adherent to low salt diet.   Outside blood pressures are not checking. She is experiencing none.  Patient denies none.   Cardiovascular risk factors include diabetes mellitus.  Use of agents associated with hypertension: none.   ------------------------------------------------------------------    Lipid/Cholesterol, Follow-up:   Last seen for this 4 months ago.  Management since that visit includes; no changes.  Last Lipid Panel:    Component Value Date/Time   CHOL 152 11/03/2015 1134   TRIG 250 (H) 11/03/2015 1134     HDL 37 (L) 11/03/2015 1134   CHOLHDL 4.1 11/03/2015 1134   LDLCALC 65 11/03/2015 1134    She reports good compliance with treatment. She is not having side effects. none  Wt Readings from Last 3 Encounters:  07/09/16 186 lb (84.4 kg)  03/10/16 183 lb (83 kg)  12/18/15 181 lb (82.1 kg)    ------------------------------------------------------------------    Allergies  Allergen Reactions  . Pioglitazone     Dizziness     Current Outpatient Prescriptions:  .  Alcohol Swabs (ALCOHOL PREP) 70 % PADS, , Disp: , Rfl:  .  aspirin EC 81 MG tablet, Take 81 mg by mouth daily., Disp: , Rfl:  .  ASSURE COMFORT LANCETS 30G MISC, , Disp: , Rfl:  .  betamethasone dipropionate (DIPROLENE) 0.05 % cream, Apply topically 2 (two) times daily as needed., Disp: 30 g, Rfl: 0 .  glipiZIDE (GLIPIZIDE XL) 10 MG 24 hr tablet, Take 1 tablet (10 mg total) by mouth daily., Disp: 7 tablet, Rfl: 0 .  lovastatin (MEVACOR) 20 MG tablet, TAKE 1 TABLET BY MOUTH (20 MG TOTAL) AT BEDTIME, Disp: 90 tablet, Rfl: 1 .  metFORMIN (GLUCOPHAGE) 850 MG tablet, Take 1 tablet (850 mg total) by mouth 2 (two) times daily., Disp: 14 tablet, Rfl: 0 .  ONE TOUCH ULTRA TEST test strip, , Disp: , Rfl:  .  trandolapril (MAVIK) 4 MG tablet, TAKE 1 TABLET BY MOUTH (4 MG TOTAL) DAILY, Disp: 90 tablet, Rfl: 1 .  verapamil (VERELAN PM) 240 MG 24 hr capsule, TAKE 1 CAPSULE BY MOUTH (240 MG TOTAL) DAILY, Disp:  90 capsule, Rfl: 1  Review of Systems  Constitutional: Negative for appetite change, chills, fatigue and fever.  Respiratory: Negative for chest tightness and shortness of breath.   Cardiovascular: Negative for chest pain and palpitations.  Gastrointestinal: Negative for abdominal pain, nausea and vomiting.  Neurological: Negative for dizziness and weakness.    Social History  Substance Use Topics  . Smoking status: Former Smoker    Packs/day: 1.00    Years: 4.00  . Smokeless tobacco: Never Used     Comment: quit in  1991  . Alcohol use No   Objective:   BP 124/60 (BP Location: Right Arm, Patient Position: Sitting, Cuff Size: Large)   Pulse 84   Temp 98.2 F (36.8 C) (Oral)   Resp 16   Ht 5\' 6"  (1.676 m)   Wt 186 lb (84.4 kg)   SpO2 97%   BMI 30.02 kg/m   Physical Exam  General Appearance:    Alert, cooperative, no distress, obese  Eyes:    PERRL, conjunctiva/corneas clear, EOM's intact       Lungs:     Clear to auscultation bilaterally, respirations unlabored  Heart:    Regular rate and rhythm  Neurologic:   Awake, alert, oriented x 3. No apparent focal neurological           defect.        Results for orders placed or performed in visit on 07/09/16  POCT glycosylated hemoglobin (Hb A1C)  Result Value Ref Range   Hemoglobin A1C 7.8    Est. average glucose Bld gHb Est-mCnc 177        Assessment & Plan:     1. Type 2 diabetes mellitus without complication, without long-term current use of insulin (HCC) Controlled but a1c is rising. Counseled to get more strict with diet and exercise more. Follow up in 4 months. Continue current medications.   - POCT glycosylated hemoglobin (Hb A1C)  2. Lung nodule Is due for follow up lung CT w/o contrast. She wants to due this after her insurance changes in January  3. Essential hypertension Well controlled.  Continue current medications.    4. High cholesterol She is tolerating lovastatin well with no adverse effects.    5. Need for influenza vaccination  - Flu Vaccine QUAD 36+ mos IM       Mila Merryonald Khale Nigh, MD  [facility]

## 2016-11-13 ENCOUNTER — Other Ambulatory Visit: Payer: Self-pay | Admitting: Family Medicine

## 2016-11-15 ENCOUNTER — Other Ambulatory Visit: Payer: Self-pay | Admitting: Physician Assistant

## 2016-11-15 DIAGNOSIS — E7800 Pure hypercholesterolemia, unspecified: Secondary | ICD-10-CM

## 2016-11-15 DIAGNOSIS — I10 Essential (primary) hypertension: Secondary | ICD-10-CM

## 2016-11-15 MED ORDER — TRANDOLAPRIL 4 MG PO TABS: ORAL_TABLET | ORAL | 4 refills | Status: DC

## 2016-11-15 MED ORDER — VERAPAMIL HCL ER 240 MG PO CP24: ORAL_CAPSULE | ORAL | 4 refills | Status: DC

## 2016-11-15 MED ORDER — LOVASTATIN 20 MG PO TABS: ORAL_TABLET | ORAL | 4 refills | Status: DC

## 2016-11-15 NOTE — Telephone Encounter (Signed)
For Dr Fisher 

## 2016-11-15 NOTE — Telephone Encounter (Addendum)
[facility] faxed a request of following medications. Thanks CC   trandolapril (MAVIK) 4 MG tablet  Take 1 Tablet by Mouth Daily.  lovastatin (MEVACOR) 20 MG tablet Take 1 Tablet by mouth at Bedtime.  verapamil (VERELAN PM) 240 MG 24 hr capsule Take 1 Capsule by mouth daily.

## 2016-11-16 ENCOUNTER — Ambulatory Visit (INDEPENDENT_AMBULATORY_CARE_PROVIDER_SITE_OTHER): Payer: Managed Care, Other (non HMO) | Admitting: Family Medicine

## 2016-11-16 ENCOUNTER — Encounter: Payer: Self-pay | Admitting: Family Medicine

## 2016-11-16 VITALS — BP 112/66 | HR 75 | Temp 97.8°F | Resp 16 | Wt 186.0 lb

## 2016-11-16 DIAGNOSIS — Z23 Encounter for immunization: Secondary | ICD-10-CM

## 2016-11-16 DIAGNOSIS — E119 Type 2 diabetes mellitus without complications: Secondary | ICD-10-CM | POA: Diagnosis not present

## 2016-11-16 DIAGNOSIS — E7800 Pure hypercholesterolemia, unspecified: Secondary | ICD-10-CM

## 2016-11-16 DIAGNOSIS — I10 Essential (primary) hypertension: Secondary | ICD-10-CM | POA: Diagnosis not present

## 2016-11-16 LAB — POCT GLYCOSYLATED HEMOGLOBIN (HGB A1C)
ESTIMATED AVERAGE GLUCOSE: 189
HEMOGLOBIN A1C: 8.2

## 2016-11-16 MED ORDER — TRANDOLAPRIL 4 MG PO TABS: ORAL_TABLET | ORAL | 4 refills | Status: DC

## 2016-11-16 MED ORDER — VERAPAMIL HCL ER 240 MG PO CP24: ORAL_CAPSULE | ORAL | 4 refills | Status: DC

## 2016-11-16 MED ORDER — METFORMIN HCL 1000 MG PO TABS: 1000.0000 mg | ORAL_TABLET | Freq: Two times a day (BID) | ORAL | 3 refills | Status: DC

## 2016-11-16 MED ORDER — GLIPIZIDE ER 10 MG PO TB24: 10.0000 mg | ORAL_TABLET | Freq: Every day | ORAL | 3 refills | Status: DC

## 2016-11-16 NOTE — Progress Notes (Signed)
Patient: Anna Sweeney Female    DOB: [birth year]   66 y.o.   MRN: [id number] Visit Date: 11/16/2016  Today's Provider: Mila Merry, MD   Chief Complaint  Patient presents with  . Diabetes    follow up  . Hypertension    follow up  . Hyperlipidemia    follow up   Subjective:    HPI  Diabetes Mellitus Type II, Follow-up:   Lab Results  Component Value Date   HGBA1C 7.8 07/09/2016   HGBA1C 7.3 03/10/2016   HGBA1C 6.8 11/03/2015   Last seen for diabetes 4 months ago.  Management since then includes counseling patient to get more strict with diet and exercise. She reports good compliance with treatment. She is not having side effects.  Current symptoms include none and have been stable. Home blood sugar records: occasionally checks radom blood sugars. Last blood sugar reading was 166  Episodes of hypoglycemia? no   Current Insulin Regimen: none Most Recent Eye Exam: 1 year ago Weight trend: stable Prior visit with dietician: no Current diet: in general, an "unhealthy" diet Current exercise: none  ------------------------------------------------------------------------   Hypertension, follow-up:  BP Readings from Last 3 Encounters:  07/09/16 124/60  03/10/16 120/60  12/18/15 130/68    She was last seen for hypertension 4 months ago.  BP at that visit was 124/60. Management since that visit includes no changes.She reports good compliance with treatment. She is not having side effects.  She is not exercising. She is adherent to low salt diet.   Outside blood pressures are not being checked. She is experiencing none.  Patient denies chest pain, chest pressure/discomfort, claudication, dyspnea, exertional chest pressure/discomfort, fatigue, irregular heart beat, lower extremity edema, near-syncope, orthopnea, palpitations, paroxysmal nocturnal dyspnea, syncope and tachypnea.   Cardiovascular risk factors include advanced age (older than 38 for men, 38 for  women), diabetes mellitus, dyslipidemia and hypertension.  Use of agents associated with hypertension: NSAIDS.   ------------------------------------------------------------------------    Lipid/Cholesterol, Follow-up:   Last seen for this 4 months ago.  Management since that visit includes no changes.  Last Lipid Panel:    Component Value Date/Time   CHOL 152 11/03/2015 1134   TRIG 250 (H) 11/03/2015 1134   HDL 37 (L) 11/03/2015 1134   CHOLHDL 4.1 11/03/2015 1134   LDLCALC 65 11/03/2015 1134    She reports good compliance with treatment. She is not having side effects.   Wt Readings from Last 3 Encounters:  07/09/16 186 lb (84.4 kg)  03/10/16 183 lb (83 kg)  12/18/15 181 lb (82.1 kg)    ------------------------------------------------------------------------ Follow up of Lung Nodule:  Patient was last seen for this problem 4 months ago. Management during that visit includes advising patient that she was due for a follow up lung CT without contrast. Patient requested to wait until after her  Insurance changed in January of 2018. Today patient comes in reporting that she has not yet  Been scheduled to have this done. Radiologist recommendation was to repeat only if patient is at high risk. Patient reports she quit smoking completely in the 1990s and has no second hand smoke exposure. Should prefers not to repeat scan.     Allergies  Allergen Reactions  . Pioglitazone     Dizziness     Current Outpatient Prescriptions:  .  Alcohol Swabs (ALCOHOL PREP) 70 % PADS, , Disp: , Rfl:  .  aspirin EC 81 MG tablet, Take 81 mg by mouth daily.,  Disp: , Rfl:  .  ASSURE COMFORT LANCETS 30G MISC, , Disp: , Rfl:  .  betamethasone dipropionate (DIPROLENE) 0.05 % cream, Apply topically 2 (two) times daily as needed., Disp: 30 g, Rfl: 0 .  glipiZIDE (GLUCOTROL XL) 10 MG 24 hr tablet, TAKE 1 TABLET BY MOUTH DAILY, Disp: 90 tablet, Rfl: 3 .  lovastatin (MEVACOR) 20 MG tablet, TAKE 1 TABLET  BY MOUTH (20 MG TOTAL) AT BEDTIME, Disp: 90 tablet, Rfl: 4 .  metFORMIN (GLUCOPHAGE) 850 MG tablet, TAKE 1 TABLET BY MOUTH TWICE DAILY, Disp: 180 tablet, Rfl: 3 .  ONE TOUCH ULTRA TEST test strip, , Disp: , Rfl:  .  trandolapril (MAVIK) 4 MG tablet, TAKE 1 TABLET BY MOUTH (4 MG TOTAL) DAILY, Disp: 90 tablet, Rfl: 4 .  verapamil (VERELAN PM) 240 MG 24 hr capsule, TAKE 1 CAPSULE BY MOUTH (240 MG TOTAL) DAILY, Disp: 90 capsule, Rfl: 4  Review of Systems  Constitutional: Negative for appetite change, chills, fatigue and fever.  HENT:       Right ear feels full   Respiratory: Negative for chest tightness and shortness of breath.   Cardiovascular: Negative for chest pain and palpitations.  Gastrointestinal: Negative for abdominal pain, nausea and vomiting.  Neurological: Negative for dizziness and weakness.    Social History  Substance Use Topics  . Smoking status: Former Smoker    Packs/day: 1.00    Years: 4.00  . Smokeless tobacco: Never Used     Comment: quit in 1991  . Alcohol use No   Objective:   BP 112/66 (BP Location: Left Arm, Patient Position: Sitting, Cuff Size: Large)   Pulse 75   Temp 97.8 F (36.6 C) (Oral)   Resp 16   Wt 186 lb (84.4 kg)   SpO2 96% Comment: room air  BMI 30.02 kg/m  There were no vitals filed for this visit.   Physical Exam  General Appearance:    Alert, cooperative, no distress, obese  Eyes:    PERRL, conjunctiva/corneas clear, EOM's intact       Lungs:     Clear to auscultation bilaterally, respirations unlabored  Heart:    Regular rate and rhythm  Neurologic:   Awake, alert, oriented x 3. No apparent focal neurological           defect.        Results for orders placed or performed in visit on 11/16/16  POCT HgB A1C  Result Value Ref Range   Hemoglobin A1C 8.2    Est. average glucose Bld gHb Est-mCnc 189        Assessment & Plan:     1. Type 2 diabetes mellitus without complication, without long-term current use of insulin  (HCC) Up a bit. Counseled to improve diet. She is very physically active with job. Increase metformin to 1000 BID.  - POCT HgB A1C - metFORMIN (GLUCOPHAGE) 1000 MG tablet; Take 1 tablet (1,000 mg total) by mouth 2 (two) times daily.  Dispense: 180 tablet; Refill: 3 - glipiZIDE (GLUCOTROL XL) 10 MG 24 hr tablet; Take 1 tablet (10 mg total) by mouth daily.  Dispense: 90 tablet; Refill: 3 - Renal function panel  2. Need for pneumococcal vaccine  - Pneumococcal conjugate vaccine 13-valent  3. Essential hypertension Well controlled.  Continue current medications.   - trandolapril (MAVIK) 4 MG tablet; TAKE 1 TABLET BY MOUTH (4 MG TOTAL) DAILY  Dispense: 90 tablet; Refill: 4 - verapamil (VERELAN PM) 240 MG 24 hr capsule; TAKE  1 CAPSULE BY MOUTH (240 MG TOTAL) DAILY  Dispense: 90 capsule; Refill: 4 - Renal function panel  4. High cholesterol She is tolerating lovastatin well with no adverse effects.  Needs refill if chol at goal.  - Lipid panel - Comprehensive metabolic panel  5. Low risk for lung cancel. Per guidelines and patient preference, follow up CT not ordered.       Mila Merry, MD  [facility]

## 2016-11-17 ENCOUNTER — Encounter: Payer: Self-pay | Admitting: Family Medicine

## 2016-11-17 DIAGNOSIS — R740 Nonspecific elevation of levels of transaminase and lactic acid dehydrogenase [LDH]: Secondary | ICD-10-CM

## 2016-11-17 DIAGNOSIS — R7401 Elevation of levels of liver transaminase levels: Secondary | ICD-10-CM | POA: Insufficient documentation

## 2016-11-17 LAB — COMPREHENSIVE METABOLIC PANEL
ALBUMIN: 4.3 g/dL (ref 3.6–4.8)
ALT: 37 IU/L — AB (ref 0–32)
AST: 53 IU/L — ABNORMAL HIGH (ref 0–40)
Albumin/Globulin Ratio: 1.3 (ref 1.2–2.2)
Alkaline Phosphatase: 60 IU/L (ref 39–117)
BILIRUBIN TOTAL: 0.3 mg/dL (ref 0.0–1.2)
BUN/Creatinine Ratio: 20 (ref 12–28)
BUN: 31 mg/dL — AB (ref 8–27)
CALCIUM: 9.6 mg/dL (ref 8.7–10.3)
CO2: 19 mmol/L (ref 18–29)
Chloride: 102 mmol/L (ref 96–106)
Creatinine, Ser: 1.52 mg/dL — ABNORMAL HIGH (ref 0.57–1.00)
GFR calc Af Amer: 41 mL/min/{1.73_m2} — ABNORMAL LOW (ref >59)
GFR, EST NON AFRICAN AMERICAN: 36 mL/min/{1.73_m2} — AB (ref >59)
GLUCOSE: 142 mg/dL — AB (ref 65–99)
Globulin, Total: 3.3 g/dL (ref 1.5–4.5)
Potassium: 4.8 mmol/L (ref 3.5–5.2)
Sodium: 143 mmol/L (ref 134–144)
TOTAL PROTEIN: 7.6 g/dL (ref 6.0–8.5)

## 2016-11-17 LAB — LIPID PANEL
Chol/HDL Ratio: 4.6 ratio — ABNORMAL HIGH (ref 0.0–4.4)
Cholesterol, Total: 171 mg/dL (ref 100–199)
HDL: 37 mg/dL — ABNORMAL LOW (ref >39)
LDL Calculated: 90 mg/dL (ref 0–99)
TRIGLYCERIDES: 219 mg/dL — AB (ref 0–149)
VLDL Cholesterol Cal: 44 mg/dL — ABNORMAL HIGH (ref 5–40)

## 2016-11-17 LAB — RENAL FUNCTION PANEL: Phosphorus: 3.9 mg/dL (ref 2.5–4.5)

## 2016-11-18 NOTE — Progress Notes (Signed)
Advised  ED 

## 2016-12-16 ENCOUNTER — Ambulatory Visit: Payer: Managed Care, Other (non HMO) | Admitting: General Surgery

## 2016-12-23 ENCOUNTER — Encounter: Payer: Self-pay | Admitting: General Surgery

## 2016-12-28 ENCOUNTER — Ambulatory Visit (INDEPENDENT_AMBULATORY_CARE_PROVIDER_SITE_OTHER): Payer: Managed Care, Other (non HMO) | Admitting: General Surgery

## 2016-12-28 ENCOUNTER — Encounter: Payer: Self-pay | Admitting: General Surgery

## 2016-12-28 VITALS — BP 140/7 | HR 70 | Resp 14 | Ht 66.0 in | Wt 176.0 lb

## 2016-12-28 DIAGNOSIS — Z1231 Encounter for screening mammogram for malignant neoplasm of breast: Secondary | ICD-10-CM | POA: Diagnosis not present

## 2016-12-28 DIAGNOSIS — Z803 Family history of malignant neoplasm of breast: Secondary | ICD-10-CM | POA: Diagnosis not present

## 2016-12-28 NOTE — Patient Instructions (Signed)
Patient will be asked to return to her PCP in one year with a bilateral screening mammogram and breast checks. The patient is aware to call back for any questions or concerns.   

## 2016-12-28 NOTE — Progress Notes (Signed)
Patient ID: Anna Sweeney, female   DOB: 14-Feb-1951, 66 y.o.   MRN: [id number]  Chief Complaint  Patient presents with  . Follow-up    mammogram    HPI Anna Sweeney is a 66 y.o. female who presents for a breast evaluation. The most recent mammogram was done on 12/22/2016. Cologuard was negative last year.  Patient does perform regular self breast checks and gets regular mammograms done.    HPI  Past Medical History:  Diagnosis Date  . BRCA negative   . History of chicken pox     Past Surgical History:  Procedure Laterality Date  . FOOT SURGERY      Family History  Problem Relation Age of Onset  . Breast cancer Mother   . Breast cancer Sister   . Cancer Sister        renal     Social History Social History  Substance Use Topics  . Smoking status: Former Smoker    Packs/day: 1.00    Years: 4.00  . Smokeless tobacco: Never Used     Comment: quit in 1991  . Alcohol use No    Allergies  Allergen Reactions  . Pioglitazone     Dizziness    Current Outpatient Prescriptions  Medication Sig Dispense Refill  . Alcohol Swabs (ALCOHOL PREP) 70 % PADS     . aspirin EC 81 MG tablet Take 81 mg by mouth daily.    . ASSURE COMFORT LANCETS 30G MISC     . betamethasone dipropionate (DIPROLENE) 0.05 % cream Apply topically 2 (two) times daily as needed. 30 g 0  . glipiZIDE (GLUCOTROL XL) 10 MG 24 hr tablet Take 1 tablet (10 mg total) by mouth daily. 90 tablet 3  . lovastatin (MEVACOR) 20 MG tablet TAKE 1 TABLET BY MOUTH (20 MG TOTAL) AT BEDTIME 90 tablet 4  . metFORMIN (GLUCOPHAGE) 1000 MG tablet Take 1 tablet (1,000 mg total) by mouth 2 (two) times daily. 180 tablet 3  . ONE TOUCH ULTRA TEST test strip     . trandolapril (MAVIK) 4 MG tablet TAKE 1 TABLET BY MOUTH (4 MG TOTAL) DAILY 90 tablet 4  . verapamil (VERELAN PM) 240 MG 24 hr capsule TAKE 1 CAPSULE BY MOUTH (240 MG TOTAL) DAILY 90 capsule 4  . metFORMIN (GLUCOPHAGE) 850 MG tablet      No current facility-administered  medications for this visit.     Review of Systems Review of Systems  Blood pressure (!) 140/7, pulse 70, resp. rate 14, height 5' 6"  (1.676 m), weight 176 lb (79.8 kg).  Physical Exam Physical Exam  Constitutional: She is oriented to person, place, and time. She appears well-developed and well-nourished.  Eyes: Conjunctivae are normal. No scleral icterus.  Neck: Neck supple.  Cardiovascular: Normal rate, regular rhythm and normal heart sounds.   Pulmonary/Chest: Effort normal and breath sounds normal. Right breast exhibits no inverted nipple, no mass, no nipple discharge, no skin change and no tenderness. Left breast exhibits no inverted nipple, no mass, no nipple discharge, no skin change and no tenderness.    Abdominal: Soft. Bowel sounds are normal. There is no tenderness.  Lymphadenopathy:    She has no cervical adenopathy.    She has no axillary adenopathy.  Neurological: She is alert and oriented to person, place, and time.  Skin: Skin is warm and dry.    Data Reviewed Mammogram reviewed -stable.  Assessment    FH of breast cancer. BRCA neg    Plan  Patient will be asked to return to her PCP in one year with a bilateral screening mammogram and breast checks. The patient is aware to call back for any questions or concerns.     HPI, Physical Exam, Assessment and Plan have been scribed under the direction and in the presence of Mckinley Jewel, MD  Gaspar Cola, CMA  I have completed the exam and reviewed the above documentation for accuracy and completeness.  I agree with the above.  Haematologist has been used and any errors in dictation or transcription are unintentional.  Christphor Groft G. Jamal Collin, M.D., F.A.C.S.   Junie Panning G 12/28/2016, 12:49 PM

## 2017-02-28 ENCOUNTER — Other Ambulatory Visit: Payer: Self-pay | Admitting: Family Medicine

## 2017-02-28 DIAGNOSIS — E7800 Pure hypercholesterolemia, unspecified: Secondary | ICD-10-CM

## 2017-02-28 DIAGNOSIS — E119 Type 2 diabetes mellitus without complications: Secondary | ICD-10-CM

## 2017-02-28 MED ORDER — GLIPIZIDE ER 10 MG PO TB24: 10.0000 mg | ORAL_TABLET | Freq: Every day | ORAL | 3 refills | Status: DC

## 2017-02-28 MED ORDER — LOVASTATIN 20 MG PO TABS: ORAL_TABLET | ORAL | 0 refills | Status: DC

## 2017-02-28 MED ORDER — LOVASTATIN 20 MG PO TABS: ORAL_TABLET | ORAL | 4 refills | Status: DC

## 2017-02-28 MED ORDER — METFORMIN HCL 1000 MG PO TABS: 1000.0000 mg | ORAL_TABLET | Freq: Two times a day (BID) | ORAL | 3 refills | Status: DC

## 2017-02-28 MED ORDER — METFORMIN HCL 1000 MG PO TABS: 1000.0000 mg | ORAL_TABLET | Freq: Two times a day (BID) | ORAL | 0 refills | Status: DC

## 2017-02-28 MED ORDER — GLIPIZIDE ER 10 MG PO TB24: 10.0000 mg | ORAL_TABLET | Freq: Every day | ORAL | 0 refills | Status: DC

## 2017-02-28 NOTE — Telephone Encounter (Signed)
See message below °

## 2017-02-28 NOTE — Telephone Encounter (Signed)
Pt contacted office for refill request on the following medications:  metFORMIN (GLUCOPHAGE) 1000 MG tablet-Pt is requesting this changed back to 850MG   glipiZIDE (GLUCOTROL XL) 10 MG 24 hr tablet   lovastatin (MEVACOR) 20 MG tablet   [facility] and a 90 day supply sent to [facility] mail order.  Pt states she completely out of these medications.  CB#[phone]/MW

## 2017-02-28 NOTE — Addendum Note (Signed)
Addended by: Marlene LardMILLER, Alexxa Sabet M on: 02/28/2017 03:47 PM   Modules accepted: Orders

## 2017-03-21 ENCOUNTER — Ambulatory Visit (INDEPENDENT_AMBULATORY_CARE_PROVIDER_SITE_OTHER): Payer: Managed Care, Other (non HMO) | Admitting: Family Medicine

## 2017-03-21 ENCOUNTER — Encounter: Payer: Self-pay | Admitting: Family Medicine

## 2017-03-21 VITALS — BP 128/82 | HR 82 | Resp 16 | Ht 66.0 in | Wt 175.0 lb

## 2017-03-21 DIAGNOSIS — E119 Type 2 diabetes mellitus without complications: Secondary | ICD-10-CM

## 2017-03-21 LAB — POCT GLYCOSYLATED HEMOGLOBIN (HGB A1C): Hemoglobin A1C: 6.6

## 2017-03-21 NOTE — Progress Notes (Signed)
Patient: Anna Sweeney Female    DOB: 07/24/1951   66 y.o.   MRN: [id number] Visit Date: 03/21/2017  Today's Provider: Mila Merry, MD   Chief Complaint  Patient presents with  . Diabetes    Has not been keeping record of BS readings.    Subjective:    HPI  Hypertension, follow-up:  BP Readings from Last 3 Encounters:  03/21/17 128/82  12/28/16 (!) 140/7  11/16/16 112/66    She was last seen for hypertension 4 months ago.  BP at that visit was 112/66. Management since that visit includes no changes. She reports good compliance with treatment. She is not having side effects.  She is exercising. She is adherent to low salt diet.   Outside blood pressures are no being checked. She is experiencing none.  Patient denies chest pain, chest pressure/discomfort, claudication, dyspnea, exertional chest pressure/discomfort, fatigue, irregular heart beat, lower extremity edema, near-syncope, orthopnea, palpitations, paroxysmal nocturnal dyspnea, syncope and tachypnea.   Cardiovascular risk factors include advanced age (older than 20 for men, 22 for women), diabetes mellitus, dyslipidemia and hypertension.  Use of agents associated with hypertension: NSAIDS.     Weight trend: stable Wt Readings from Last 3 Encounters:  03/21/17 175 lb (79.4 kg)  12/28/16 176 lb (79.8 kg)  11/16/16 186 lb (84.4 kg)    Current diet: well balanced  ------------------------------------------------------------------------  Diabetes Mellitus Type II, Follow-up:   Lab Results  Component Value Date   HGBA1C 8.2 11/16/2016   HGBA1C 7.8 07/09/2016   HGBA1C 7.3 03/10/2016    Last seen for diabetes 4 months ago.  Management since then includes increasing Metformin to 1,000mg  twice a day. She reports fair compliance with treatment. She states she didn't start the 1,000mg  metformin yet since she had plenty of the 850s. She has started eating healthier and is still taking 850mg  metformin twice  a day.  She is not having side effects.  Current symptoms include none and have been stable. Home blood sugar records: blood sugars are not being checked  Episodes of hypoglycemia? no   Current Insulin Regimen: none Most Recent Eye Exam: <1 year ago Weight trend: stable Prior visit with dietician: no Current diet: well balanced Current exercise: walking  Pertinent Labs:    Component Value Date/Time   CHOL 171 11/16/2016 1051   TRIG 219 (H) 11/16/2016 1051   HDL 37 (L) 11/16/2016 1051   LDLCALC 90 11/16/2016 1051   CREATININE 1.52 (H) 11/16/2016 1051    Wt Readings from Last 3 Encounters:  03/21/17 175 lb (79.4 kg)  12/28/16 176 lb (79.8 kg)  11/16/16 186 lb (84.4 kg)    ------------------------------------------------------------------------  Lipid/Cholesterol, Follow-up:   Last seen for this 4 months ago.  Management changes since that visit include none. . Last Lipid Panel:    Component Value Date/Time   CHOL 171 11/16/2016 1051   TRIG 219 (H) 11/16/2016 1051   HDL 37 (L) 11/16/2016 1051   CHOLHDL 4.6 (H) 11/16/2016 1051   LDLCALC 90 11/16/2016 1051    Risk factors for vascular disease include diabetes mellitus, hypercholesterolemia and hypertension  She reports good compliance with treatment. She is not having side effects.  Current symptoms include none and have been stable. Weight trend: stable Prior visit with dietician: no Current diet: well balanced Current exercise: walking  Wt Readings from Last 3 Encounters:  03/21/17 175 lb (79.4 kg)  12/28/16 176 lb (79.8 kg)  11/16/16 186 lb (84.4 kg)    -------------------------------------------------------------------  Allergies  Allergen Reactions  . Pioglitazone     Dizziness     Current Outpatient Prescriptions:  .  Alcohol Swabs (ALCOHOL PREP) 70 % PADS, , Disp: , Rfl:  .  aspirin EC 81 MG tablet, Take 81 mg by mouth daily., Disp: , Rfl:  .  ASSURE COMFORT LANCETS 30G MISC, , Disp: ,  Rfl:  .  betamethasone dipropionate (DIPROLENE) 0.05 % cream, Apply topically 2 (two) times daily as needed., Disp: 30 g, Rfl: 0 .  glipiZIDE (GLUCOTROL XL) 10 MG 24 hr tablet, Take 1 tablet (10 mg total) by mouth daily., Disp: 30 tablet, Rfl: 0 .  lovastatin (MEVACOR) 20 MG tablet, TAKE 1 TABLET BY MOUTH (20 MG TOTAL) AT BEDTIME, Disp: 30 tablet, Rfl: 0 .  metFORMIN (GLUCOPHAGE) 1000 MG tablet, Take 1 tablet (1,000 mg total) by mouth 2 (two) times daily., Disp: 60 tablet, Rfl: 0 .  ONE TOUCH ULTRA TEST test strip, , Disp: , Rfl:  .  trandolapril (MAVIK) 4 MG tablet, TAKE 1 TABLET BY MOUTH (4 MG TOTAL) DAILY, Disp: 90 tablet, Rfl: 4 .  verapamil (VERELAN PM) 240 MG 24 hr capsule, TAKE 1 CAPSULE BY MOUTH (240 MG TOTAL) DAILY, Disp: 90 capsule, Rfl: 4  Review of Systems  Constitutional: Negative for appetite change, chills, fatigue and fever.  Respiratory: Negative for chest tightness and shortness of breath.   Cardiovascular: Negative for chest pain and palpitations.  Gastrointestinal: Negative for abdominal pain, nausea and vomiting.  Neurological: Negative for dizziness and weakness.    Social History  Substance Use Topics  . Smoking status: Former Smoker    Packs/day: 1.00    Years: 4.00  . Smokeless tobacco: Never Used     Comment: quit in 1991  . Alcohol use No   Objective:   BP 128/82   Pulse 82   Resp 16   Ht 5\' 6"  (1.676 m)   Wt 175 lb (79.4 kg)   BMI 28.25 kg/m  Vitals:   03/21/17 1133  BP: 128/82  Pulse: 82  Resp: 16  Weight: 175 lb (79.4 kg)  Height: 5\' 6"  (1.676 m)     Physical Exam  General Appearance:    Alert, cooperative, no distress  Eyes:    PERRL, conjunctiva/corneas clear, EOM's intact       Lungs:     Clear to auscultation bilaterally, respirations unlabored  Heart:    Regular rate and rhythm  Neurologic:   Awake, alert, oriented x 3. No apparent focal neurological           defect.       Results for orders placed or performed in visit on  03/21/17  POCT HgB A1C  Result Value Ref Range   Hemoglobin A1C 6.6        Assessment & Plan:     1. Type 2 diabetes mellitus without complication, without long-term current use of insulin (HCC) Much better with dietary changes. She had a bottle of 1000mg  metformin at home which she is going to start. She will call if any problems. Otherwise follow up a1c in 3 months.  - POCT HgB A1C       Mila Merryonald Fisher, MD  [facility]

## 2017-04-03 ENCOUNTER — Other Ambulatory Visit: Payer: Self-pay | Admitting: Family Medicine

## 2017-04-03 DIAGNOSIS — E119 Type 2 diabetes mellitus without complications: Secondary | ICD-10-CM

## 2017-06-15 ENCOUNTER — Other Ambulatory Visit: Payer: Self-pay | Admitting: Family Medicine

## 2017-06-15 DIAGNOSIS — I10 Essential (primary) hypertension: Secondary | ICD-10-CM

## 2017-06-15 MED ORDER — VERAPAMIL HCL ER 240 MG PO CP24: ORAL_CAPSULE | ORAL | 4 refills | Status: DC

## 2017-06-15 MED ORDER — TRANDOLAPRIL 4 MG PO TABS: ORAL_TABLET | ORAL | 4 refills | Status: DC

## 2017-06-15 NOTE — Addendum Note (Signed)
Addended by: Malva LimesFISHER, DONALD E on: 06/15/2017 04:55 PM   Modules accepted: Orders

## 2017-06-15 NOTE — Telephone Encounter (Signed)
Pt contacted office for refill request on the following medications:  verapamil (VERELAN PM) 240 MG 24 hr capsule  trandolapril (MAVIK) 4 MG tablet  [facility].  CB#[phone]/MW

## 2017-06-15 NOTE — Telephone Encounter (Signed)
Please review. Thanks!  

## 2017-06-16 ENCOUNTER — Other Ambulatory Visit: Payer: Self-pay | Admitting: Family Medicine

## 2017-06-16 DIAGNOSIS — I10 Essential (primary) hypertension: Secondary | ICD-10-CM

## 2017-06-16 MED ORDER — TRANDOLAPRIL 4 MG PO TABS: ORAL_TABLET | ORAL | 4 refills | Status: DC

## 2017-06-16 MED ORDER — VERAPAMIL HCL ER 240 MG PO CP24: ORAL_CAPSULE | ORAL | 4 refills | Status: DC

## 2017-06-22 ENCOUNTER — Other Ambulatory Visit: Payer: Self-pay

## 2017-06-22 ENCOUNTER — Encounter: Payer: Self-pay | Admitting: Family Medicine

## 2017-06-22 ENCOUNTER — Ambulatory Visit: Payer: Managed Care, Other (non HMO) | Admitting: Family Medicine

## 2017-06-22 VITALS — BP 130/78 | HR 85 | Temp 98.0°F | Resp 16 | Ht 66.0 in | Wt 185.0 lb

## 2017-06-22 DIAGNOSIS — I10 Essential (primary) hypertension: Secondary | ICD-10-CM | POA: Diagnosis not present

## 2017-06-22 DIAGNOSIS — E2839 Other primary ovarian failure: Secondary | ICD-10-CM

## 2017-06-22 DIAGNOSIS — E119 Type 2 diabetes mellitus without complications: Secondary | ICD-10-CM

## 2017-06-22 LAB — POCT UA - MICROALBUMIN: MICROALBUMIN (UR) POC: NEGATIVE mg/L

## 2017-06-22 LAB — POCT GLYCOSYLATED HEMOGLOBIN (HGB A1C)
ESTIMATED AVERAGE GLUCOSE: 177
HEMOGLOBIN A1C: 7.8

## 2017-06-22 NOTE — Progress Notes (Signed)
Patient: Anna Sweeney Female    DOB: 12/05/1950   66 y.o.   MRN: [id number] Visit Date: 06/22/2017  Today's Provider: Mila Merryonald Fisher, MD   Chief Complaint  Patient presents with  . Follow-up  . Diabetes  . Hypertension   Subjective:    HPI   Diabetes Mellitus Type II, Follow-up:   Lab Results  Component Value Date   HGBA1C 6.6 03/21/2017   HGBA1C 8.2 11/16/2016   HGBA1C 7.8 07/09/2016   Last seen for diabetes 3 months ago.  Management since then includes; no changes. She reports good compliance with treatment. She is not having side effects. none Current symptoms include none and have been unchanged. Home blood sugar records: fasting range: not checking  Episodes of hypoglycemia? no   Current Insulin Regimen: n/a Most Recent Eye Exam: within last year.  Weight trend: stable   ------------------------------------------------------------------------   Hypertension, follow-up:  BP Readings from Last 3 Encounters:  06/22/17 130/78  03/21/17 128/82  12/28/16 (!) 140/7    She was last seen for hypertension 7 months ago.  BP at that visit was 112/66. Management since that visit includes; no changes.She reports good compliance with treatment. She is not having side effects. none She is not exercising. She is not adherent to low salt diet.   Outside blood pressures are not checking. She is experiencing none.  Patient denies none.   Cardiovascular risk factors include diabetes mellitus.  Use of agents associated with hypertension: none.   ------------------------------------------------------------------------    Lipid/Cholesterol, Follow-up:   Last seen for this 7 months ago.  Management since that visit includes; labs checked, no changes.  Last Lipid Panel:    Component Value Date/Time   CHOL 171 11/16/2016 1051   TRIG 219 (H) 11/16/2016 1051   HDL 37 (L) 11/16/2016 1051   CHOLHDL 4.6 (H) 11/16/2016 1051   LDLCALC 90 11/16/2016 1051     She reports good compliance with treatment. She is not having side effects. none  Wt Readings from Last 3 Encounters:  06/22/17 185 lb (83.9 kg)  03/21/17 175 lb (79.4 kg)  12/28/16 176 lb (79.8 kg)    ------------------------------------------------------------------------    Allergies  Allergen Reactions  . Pioglitazone     Dizziness     Current Outpatient Medications:  .  Alcohol Swabs (ALCOHOL PREP) 70 % PADS, , Disp: , Rfl:  .  aspirin EC 81 MG tablet, Take 81 mg by mouth daily., Disp: , Rfl:  .  ASSURE COMFORT LANCETS 30G MISC, , Disp: , Rfl:  .  betamethasone dipropionate (DIPROLENE) 0.05 % cream, Apply topically 2 (two) times daily as needed., Disp: 30 g, Rfl: 0 .  glipiZIDE (GLUCOTROL XL) 10 MG 24 hr tablet, Take 1 tablet (10 mg total) by mouth daily., Disp: 30 tablet, Rfl: 0 .  lovastatin (MEVACOR) 20 MG tablet, TAKE 1 TABLET BY MOUTH (20 MG TOTAL) AT BEDTIME, Disp: 30 tablet, Rfl: 0 .  metFORMIN (GLUCOPHAGE) 1000 MG tablet, TAKE 1 TABLET BY MOUTH TWICE A DAY, Disp: 60 tablet, Rfl: 6 .  ONE TOUCH ULTRA TEST test strip, , Disp: , Rfl:  .  trandolapril (MAVIK) 4 MG tablet, TAKE 1 TABLET BY MOUTH (4 MG TOTAL) DAILY, Disp: 90 tablet, Rfl: 4 .  verapamil (VERELAN PM) 240 MG 24 hr capsule, TAKE 1 CAPSULE BY MOUTH (240 MG TOTAL) DAILY, Disp: 90 capsule, Rfl: 4  Review of Systems  Constitutional: Negative for appetite change, chills, fatigue and fever.  Respiratory: Negative for chest tightness and shortness of breath.   Cardiovascular: Negative for chest pain and palpitations.  Gastrointestinal: Negative for abdominal pain, nausea and vomiting.  Neurological: Negative for dizziness and weakness.    Social History   Tobacco Use  . Smoking status: Former Smoker    Packs/day: 1.00    Years: 4.00    Pack years: 4.00  . Smokeless tobacco: Never Used  . Tobacco comment: quit in 1991  Substance Use Topics  . Alcohol use: No   Objective:   BP 130/78 (BP Location:  Right Arm, Patient Position: Sitting, Cuff Size: Normal)   Pulse 85   Temp 98 F (36.7 C) (Oral)   Resp 16   Ht 5\' 6"  (1.676 m)   Wt 185 lb (83.9 kg)   SpO2 98%   BMI 29.86 kg/m  Vitals:   06/22/17 1113  BP: 130/78  Pulse: 85  Resp: 16  Temp: 98 F (36.7 C)  TempSrc: Oral  SpO2: 98%  Weight: 185 lb (83.9 kg)  Height: 5\' 6"  (1.676 m)     Physical Exam   General Appearance:    Alert, cooperative, no distress, obese  Eyes:    PERRL, conjunctiva/corneas clear, EOM's intact       Lungs:     Clear to auscultation bilaterally, respirations unlabored  Heart:    Regular rate and rhythm  Neurologic:   Awake, alert, oriented x 3. No apparent focal neurological           defect.         Results for orders placed or performed in visit on 06/22/17  POCT glycosylated hemoglobin (Hb A1C)  Result Value Ref Range   Hemoglobin A1C 7.8    Est. average glucose Bld gHb Est-mCnc 177   POCT UA - Microalbumin  Result Value Ref Range   Microalbumin Ur, POC negative mg/L       Assessment & Plan:     1. Type 2 diabetes mellitus without complication, without long-term current use of insulin (HCC) A1c up significantly today. She is going to get more strict with diet, Continue current medications.   - POCT glycosylated hemoglobin (Hb A1C) - POCT UA - Microalbumin  2. . Estrogen deficiency  - DG Bone Density; Future  4. Essential hypertension Well controlled.  Continue current medications.    Return in about 4 months (around 10/20/2017).         Mila Merryonald Fisher, MD  [facility]

## 2017-08-11 ENCOUNTER — Ambulatory Visit
Admission: RE | Admit: 2017-08-11 | Discharge: 2017-08-11 | Disposition: A | Discharge status: Home / Self Care | Payer: Managed Care, Other (non HMO) | Source: Ambulatory Visit | Attending: Family Medicine | Admitting: Family Medicine

## 2017-08-11 DIAGNOSIS — M85852 Other specified disorders of bone density and structure, left thigh: Secondary | ICD-10-CM | POA: Insufficient documentation

## 2017-08-11 DIAGNOSIS — E2839 Other primary ovarian failure: Secondary | ICD-10-CM

## 2017-08-18 ENCOUNTER — Encounter: Payer: Self-pay | Admitting: Emergency Medicine

## 2017-08-18 ENCOUNTER — Other Ambulatory Visit: Payer: Self-pay | Admitting: *Deleted

## 2017-08-18 ENCOUNTER — Other Ambulatory Visit: Payer: Self-pay

## 2017-08-18 ENCOUNTER — Emergency Department
Admission: EM | Admit: 2017-08-18 | Discharge: 2017-08-18 | Disposition: A | Discharge status: Home / Self Care | Payer: Managed Care, Other (non HMO) | Attending: Emergency Medicine | Admitting: Emergency Medicine

## 2017-08-18 ENCOUNTER — Emergency Department: Payer: Managed Care, Other (non HMO)

## 2017-08-18 DIAGNOSIS — Z7984 Long term (current) use of oral hypoglycemic drugs: Secondary | ICD-10-CM | POA: Diagnosis not present

## 2017-08-18 DIAGNOSIS — N183 Chronic kidney disease, stage 3 (moderate): Secondary | ICD-10-CM | POA: Insufficient documentation

## 2017-08-18 DIAGNOSIS — E1122 Type 2 diabetes mellitus with diabetic chronic kidney disease: Secondary | ICD-10-CM | POA: Insufficient documentation

## 2017-08-18 DIAGNOSIS — I129 Hypertensive chronic kidney disease with stage 1 through stage 4 chronic kidney disease, or unspecified chronic kidney disease: Secondary | ICD-10-CM | POA: Insufficient documentation

## 2017-08-18 DIAGNOSIS — Z79899 Other long term (current) drug therapy: Secondary | ICD-10-CM | POA: Diagnosis not present

## 2017-08-18 DIAGNOSIS — Z7982 Long term (current) use of aspirin: Secondary | ICD-10-CM | POA: Insufficient documentation

## 2017-08-18 DIAGNOSIS — I471 Supraventricular tachycardia: Secondary | ICD-10-CM | POA: Diagnosis not present

## 2017-08-18 DIAGNOSIS — Z87891 Personal history of nicotine dependence: Secondary | ICD-10-CM | POA: Diagnosis not present

## 2017-08-18 DIAGNOSIS — R0789 Other chest pain: Secondary | ICD-10-CM | POA: Diagnosis present

## 2017-08-18 DIAGNOSIS — R079 Chest pain, unspecified: Secondary | ICD-10-CM | POA: Diagnosis not present

## 2017-08-18 LAB — COMPREHENSIVE METABOLIC PANEL
ALBUMIN: 4.2 g/dL (ref 3.5–5.0)
ALT: 34 U/L (ref 14–54)
AST: 46 U/L — AB (ref 15–41)
Alkaline Phosphatase: 81 U/L (ref 38–126)
Anion gap: 12 (ref 5–15)
BUN: 30 mg/dL — AB (ref 6–20)
CHLORIDE: 99 mmol/L — AB (ref 101–111)
CO2: 25 mmol/L (ref 22–32)
Calcium: 9.8 mg/dL (ref 8.9–10.3)
Creatinine, Ser: 1.5 mg/dL — ABNORMAL HIGH (ref 0.44–1.00)
GFR calc Af Amer: 41 mL/min — ABNORMAL LOW (ref >60)
GFR, EST NON AFRICAN AMERICAN: 35 mL/min — AB (ref >60)
GLUCOSE: 276 mg/dL — AB (ref 65–99)
Potassium: 4.7 mmol/L (ref 3.5–5.1)
Sodium: 136 mmol/L (ref 135–145)
Total Bilirubin: 0.8 mg/dL (ref 0.3–1.2)
Total Protein: 8.1 g/dL (ref 6.5–8.1)

## 2017-08-18 LAB — CBC WITH DIFFERENTIAL/PLATELET
BASOS ABS: 0.1 10*3/uL (ref 0–0.1)
BASOS PCT: 1 %
Eosinophils Absolute: 0.3 10*3/uL (ref 0–0.7)
Eosinophils Relative: 4 %
HCT: 44.3 % (ref 35.0–47.0)
Hemoglobin: 14.6 g/dL (ref 12.0–16.0)
LYMPHS PCT: 26 %
Lymphs Abs: 1.8 10*3/uL (ref 1.0–3.6)
MCH: 29.9 pg (ref 26.0–34.0)
MCHC: 33 g/dL (ref 32.0–36.0)
MCV: 90.4 fL (ref 80.0–100.0)
Monocytes Absolute: 0.5 10*3/uL (ref 0.2–0.9)
Monocytes Relative: 8 %
NEUTROS ABS: 4.2 10*3/uL (ref 1.4–6.5)
Neutrophils Relative %: 61 %
PLATELETS: 189 10*3/uL (ref 150–440)
RBC: 4.9 MIL/uL (ref 3.80–5.20)
RDW: 13.8 % (ref 11.5–14.5)
WBC: 6.8 10*3/uL (ref 3.6–11.0)

## 2017-08-18 LAB — TROPONIN I

## 2017-08-18 LAB — TSH: TSH: 1.097 u[IU]/mL (ref 0.350–4.500)

## 2017-08-18 IMAGING — CR DG CHEST 2V
1 series · 2 of 2 positions shown · non-contrast
Comparison: [DATE]

CLINICAL DATA: Left shoulder and chest pain.  Palpitations.

EXAM:
CHEST  2 VIEW

[Series 1: dg chest 2 view · 0.14mm/px · 2 of 2 slices shown]
[im 1/2]
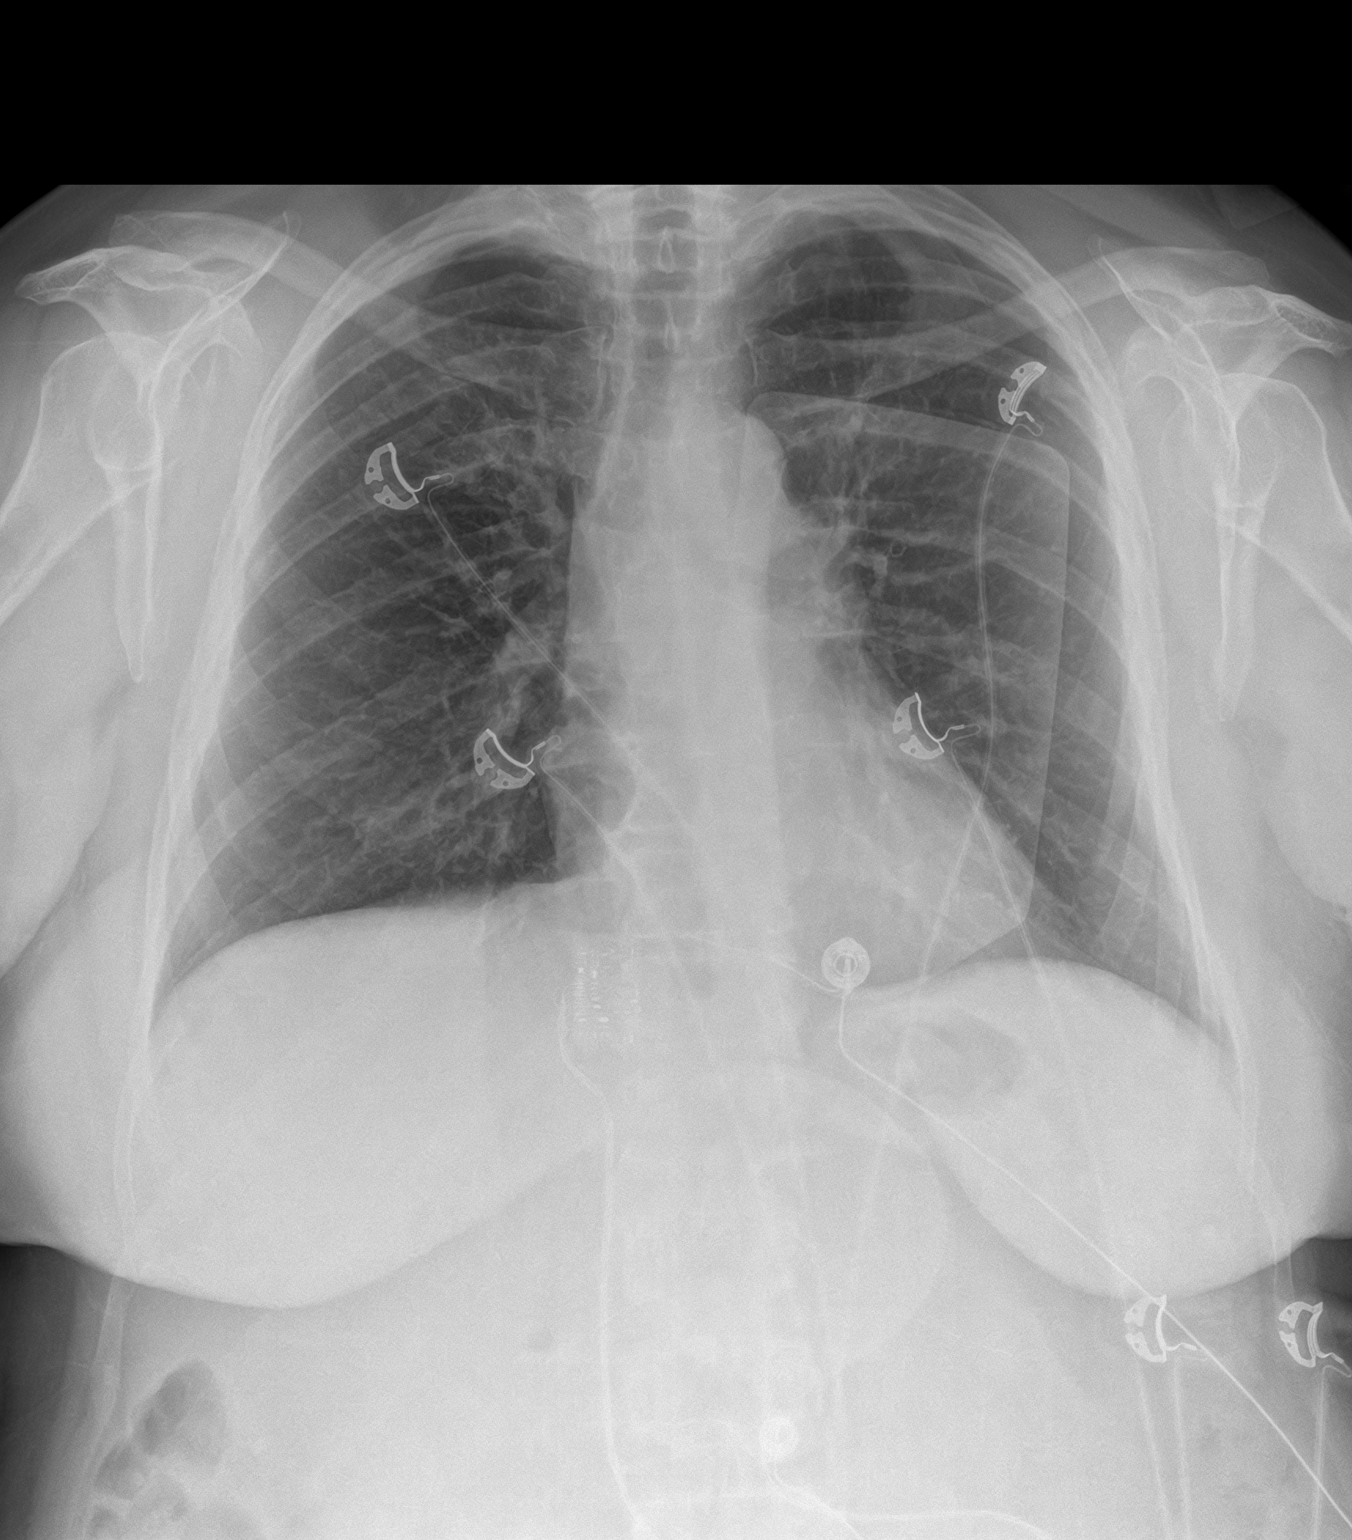
[im 2/2]
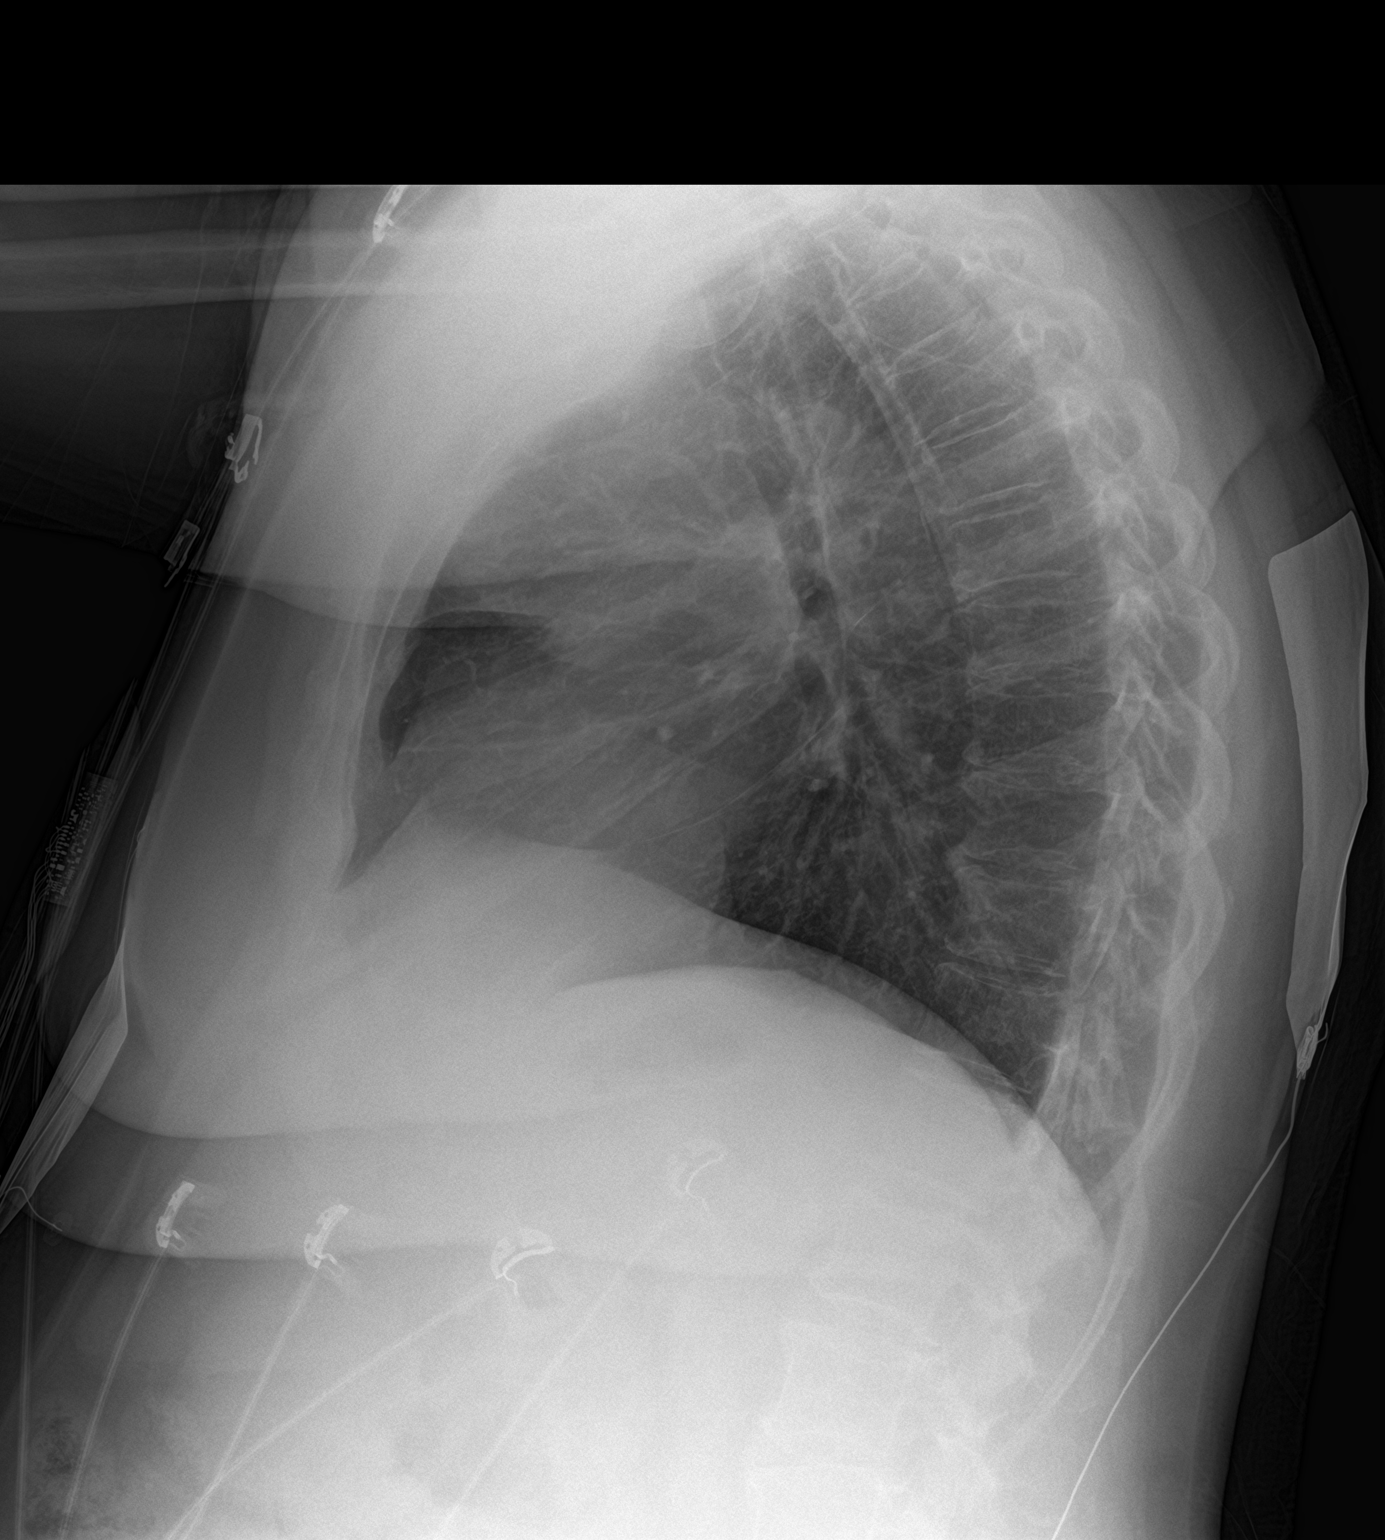

[2 of 2 positions shown; findings below may reference images not displayed]

FINDINGS: External pacing pads present. The heart size and mediastinal
contours are within normal limits. There is no evidence of pulmonary
edema, consolidation, pneumothorax, nodule or pleural fluid. The
visualized skeletal structures are unremarkable.
IMPRESSION: No active cardiopulmonary disease.

## 2017-08-18 MED ORDER — ADENOSINE 12 MG/4ML IV SOLN: 12.0000 mg | Freq: Once | INTRAVENOUS | Status: DC

## 2017-08-18 MED ORDER — ADENOSINE 6 MG/2ML IV SOLN: 6.0000 mg | Freq: Once | INTRAVENOUS | Status: DC

## 2017-08-18 MED ORDER — METOPROLOL TARTRATE 25 MG PO TABS: 25.0000 mg | ORAL_TABLET | Freq: Two times a day (BID) | ORAL | 11 refills | Status: DC

## 2017-08-18 MED ORDER — ADENOSINE 6 MG/2ML IV SOLN
INTRAVENOUS | Status: AC
  Filled 2017-08-18: qty 2

## 2017-08-18 MED ORDER — ADENOSINE 12 MG/4ML IV SOLN
INTRAVENOUS | Status: AC
  Filled 2017-08-18: qty 4

## 2017-08-18 NOTE — ED Provider Notes (Signed)
 [facility] Emergency Department Provider Note   ____________________________________________   First MD Initiated Contact with Patient 08/18/17 1309     (approximate)  I have reviewed the triage vital signs and the nursing notes.   HISTORY  Chief Complaint Tachycardia    HPI Adamae Taketa is a 67 y.o. female Who reports she was sitting in her recliner and developed chest pain in heart palpitations. Came on about an hour prior to arrival when she got here she was in S VT at a rate of 198. She did not convert with Valsalva maneuver or blowing through a straw. Operations were being made to give her adenosine when she converted back to normal sinus rhythm. Chest pain which had come on at the same time as the SVT resolved. She was never short of breath or nauseated. Chest pain which is mild left chest.   Past Medical History:  Diagnosis Date  . BRCA negative   . Diabetes mellitus without complication (HCC)   . History of chicken pox   . Hypertension     Patient Active Problem List   Diagnosis Date Noted  . Elevated transaminase level 11/17/2016  . Nodule of left lung 05/13/2015  . 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 Eczema 04/30/2015  . High cholesterol   . Family history of malignant neoplasm of breast   . Hypertension   . Chronic kidney disease (CKD), stage III (moderate) (HCC) 05/18/2011  . Seborrhea 07/21/2009  . Hypocalcemia 01/31/2009  . Diabetes mellitus, type 2 (HCC) 01/15/2009    Past Surgical History:  Procedure Laterality Date  . FOOT SURGERY      Prior to Admission medications   Medication Sig Start Date End Date Taking? Authorizing Provider  aspirin EC 81 MG tablet Take 81 mg by mouth daily.    [provider]  betamethasone dipropionate (DIPROLENE) 0.05 % cream Apply topically 2 (two) times daily as needed. 03/10/16   Fisher, Donald E, MD  glipiZIDE (GLUCOTROL XL) 10 MG 24 hr tablet Take 1 tablet (10 mg total) by mouth  daily. 02/28/17   Fisher, Donald E, MD  lovastatin (MEVACOR) 20 MG tablet TAKE 1 TABLET BY MOUTH (20 MG TOTAL) AT BEDTIME 02/28/17   Fisher, Donald E, MD  metFORMIN (GLUCOPHAGE) 1000 MG tablet TAKE 1 TABLET BY MOUTH TWICE A DAY 04/03/17   Fisher, Donald E, MD  metoprolol tartrate (LOPRESSOR) 25 MG tablet Take 1 tablet (25 mg total) by mouth 2 (two) times daily. 08/18/17 08/18/18  Malinda, Paul F, MD  trandolapril (MAVIK) 4 MG tablet TAKE 1 TABLET BY MOUTH (4 MG TOTAL) DAILY 06/16/17   Fisher, Donald E, MD  verapamil (VERELAN PM) 240 MG 24 hr capsule TAKE 1 CAPSULE BY MOUTH (240 MG TOTAL) DAILY 06/16/17   Fisher, Donald E, MD    Allergies Pioglitazone  Family History  Problem Relation Age of Onset  . Breast cancer Mother   . Breast cancer Sister   . Cancer Sister        renal     Social History Social History   Tobacco Use  . Smoking status: Former Smoker    Packs/day: 1.00    Years: 4.00    Pack years: 4.00  . Smokeless tobacco: Never Used  . Tobacco comment: quit in 1991  Substance Use Topics  . Alcohol use: No  . Drug use: No    Review of Systems  Constitutional: No fever/chills Eyes: No visual changes. ENT: No sore throat. Cardiovascular:chest pain. Respiratory:   Denies shortness of breath. Gastrointestinal: No abdominal pain.  No nausea, no vomiting.  No diarrhea.  No constipation. Genitourinary: Negative for dysuria. Musculoskeletal: Negative for back pain. Skin: Negative for rash. Neurological: Negative for headaches, focal weakness  ____________________________________________   PHYSICAL EXAM:  VITAL SIGNS: ED Triage Vitals  Enc Vitals Group     BP 08/18/17 1242 (!) 136/97     Pulse Rate 08/18/17 1242 (!) 196     Resp 08/18/17 1242 18     Temp 08/18/17 1242 (!) 97.5 F (36.4 C)     Temp Source 08/18/17 1242 Oral     SpO2 08/18/17 1242 98 %     Weight 08/18/17 1243 180 lb (81.6 kg)     Height 08/18/17 1243 5' 6" (1.676 m)     Head Circumference --       Peak Flow --      Pain Score 08/18/17 1242 7     Pain Loc --      Pain Edu? --      Excl. in [place]? --     Constitutional: Alert and oriented. Well appearing and in no acute distress. Eyes: Conjunctivae are normal.  Head: Atraumatic. Nose: No congestion/rhinnorhea. Mouth/Throat: Mucous membranes are moist.  Oropharynx non-erythematous. Neck: No stridor.   Cardiovascular: rapid rate, regular rhythm. Grossly normal heart sounds.  Good peripheral circulation. Respiratory: Normal respiratory effort.  No retractions. Lungs CTAB. Gastrointestinal: Soft and nontender. No distention. No abdominal bruits. No CVA tenderness. Musculoskeletal: No lower extremity tenderness nor edema.  No joint effusions. Neurologic:  Normal speech and language. No gross focal neurologic deficits are appreciated. No gait instability. Skin:  Skin is warm, dry and intact. No rash noted. Psychiatric: Mood and affect are normal. Speech and behavior are normal.  ____________________________________________   LABS (all labs ordered are listed, but only abnormal results are displayed)  Labs Reviewed  COMPREHENSIVE METABOLIC PANEL - Abnormal; Notable for the following components:      Result Value   Chloride 99 (*)    Glucose, Bld 276 (*)    BUN 30 (*)    Creatinine, Ser 1.50 (*)    AST 46 (*)    GFR calc non Af Amer 35 (*)    GFR calc Af Amer 41 (*)    All other components within normal limits  TSH  CBC WITH DIFFERENTIAL/PLATELET  TROPONIN I   ____________________________________________  EKG  EKG #1 read and interpreted by me shows SVT at a rate of 186 normal axis no acute ST-T wave changes EKG #2 was done at the time of conversion to sinus rhythm shows sinus tach computer measured average rate of 147 after the initial rates like 150 and then it goes down to rate of 100 again no acute ST-T changes EKG #3 cirrhosis sinus tachycardia rate of 104 normal axis everything is normal except for the  rate ____________________________________________  RADIOLOGY  Dg Chest 2 View  Result Date: 08/18/2017 CLINICAL DATA:  Left shoulder and chest pain.  Palpitations. EXAM: CHEST  2 VIEW COMPARISON:  04/30/2015 FINDINGS: External pacing pads present. The heart size and mediastinal contours are within normal limits. There is no evidence of pulmonary edema, consolidation, pneumothorax, nodule or pleural fluid. The visualized skeletal structures are unremarkable. IMPRESSION: No active cardiopulmonary disease. Electronically Signed   By: Aletta Edouard M.D.   On: 08/18/2017 13:30   chest x-ray is essentially normal ____________________________________________   PROCEDURES  Procedure(s) performed:  Procedures  Critical Care performed:  ____________________________________________   INITIAL IMPRESSION / ASSESSMENT AND PLAN / ED COURSE  patient has been stable since she spontaneously converted. Discussed her with Dr. Reed of the cardiologist. Her TSH is normal she is not a drinker she doesn't use caffeine he will follow her up in the office will start her metoprolol 25 twice a day. She is not taking the verapamil she has a prescription for she cannot find anybody to supplied for her.     ____________________________________________   FINAL CLINICAL IMPRESSION(S) / ED DIAGNOSES  Final diagnoses:  SVT (supraventricular tachycardia) (HCC)     ED Discharge Orders        Ordered    metoprolol tartrate (LOPRESSOR) 25 MG tablet  2 times daily     08/18/17 1450       Note:  This document was prepared using Dragon voice recognition software and may include unintentional dictation errors.    Malinda, Paul F, MD 08/18/17 1500  

## 2017-08-18 NOTE — ED Notes (Signed)
Dr. Darnelle CatalanMalinda at bedside at this time. Pt noted to be in SVT with HR 189. Zoll defib at bedside as well.

## 2017-08-18 NOTE — ED Notes (Signed)
Patient transported to X-ray 

## 2017-08-18 NOTE — Discharge Instructions (Signed)
I spoke with Dr. Kirke CorinARIDA the cardiologist.he will have Dr. Graciela HusbandsKlein follow you up in the office. They will call you with the appointment. In the meantime they want to take metoprolol 25 mg twice a day.

## 2017-08-18 NOTE — ED Notes (Signed)
Pt converted over to Sinus Tach and then to Sinus Rhythm directly prior to administration of Adenosine.  EKG captured.  EDP at bedside at time of conversion.

## 2017-08-18 NOTE — ED Triage Notes (Signed)
Pt presents with left side chest pain that began approximately one hour ago. Triage EKG demonstrates SVT.

## 2017-08-18 NOTE — ED Notes (Signed)
Pt converted to ST without use of medications. EKG Obtained. Morrie SheldonAshley, RN at bedside at this time.

## 2017-08-18 NOTE — ED Notes (Signed)
Collected blood specimens red top, blue top, lt green top, lav top. Sent to lab pending orders.

## 2017-08-26 ENCOUNTER — Other Ambulatory Visit: Payer: Self-pay

## 2017-08-26 ENCOUNTER — Ambulatory Visit (INDEPENDENT_AMBULATORY_CARE_PROVIDER_SITE_OTHER): Payer: Managed Care, Other (non HMO)

## 2017-08-26 DIAGNOSIS — I471 Supraventricular tachycardia: Secondary | ICD-10-CM

## 2017-08-30 ENCOUNTER — Other Ambulatory Visit: Payer: Self-pay | Admitting: Cardiovascular Disease

## 2017-08-30 DIAGNOSIS — Z8249 Family history of ischemic heart disease and other diseases of the circulatory system: Secondary | ICD-10-CM

## 2017-08-31 ENCOUNTER — Ambulatory Visit: Payer: Managed Care, Other (non HMO)

## 2017-08-31 DIAGNOSIS — Z8249 Family history of ischemic heart disease and other diseases of the circulatory system: Secondary | ICD-10-CM

## 2017-09-15 ENCOUNTER — Encounter: Payer: Self-pay | Admitting: Internal Medicine

## 2017-09-15 ENCOUNTER — Ambulatory Visit (INDEPENDENT_AMBULATORY_CARE_PROVIDER_SITE_OTHER): Payer: Managed Care, Other (non HMO) | Admitting: Internal Medicine

## 2017-09-15 VITALS — BP 116/64 | HR 126 | Ht 66.0 in | Wt 182.5 lb

## 2017-09-15 DIAGNOSIS — I10 Essential (primary) hypertension: Secondary | ICD-10-CM

## 2017-09-15 DIAGNOSIS — I471 Supraventricular tachycardia: Secondary | ICD-10-CM

## 2017-09-15 MED ORDER — VERAPAMIL HCL ER 240 MG PO CP24: 240.0000 mg | ORAL_CAPSULE | Freq: Every day | ORAL | 3 refills | Status: DC

## 2017-09-15 NOTE — Progress Notes (Signed)
ELECTROPHYSIOLOGY CONSULT NOTE  Patient ID: Anna Sweeney, MRN: [id number], DOB/AGE: Sep 23, 1950 67 y.o. Admit date: (Not on file) Date of Consult: 09/15/2017  Primary Physician: [name], MD Primary Cardiologist: new     Anna Sweeney is a 67 y.o. female who is being seen today for the evaluation of SVT  at the request of Dr Caryn Section.    HPI Anna Sweeney is a 67 y.o. female  Who has had an isolated episode of SVT documented in 1/19.  She has a history of hypertension for which she has been treated with verapamil.  She had run out of her medications had this episode.  The abrupt in onset as well as offset.   There was no associated chest discomfort shortness of breath or lightheadedness.   She felt the palpitations in her left upper chest.  There was no diuresis  She is not fit but denies dyspnea on her daily activities; she has no chest discomfort.  She has no peripheral edema.  Has diabetes and hypertension but does not smoke; is on statin therapy.    Past Medical History:  Diagnosis Date  . BRCA negative   . Diabetes mellitus without complication ([place])   . History of chicken pox   . Hypertension       Surgical History:  Past Surgical History:  Procedure Laterality Date  . FOOT SURGERY       Home Meds: Prior to Admission medications   Medication Sig Start Date End Date Taking? Authorizing Provider  aspirin EC 81 MG tablet Take 81 mg by mouth daily.   Yes [provider]  betamethasone dipropionate (DIPROLENE) 0.05 % cream Apply topically 2 (two) times daily as needed. 03/10/16  Yes [name], MD  glipiZIDE (GLUCOTROL XL) 10 MG 24 hr tablet Take 1 tablet (10 mg total) by mouth daily. 02/28/17  Yes [name], MD  lovastatin (MEVACOR) 20 MG tablet TAKE 1 TABLET BY MOUTH (20 MG TOTAL) AT BEDTIME 02/28/17  Yes [name], MD  metFORMIN (GLUCOPHAGE) 1000 MG tablet TAKE 1 TABLET BY MOUTH TWICE A DAY 04/03/17  Yes [name], MD    metoprolol tartrate (LOPRESSOR) 25 MG tablet Take 1 tablet (25 mg total) by mouth 2 (two) times daily. 08/18/17 08/18/18 Yes Nena Polio, MD  trandolapril (MAVIK) 4 MG tablet TAKE 1 TABLET BY MOUTH (4 MG TOTAL) DAILY 06/16/17  Yes [name], MD    Allergies:  Allergies  Allergen Reactions  . Pioglitazone     Dizziness    Social History   Socioeconomic History  . Marital status: Widowed    Spouse name: Not on file  . Number of children: Not on file  . Years of education: Not on file  . Highest education level: Not on file  Social Needs  . Financial resource strain: Not on file  . Food insecurity - worry: Not on file  . Food insecurity - inability: Not on file  . Transportation needs - medical: Not on file  . Transportation needs - non-medical: Not on file  Occupational History  . Not on file  Tobacco Use  . Smoking status: Former Smoker    Packs/day: 1.00    Years: 4.00    Pack years: 4.00  . Smokeless tobacco: Never Used  . Tobacco comment: quit in 1991  Substance and Sexual Activity  . Alcohol use: No  . Drug use: No  . Sexual activity: Not on file  Other Topics Concern  . Not on file  Social History Narrative  . Not on file     Family History  Problem Relation Age of Onset  . Breast cancer Mother   . Breast cancer Sister   . Cancer Sister        renal      ROS:  Please see the history of present illness.     All other systems reviewed and negative.    Physical Exam:  Blood pressure 116/64, pulse (!) 126, height 5' 6"  (1.676 m), weight 182 lb 8 oz (82.8 kg). General: Well developed, well nourished female in no acute distress. Head: Normocephalic, atraumatic, sclera non-icteric, no xanthomas, nares are without discharge. EENT: normal  Lymph Nodes:  none Neck: Negative for carotid bruits. JVD not elevated. Back:without scoliosis kyphosis Lungs: Clear bilaterally to auscultation without wheezes, rales, or rhonchi. Breathing is unlabored. Heart:  RRR with S1 S2. No murmur . No rubs, or gallops appreciated. Abdomen: Soft, non-tender, non-distended with normoactive bowel sounds. No hepatomegaly. No rebound/guarding. No obvious abdominal masses. Msk:  Strength and tone appear normal for age. Extremities: No clubbing or cyanosis. No  edema.  Distal pedal pulses are 2+ and equal bilaterally. Skin: Warm and Dry Neuro: Alert and oriented X 3. CN III-XII intact Grossly normal sensory and motor function . Psych:  Responds to questions appropriately with a normal affect.      Labs: Cardiac Enzymes No results for input(s): CKTOTAL, CKMB, TROPONINI in the last 72 hours. CBC Lab Results  Component Value Date   WBC 6.8 08/18/2017   HGB 14.6 08/18/2017   HCT 44.3 08/18/2017   MCV 90.4 08/18/2017   PLT 189 08/18/2017   PROTIME: No results for input(s): LABPROT, INR in the last 72 hours. Chemistry No results for input(s): NA, K, CL, CO2, BUN, CREATININE, CALCIUM, PROT, BILITOT, ALKPHOS, ALT, AST, GLUCOSE in the last 168 hours.  Invalid input(s): LABALBU Lipids Lab Results  Component Value Date   CHOL 171 11/16/2016   HDL 37 (L) 11/16/2016   LDLCALC 90 11/16/2016   TRIG 219 (H) 11/16/2016   BNP No results found for: PROBNP Thyroid Function Tests: No results for input(s): TSH, T4TOTAL, T3FREE, THYROIDAB in the last 72 hours.  Invalid input(s): FREET3 Miscellaneous No results found for: DDIMER  Radiology/Studies:  Dg Chest 2 View  Result Date: 08/18/2017 CLINICAL DATA:  Left shoulder and chest pain.  Palpitations. EXAM: CHEST  2 VIEW COMPARISON:  04/30/2015 FINDINGS: External pacing pads present. The heart size and mediastinal contours are within normal limits. There is no evidence of pulmonary edema, consolidation, pneumothorax, nodule or pleural fluid. The visualized skeletal structures are unremarkable. IMPRESSION: No active cardiopulmonary disease. Electronically Signed   By: Aletta Edouard M.D.   On: 08/18/2017 13:30   Vas  US Vascuscreen     EKG: Sinus at 80 14/08/40 No evidence of preexcitation 08/18/17 ECG 1-SVT at 300 ms no discernible retrograde P waves 08/18/17 ECG 2 SVT converted to sinus rhythm  Assessment and Plan:   SVT--   Diabetes  Hypertension    Patient has had an isolated episode of SVT.  We have discussed treatment strategies including nothing, medical therapy catheter ablation; she has been on verapamil for a long time.  Hence, we thought reasonable to resume her verapamil, discontinuing her beta-blocker.  Given the paucity of associated symptoms, I have suggested that she could stay home with recurrences for up to 6 hours.  We will gather again as needed  for recurrences.  She will remain on [facility] for renal protection with her diabetes  She is on statin therapy; will defer to Dr. Caryn Section th use of ongoing aspirin     Virl Axe

## 2017-09-15 NOTE — Patient Instructions (Addendum)
Medication Instructions:  Your physician has recommended you make the following change in your medication:    1. Stop Metoprolol    Labwork: None ordered.  Testing/Procedures: None ordered.  Follow-Up: Your physician recommends that you schedule a follow-up appointment with Dr Graciela HusbandsKlein as needed.  Any Other Special Instructions Will Be Listed Below (If Applicable).     If you need a refill on your cardiac medications before your next appointment, please call your pharmacy.

## 2017-09-21 ENCOUNTER — Ambulatory Visit: Payer: Managed Care, Other (non HMO) | Admitting: Internal Medicine

## 2017-09-22 ENCOUNTER — Encounter: Payer: Self-pay | Admitting: *Deleted

## 2017-10-24 ENCOUNTER — Encounter: Payer: Self-pay | Admitting: Family Medicine

## 2017-10-24 ENCOUNTER — Ambulatory Visit: Payer: Managed Care, Other (non HMO) | Admitting: Family Medicine

## 2017-10-24 VITALS — BP 104/60 | HR 78 | Temp 97.9°F | Resp 16

## 2017-10-24 DIAGNOSIS — E119 Type 2 diabetes mellitus without complications: Secondary | ICD-10-CM

## 2017-10-24 DIAGNOSIS — I10 Essential (primary) hypertension: Secondary | ICD-10-CM | POA: Diagnosis not present

## 2017-10-24 LAB — POCT GLYCOSYLATED HEMOGLOBIN (HGB A1C)
ESTIMATED AVERAGE GLUCOSE: 197
HEMOGLOBIN A1C: 8.5

## 2017-10-24 MED ORDER — DULAGLUTIDE 0.75 MG/0.5ML ~~LOC~~ SOAJ: 0.7500 mg | SUBCUTANEOUS | 1 refills | Status: DC

## 2017-10-24 NOTE — Addendum Note (Signed)
Addended by: Marlene LardMILLER, Rorey Hodges M on: 10/24/2017 01:55 PM   Modules accepted: Orders

## 2017-10-24 NOTE — Progress Notes (Addendum)
Patient: Anna Sweeney Female    DOB: Oct 15, 1950   67 y.o.   MRN: [id number] Visit Date: 10/24/2017  Today's Provider: Lelon Huh, MD   Chief Complaint  Patient presents with  . Follow-up  . Diabetes  . Hypertension   Subjective:    HPI   Diabetes Mellitus Type II, Follow-up:   Lab Results  Component Value Date   HGBA1C 7.8 06/22/2017   HGBA1C 6.6 03/21/2017   HGBA1C 8.2 11/16/2016   Last seen for diabetes 4 months ago.  Management since then includes; advised to get more strict with her diet. She reports good compliance with treatment. She is not having side effects. none Current symptoms include none and have been unchanged. Home blood sugar records: fasting range: not checking  Episodes of hypoglycemia? no   Current Insulin Regimen: n/a Most Recent Eye Exam: due Weight trend: stable Prior visit with dietician: no Current diet: in general, an "unhealthy" diet Current exercise: none  ----------------------------------------------------------------   Hypertension, follow-up:  BP Readings from Last 3 Encounters:  10/24/17 104/60  09/15/17 116/64  08/18/17 119/71    She was last seen for hypertension 4 months ago.  BP at that visit was 130/78. Management since that visit includes; no changes.She reports good compliance with treatment. She is not having side effects. none She is not exercising. She is adherent to low salt diet.   Outside blood pressures are not checking. She is experiencing none.  Patient denies none.   Cardiovascular risk factors include diabetes mellitus.  Use of agents associated with hypertension: none.   ----------------------------------------------------------------    Allergies  Allergen Reactions  . Pioglitazone     Dizziness     Current Outpatient Medications:  .  aspirin EC 81 MG tablet, Take 81 mg by mouth daily., Disp: , Rfl:  .  betamethasone dipropionate (DIPROLENE) 0.05 % cream, Apply topically 2 (two)  times daily as needed., Disp: 30 g, Rfl: 0 .  glipiZIDE (GLUCOTROL XL) 10 MG 24 hr tablet, Take 1 tablet (10 mg total) by mouth daily., Disp: 30 tablet, Rfl: 0 .  lovastatin (MEVACOR) 20 MG tablet, TAKE 1 TABLET BY MOUTH (20 MG TOTAL) AT BEDTIME, Disp: 30 tablet, Rfl: 0 .  metFORMIN (GLUCOPHAGE) 1000 MG tablet, TAKE 1 TABLET BY MOUTH TWICE A DAY, Disp: 60 tablet, Rfl: 6 .  trandolapril (MAVIK) 4 MG tablet, TAKE 1 TABLET BY MOUTH (4 MG TOTAL) DAILY, Disp: 90 tablet, Rfl: 4 .  verapamil (VERELAN PM) 240 MG 24 hr capsule, Take 1 capsule (240 mg total) by mouth at bedtime., Disp: 90 capsule, Rfl: 3  Review of Systems  Constitutional: Negative for appetite change, chills, fatigue and fever.  Respiratory: Negative for chest tightness and shortness of breath.   Cardiovascular: Negative for chest pain and palpitations.  Gastrointestinal: Negative for abdominal pain, nausea and vomiting.  Neurological: Negative for dizziness and weakness.    Social History   Tobacco Use  . Smoking status: Former Smoker    Packs/day: 1.00    Years: 4.00    Pack years: 4.00  . Smokeless tobacco: Never Used  . Tobacco comment: quit in 1991  Substance Use Topics  . Alcohol use: No   Objective:   BP 104/60 (BP Location: Right Arm, Patient Position: Sitting, Cuff Size: Large)   Pulse 78   Temp 97.9 F (36.6 C) (Oral)   Resp 16   SpO2 98%  Vitals:   10/24/17 0959  BP: 104/60  Pulse:  78  Resp: 16  Temp: 97.9 F (36.6 C)  TempSrc: Oral  SpO2: 98%     Physical Exam   General Appearance:    Alert, cooperative, no distress  Eyes:    PERRL, conjunctiva/corneas clear, EOM's intact       Lungs:     Clear to auscultation bilaterally, respirations unlabored  Heart:    Regular rate and rhythm  Neurologic:   Awake, alert, oriented x 3. No apparent focal neurological           defect.       Results for orders placed or performed in visit on 10/24/17  POCT glycosylated hemoglobin (Hb A1C)  Result Value  Ref Range   Hemoglobin A1C 8.5    Est. average glucose Bld gHb Est-mCnc 197        Assessment & Plan:     1. Type 2 diabetes mellitus without complication, without long-term current use of insulin (HCC) Worsening Discussed various classes of medications for better control of diabetes. She does not want SGLT-2 agonist due to association with UTI and yeast infections, and her eGFR is marginal. Will try.   - Dulaglutide (TRULICITY) 2.42 PN/3.6RW SOPN; Take 0.75 mg by mouth once a week.  Dispense: 4 pen; Refill: 1  2. Essential hypertension Well controlled.  Continue current medications.    Return in about 1 month (around 11/24/2017).        Lelon Huh, MD  [facility]

## 2017-11-24 ENCOUNTER — Ambulatory Visit: Payer: Self-pay | Admitting: Family Medicine

## 2017-11-28 ENCOUNTER — Ambulatory Visit (INDEPENDENT_AMBULATORY_CARE_PROVIDER_SITE_OTHER): Payer: Managed Care, Other (non HMO) | Admitting: Family Medicine

## 2017-11-28 ENCOUNTER — Encounter: Payer: Self-pay | Admitting: Family Medicine

## 2017-11-28 VITALS — BP 120/68 | HR 83 | Temp 98.1°F | Resp 16 | Ht 66.0 in | Wt 177.0 lb

## 2017-11-28 DIAGNOSIS — E119 Type 2 diabetes mellitus without complications: Secondary | ICD-10-CM

## 2017-11-28 LAB — POCT GLYCOSYLATED HEMOGLOBIN (HGB A1C)
Est. average glucose Bld gHb Est-mCnc: 186
Hemoglobin A1C: 8.1

## 2017-11-28 MED ORDER — DULAGLUTIDE 0.75 MG/0.5ML ~~LOC~~ SOAJ: 0.7500 mg | SUBCUTANEOUS | 3 refills | Status: DC

## 2017-11-28 MED ORDER — GLIPIZIDE ER 5 MG PO TB24: 5.0000 mg | ORAL_TABLET | Freq: Every day | ORAL | 1 refills | Status: DC

## 2017-11-28 NOTE — Progress Notes (Signed)
Patient: Anna Sweeney Female    DOB: September 19, 1950   67 y.o.   MRN: [id number] Visit Date: 11/28/2017  Today's Provider: Mila Merry, MD   Chief Complaint  Patient presents with  . Follow-up  . Diabetes   Subjective:    HPI   Diabetes Mellitus Type II, Follow-up:   Lab Results  Component Value Date   HGBA1C 8.5 10/24/2017   HGBA1C 7.8 06/22/2017   HGBA1C 6.6 03/21/2017   Last seen for diabetes 1 months ago.  Management since then includes; worsening, started a trial of Trulicity. She reports good compliance with treatment. She is not having side effects. none Current symptoms include none and have been unchanged. Home blood sugar records: fasting range: not checking  Episodes of hypoglycemia? no   Current Insulin Regimen: n/a Most Recent Eye Exam: due Weight trend: stable Prior visit with dietician: no Current diet: in general, an "unhealthy" diet Current exercise: none  ----------------------------------------------------------------    Allergies  Allergen Reactions  . Pioglitazone     Dizziness     Current Outpatient Medications:  .  aspirin EC 81 MG tablet, Take 81 mg by mouth daily., Disp: , Rfl:  .  betamethasone dipropionate (DIPROLENE) 0.05 % cream, Apply topically 2 (two) times daily as needed., Disp: 30 g, Rfl: 0 .  Dulaglutide (TRULICITY) 0.75 MG/0.5ML SOPN, Take 0.75 mg by mouth once a week., Disp: 4 pen, Rfl: 1 .  glipiZIDE (GLUCOTROL XL) 10 MG 24 hr tablet, Take 1 tablet (10 mg total) by mouth daily., Disp: 30 tablet, Rfl: 0 .  lovastatin (MEVACOR) 20 MG tablet, TAKE 1 TABLET BY MOUTH (20 MG TOTAL) AT BEDTIME, Disp: 30 tablet, Rfl: 0 .  metFORMIN (GLUCOPHAGE) 1000 MG tablet, TAKE 1 TABLET BY MOUTH TWICE A DAY, Disp: 60 tablet, Rfl: 6 .  trandolapril (MAVIK) 4 MG tablet, TAKE 1 TABLET BY MOUTH (4 MG TOTAL) DAILY, Disp: 90 tablet, Rfl: 4 .  verapamil (VERELAN PM) 240 MG 24 hr capsule, Take 1 capsule (240 mg total) by mouth at bedtime., Disp:  90 capsule, Rfl: 3  Review of Systems  Constitutional: Negative for appetite change, chills, fatigue and fever.  Respiratory: Negative for chest tightness and shortness of breath.   Cardiovascular: Negative for chest pain and palpitations.  Gastrointestinal: Negative for abdominal pain, nausea and vomiting.  Neurological: Negative for dizziness and weakness.    Social History   Tobacco Use  . Smoking status: Former Smoker    Packs/day: 1.00    Years: 4.00    Pack years: 4.00  . Smokeless tobacco: Never Used  . Tobacco comment: quit in 1991  Substance Use Topics  . Alcohol use: No   Objective:   BP 120/68 (BP Location: Right Arm, Patient Position: Sitting, Cuff Size: Large)   Pulse 83   Temp 98.1 F (36.7 C) (Oral)   Resp 16   Ht 5\' 6"  (1.676 m)   Wt 177 lb (80.3 kg)   SpO2 99%   BMI 28.57 kg/m  Vitals:   11/28/17 1005  BP: 120/68  Pulse: 83  Resp: 16  Temp: 98.1 F (36.7 C)  TempSrc: Oral  SpO2: 99%  Weight: 177 lb (80.3 kg)  Height: 5\' 6"  (1.676 m)     Physical Exam   General Appearance:    Alert, cooperative, no distress  Eyes:    PERRL, conjunctiva/corneas clear, EOM's intact       Lungs:     Clear to auscultation bilaterally,  respirations unlabored  Heart:    Regular rate and rhythm  Neurologic:   Awake, alert, oriented x 3. No apparent focal neurological           defect.       Results for orders placed or performed in visit on 11/28/17  POCT glycosylated hemoglobin (Hb A1C)  Result Value Ref Range   Hemoglobin A1C 8.1    Est. average glucose Bld gHb Est-mCnc 186        Assessment & Plan:     1. Type 2 diabetes mellitus without complication, without long-term current use of insulin (HCC) Doing well with initiation of Trulicity. Will reduce glipizide dose. Continue 0.75 Trulicity Follow up to check A1c in 2 months.  - POCT glycosylated hemoglobin (Hb A1C) - glipiZIDE (GLUCOTROL XL) 5 MG 24 hr tablet; Take 1 tablet (5 mg total) by mouth daily.   Dispense: 90 tablet; Refill: 1 - Dulaglutide (TRULICITY) 0.75 MG/0.5ML SOPN; Take 0.75 mg by mouth once a week.  Dispense: 4 pen; Refill: 3        Mila Merryonald Fisher, MD  [facility]

## 2018-01-10 ENCOUNTER — Other Ambulatory Visit: Payer: Self-pay | Admitting: Family Medicine

## 2018-01-10 DIAGNOSIS — E7800 Pure hypercholesterolemia, unspecified: Secondary | ICD-10-CM

## 2018-01-10 MED ORDER — LOVASTATIN 20 MG PO TABS: ORAL_TABLET | ORAL | 5 refills | Status: DC

## 2018-01-10 NOTE — Telephone Encounter (Signed)
Pt contacted office for refill request on the following medications:  lovastatin (MEVACOR) 20 MG tablet  [facility]  Last Rx: 02/28/17 LOV: 11/28/17 NOV: 02/28/18 Please advise. Thanks TNP

## 2018-01-16 DIAGNOSIS — H40013 Open angle with borderline findings, low risk, bilateral: Secondary | ICD-10-CM | POA: Diagnosis not present

## 2018-01-16 DIAGNOSIS — I10 Essential (primary) hypertension: Secondary | ICD-10-CM | POA: Diagnosis not present

## 2018-01-16 DIAGNOSIS — H40003 Preglaucoma, unspecified, bilateral: Secondary | ICD-10-CM | POA: Diagnosis not present

## 2018-01-16 DIAGNOSIS — Z0100 Encounter for examination of eyes and vision without abnormal findings: Secondary | ICD-10-CM | POA: Diagnosis not present

## 2018-01-16 DIAGNOSIS — E109 Type 1 diabetes mellitus without complications: Secondary | ICD-10-CM | POA: Diagnosis not present

## 2018-01-16 LAB — HM DIABETES EYE EXAM

## 2018-01-25 ENCOUNTER — Other Ambulatory Visit: Payer: Self-pay | Admitting: Family Medicine

## 2018-01-25 DIAGNOSIS — E119 Type 2 diabetes mellitus without complications: Secondary | ICD-10-CM

## 2018-01-25 MED ORDER — METFORMIN HCL 1000 MG PO TABS: 1000.0000 mg | ORAL_TABLET | Freq: Two times a day (BID) | ORAL | 11 refills | Status: DC

## 2018-01-25 NOTE — Telephone Encounter (Signed)
Pt contacted office for refill request on the following medications:  metFORMIN (GLUCOPHAGE) 1000 MG tablet   [facility]  Last Rx: 04/03/17 with 6 refills LOV: 11/28/17 Please advise. Thanks TNP

## 2018-02-27 NOTE — Progress Notes (Signed)
Patient: Anna Sweeney Female    DOB: 1951/03/10   67 y.o.   MRN: [id number] Visit Date: 02/28/2018  Today's Provider: Mila Merry, MD   Chief Complaint  Patient presents with  . Diabetes  . Hypertension   Subjective:    HPI   Diabetes Mellitus Type II, Follow-up:   Lab Results  Component Value Date   HGBA1C 8.1 11/28/2017   HGBA1C 8.5 10/24/2017   HGBA1C 7.8 06/22/2017   Last seen for diabetes 3 months ago.  Management since then includes; reduced glipizide. Continue 0.75 Trulicity. Follow up in 2 months to check A1c She reports good compliance with treatment. She is not having side effects.  Current symptoms include none and have been stable. Home blood sugar records: blood sugars are not checked   Wt Readings from Last 3 Encounters:  02/28/18 174 lb (78.9 kg)  11/28/17 177 lb (80.3 kg)  09/15/17 182 lb 8 oz (82.8 kg)    Episodes of hypoglycemia? no   Current Insulin Regimen: none Most Recent Eye Exam: <1 year ago Weight trend: fluctuating a bit Prior visit with dietician: no Current diet: in general, an "unhealthy" diet Current exercise: none  ------------------------------------------------------------------------   Hypertension, follow-up:  BP Readings from Last 3 Encounters:  02/28/18 120/60  11/28/17 120/68  10/24/17 104/60    She was last seen for hypertension 3 months ago.  BP at that visit was 104/60. Management since that visit includes; no changes.She reports good compliance with treatment. She is not having side effects.  She is not exercising. She is not adherent to low salt diet.   Outside blood pressures are not being checked. She is experiencing none.  Patient denies chest pain, chest pressure/discomfort, claudication, dyspnea, exertional chest pressure/discomfort, fatigue, irregular heart beat, lower extremity edema, near-syncope, orthopnea, palpitations, paroxysmal nocturnal dyspnea, syncope and tachypnea.   Cardiovascular  risk factors include advanced age (older than 26 for men, 85 for women), diabetes mellitus, hypertension and sedentary lifestyle.  Use of agents associated with hypertension: NSAIDS.   ------------------------------------------------------------------------    Allergies  Allergen Reactions  . Pioglitazone     Dizziness     Current Outpatient Medications:  .  aspirin EC 81 MG tablet, Take 81 mg by mouth daily., Disp: , Rfl:  .  betamethasone dipropionate (DIPROLENE) 0.05 % cream, Apply topically 2 (two) times daily as needed., Disp: 30 g, Rfl: 0 .  Dulaglutide (TRULICITY) 0.75 MG/0.5ML SOPN, Take 0.75 mg by mouth once a week., Disp: 4 pen, Rfl: 3 .  glipiZIDE (GLUCOTROL XL) 10 MG 24 hr tablet, Take 1 tablet (10 mg total) by mouth daily., .  lovastatin (MEVACOR) 20 MG tablet, TAKE 1 TABLET BY MOUTH (20 MG TOTAL) AT BEDTIME, Disp: 30 tablet, Rfl: 5 .  metFORMIN (GLUCOPHAGE) 1000 MG tablet, Take 1 tablet (1,000 mg total) by mouth 2 (two) times daily., Disp: 60 tablet, Rfl: 11 .  trandolapril (MAVIK) 4 MG tablet, TAKE 1 TABLET BY MOUTH (4 MG TOTAL) DAILY, Disp: 90 tablet, Rfl: 4 .  verapamil (VERELAN PM) 240 MG 24 hr capsule, Take 1 capsule (240 mg total) by mouth at bedtime., Disp: 90 capsule, Rfl: 3  Review of Systems  Constitutional: Negative for appetite change, chills, fatigue and fever.  Respiratory: Negative for chest tightness and shortness of breath.   Cardiovascular: Negative for chest pain and palpitations.  Gastrointestinal: Negative for abdominal pain, nausea and vomiting.  Neurological: Negative for dizziness and weakness.    Social History  Tobacco Use  . Smoking status: Former Smoker    Packs/day: 1.00    Years: 4.00    Pack years: 4.00  . Smokeless tobacco: Never Used  . Tobacco comment: quit in 1991  Substance Use Topics  . Alcohol use: No   Objective:   BP 120/60 (BP Location: Left Arm, Patient Position: Sitting, Cuff Size: Large)   Pulse 70   Temp 97.9  F (36.6 C) (Oral)   Resp 16   Ht 5\' 6"  (1.676 m)   Wt 174 lb (78.9 kg)   SpO2 98% Comment: room air  BMI 28.08 kg/m  Vitals:   02/28/18 0845  BP: 120/60  Pulse: 70  Resp: 16  Temp: 97.9 F (36.6 C)  TempSrc: Oral  SpO2: 98%  Weight: 174 lb (78.9 kg)  Height: 5\' 6"  (1.676 m)     Physical Exam  General appearance: alert, well developed, well nourished, cooperative and in no distress Head: Normocephalic, without obvious abnormality, atraumatic Respiratory: Respirations even and unlabored, normal respiratory rate    Results for orders placed or performed in visit on 02/28/18  POCT HgB A1C  Result Value Ref Range   Hemoglobin A1C 7.7 (A) 4.0 - 5.6 %   HbA1c POC (<> result, manual entry)  4.0 - 5.6 %   HbA1c, POC (prediabetic range)  5.7 - 6.4 %   HbA1c, POC (controlled diabetic range)  0.0 - 7.0 %   Est. average glucose Bld gHb Est-mCnc 174        Assessment & Plan:     1. Type 2 diabetes mellitus without complication, without long-term current use of insulin (HCC) Improving with Trulicity. Increase to 1.5mg  weekly and reduce glipizide to 5mg  daily.  - POCT HgB A1C - glipiZIDE (GLUCOTROL XL) 5 MG 24 hr tablet; Take 1 tablet (5 mg total) by mouth daily.  Dispense: 90 tablet; Refill: 1  Follow up 3 months for AWV and CPE      Mila Merryonald Fisher, MD  [facility]

## 2018-02-28 ENCOUNTER — Encounter: Payer: Self-pay | Admitting: Family Medicine

## 2018-02-28 ENCOUNTER — Ambulatory Visit (INDEPENDENT_AMBULATORY_CARE_PROVIDER_SITE_OTHER): Payer: Managed Care, Other (non HMO) | Admitting: Family Medicine

## 2018-02-28 VITALS — BP 120/60 | HR 70 | Temp 97.9°F | Resp 16 | Ht 66.0 in | Wt 174.0 lb

## 2018-02-28 DIAGNOSIS — Z6828 Body mass index (BMI) 28.0-28.9, adult: Secondary | ICD-10-CM

## 2018-02-28 DIAGNOSIS — E119 Type 2 diabetes mellitus without complications: Secondary | ICD-10-CM

## 2018-02-28 LAB — POCT GLYCOSYLATED HEMOGLOBIN (HGB A1C)
Est. average glucose Bld gHb Est-mCnc: 174
HEMOGLOBIN A1C: 7.7 % — AB (ref 4.0–5.6)

## 2018-02-28 MED ORDER — GLIPIZIDE ER 5 MG PO TB24: 5.0000 mg | ORAL_TABLET | Freq: Every day | ORAL | 1 refills | Status: DC

## 2018-02-28 MED ORDER — DULAGLUTIDE 1.5 MG/0.5ML ~~LOC~~ SOAJ: 1.5000 mg | SUBCUTANEOUS | 3 refills | Status: DC

## 2018-02-28 NOTE — Patient Instructions (Signed)
Please call the [facility] ([phone]) to schedule a routine screening mammogram.  

## 2018-03-16 ENCOUNTER — Other Ambulatory Visit: Payer: Self-pay | Admitting: Family Medicine

## 2018-03-16 DIAGNOSIS — Z1231 Encounter for screening mammogram for malignant neoplasm of breast: Secondary | ICD-10-CM

## 2018-04-06 ENCOUNTER — Ambulatory Visit
Admission: RE | Admit: 2018-04-06 | Discharge: 2018-04-06 | Disposition: A | Discharge status: Home / Self Care | Payer: Medicare HMO | Source: Ambulatory Visit | Attending: Family Medicine | Admitting: Family Medicine

## 2018-04-06 DIAGNOSIS — Z1231 Encounter for screening mammogram for malignant neoplasm of breast: Secondary | ICD-10-CM | POA: Diagnosis not present

## 2018-04-06 IMAGING — MG MM DIGITAL SCREENING BILAT W/ TOMO W/ CAD
8 series · 8 of 24 positions shown · non-contrast
Comparison: Previous exam(s).

CLINICAL DATA: Screening.

EXAM:
DIGITAL SCREENING BILATERAL MAMMOGRAM WITH TOMO AND CAD

[R CC synth-2D]
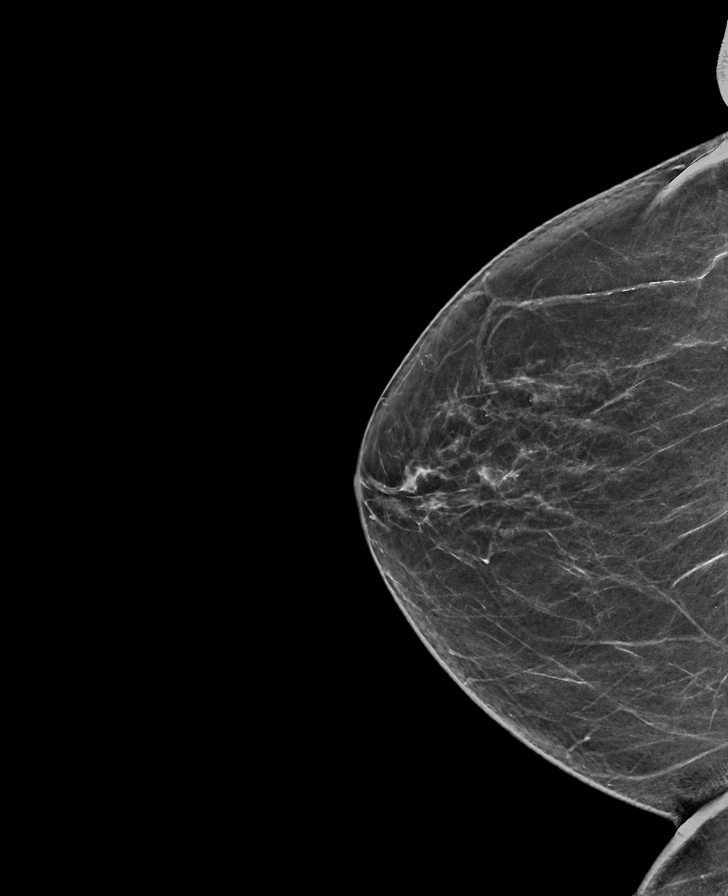

[L MLO synth-2D]
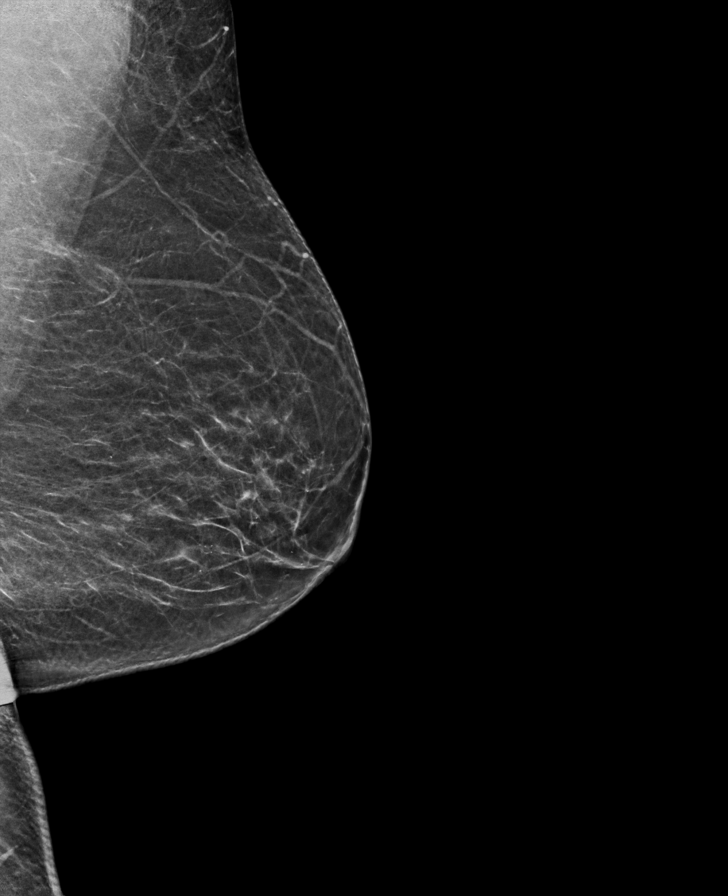

[L CC synth-2D]
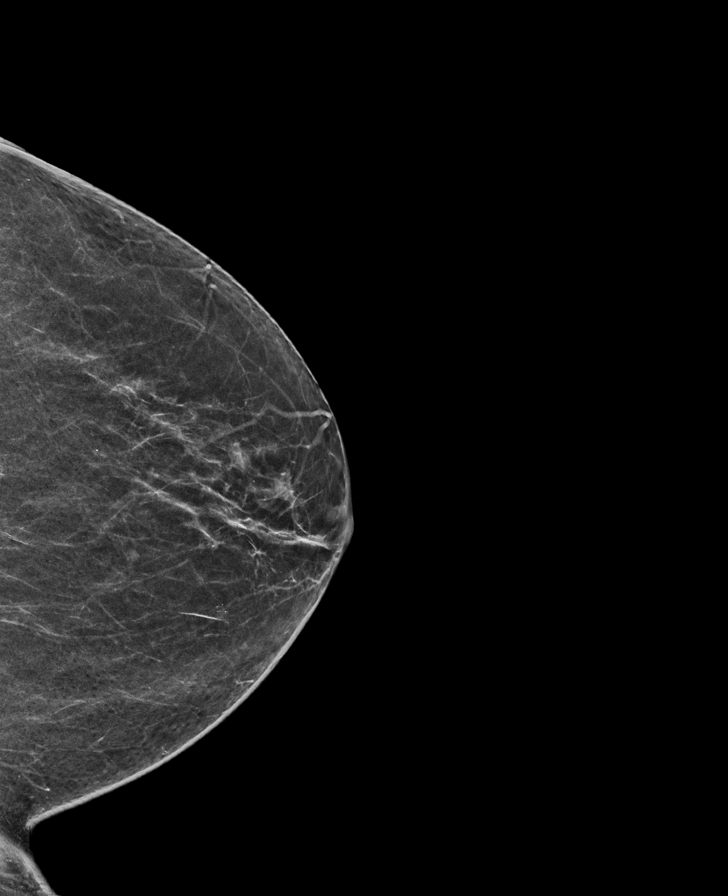

[R MLO synth-2D]
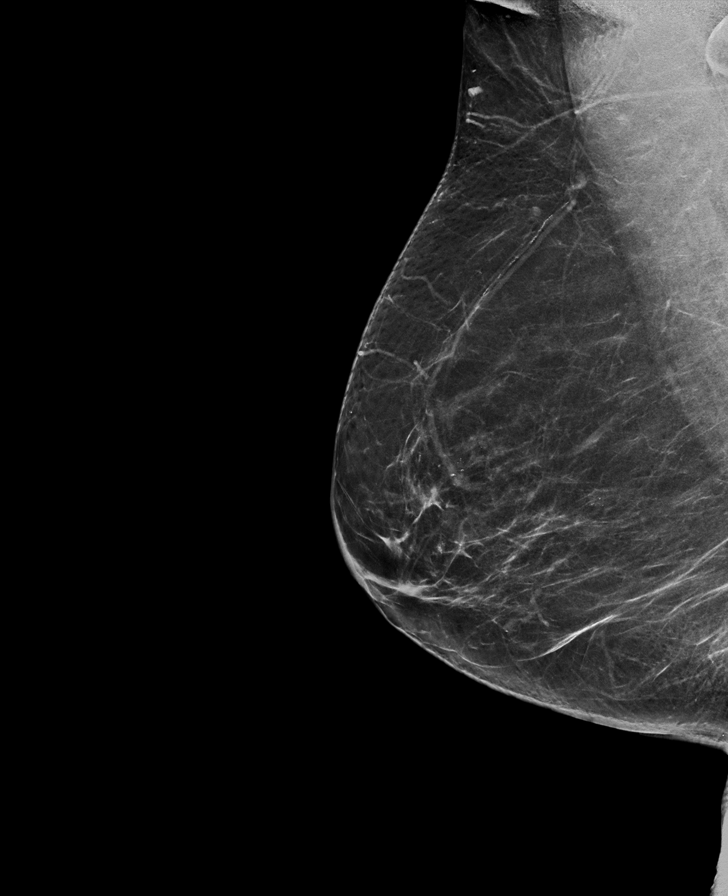

[L MLO tomo · tomo slice 31/61.0]
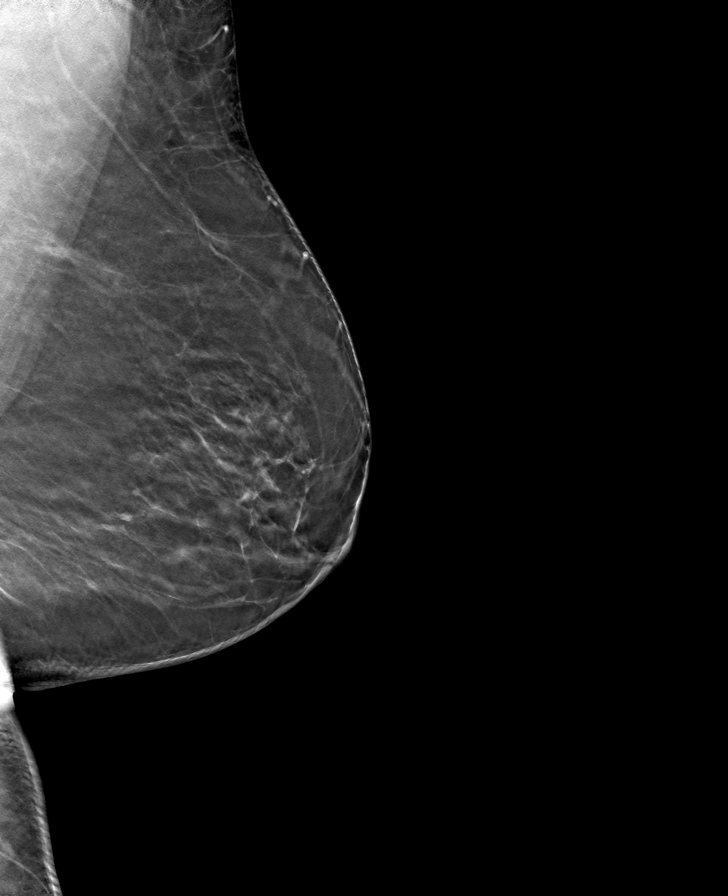

[L CC tomo · tomo slice 27/53.0]
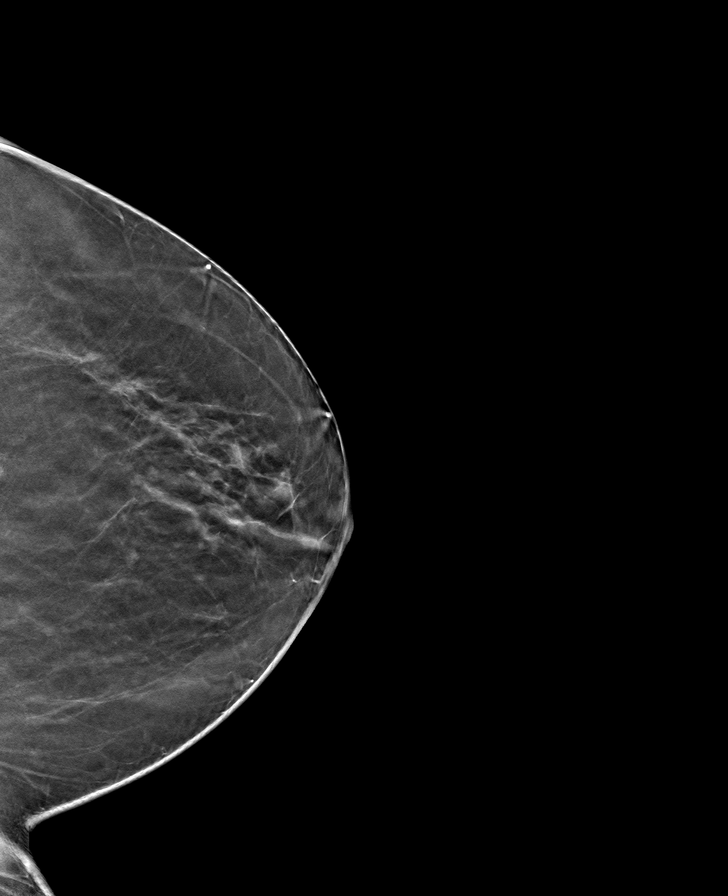

[R MLO tomo · tomo slice 34/67.0]
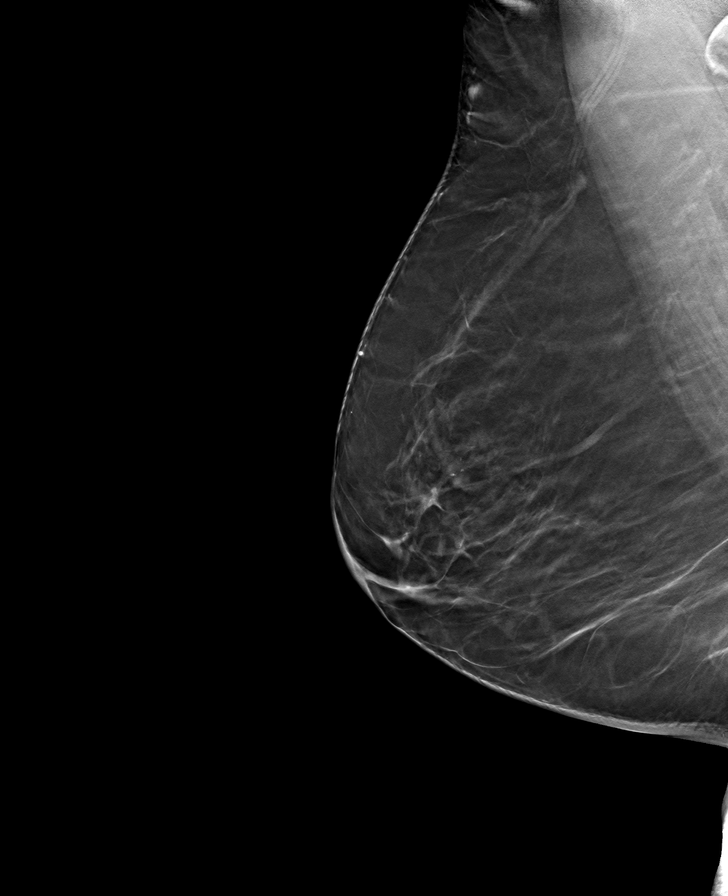

[R CC tomo · tomo slice 32/63.0]
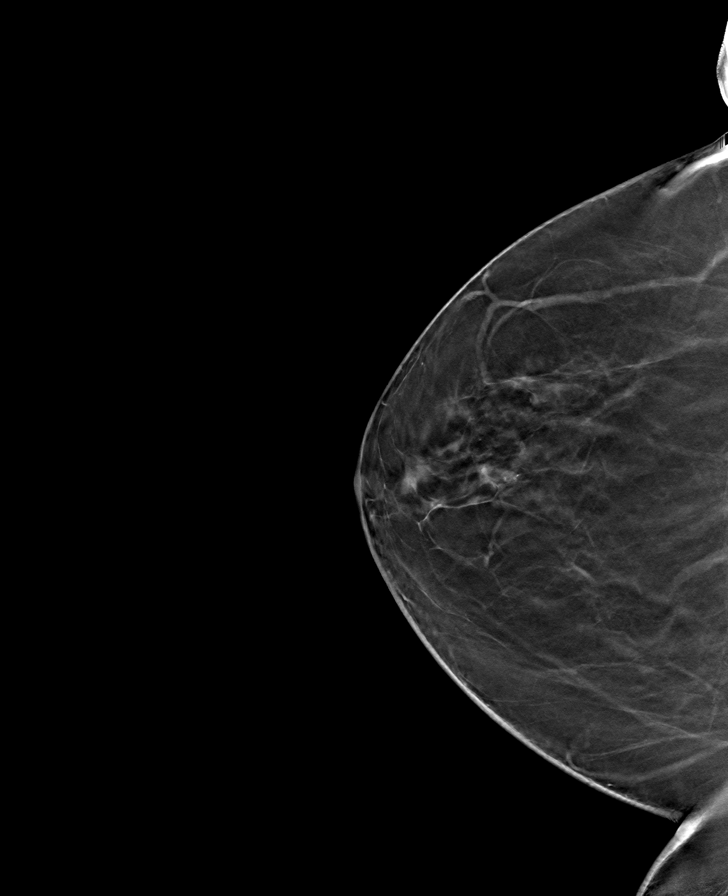

[8 of 24 positions shown; findings below may reference images not displayed]

ACR Breast Density Category b: There are scattered areas of
fibroglandular density.
FINDINGS: There are no findings suspicious for malignancy. Images were
processed with CAD.
IMPRESSION: No mammographic evidence of malignancy. A result letter of this
screening mammogram will be mailed directly to the patient.

RECOMMENDATION:
Screening mammogram in one year. (Code:[TQ])

BI-RADS CATEGORY  1: Negative.

## 2018-04-12 ENCOUNTER — Telehealth: Payer: Self-pay | Admitting: Family Medicine

## 2018-04-12 MED ORDER — DULAGLUTIDE 1.5 MG/0.5ML ~~LOC~~ SOAJ: 1.5000 mg | SUBCUTANEOUS | 3 refills | Status: DC

## 2018-04-12 NOTE — Telephone Encounter (Signed)
Pt needs refill on   Trulicity 1.5mg /47ml SOPN  [facility]  CB# [phone]  Thanks Barth Kirks

## 2018-05-10 ENCOUNTER — Other Ambulatory Visit: Payer: Self-pay | Admitting: Family Medicine

## 2018-05-10 DIAGNOSIS — E119 Type 2 diabetes mellitus without complications: Secondary | ICD-10-CM

## 2018-05-10 MED ORDER — METFORMIN HCL 1000 MG PO TABS: 1000.0000 mg | ORAL_TABLET | Freq: Two times a day (BID) | ORAL | 11 refills | Status: DC

## 2018-05-10 NOTE — Telephone Encounter (Signed)
[facility] faxed refill request for the following medications:  metFORMIN (GLUCOPHAGE) 1000 MG tablet  Please advise.

## 2018-05-10 NOTE — Telephone Encounter (Signed)
[facility] faxed refill request for the following medications:  trandolapril (MAVIK) 4 MG tablet  glipiZIDE (GLUCOTROL XL) 5 MG 24 hr tablet  lovastatin (MEVACOR) 20 MG tablet  Please advise.

## 2018-05-11 ENCOUNTER — Telehealth: Payer: Self-pay | Admitting: Family Medicine

## 2018-05-11 NOTE — Telephone Encounter (Signed)
Does pt need refills?

## 2018-05-11 NOTE — Telephone Encounter (Signed)
Pt called saying that she will be using [facility] for her prescription drugs except for the Trulicity and [facility]apamil [facility] [place]

## 2018-05-11 NOTE — Telephone Encounter (Signed)
Error  Last message

## 2018-05-11 NOTE — Telephone Encounter (Signed)
Pt called saying [facility] will be her ordering pharmacy for her prescriptions.  [facility] [employer]

## 2018-05-12 ENCOUNTER — Other Ambulatory Visit: Payer: Self-pay | Admitting: Family Medicine

## 2018-05-12 DIAGNOSIS — E119 Type 2 diabetes mellitus without complications: Secondary | ICD-10-CM

## 2018-05-12 DIAGNOSIS — I10 Essential (primary) hypertension: Secondary | ICD-10-CM

## 2018-05-12 DIAGNOSIS — E7800 Pure hypercholesterolemia, unspecified: Secondary | ICD-10-CM

## 2018-05-12 MED ORDER — LOVASTATIN 20 MG PO TABS: ORAL_TABLET | ORAL | 5 refills | Status: DC

## 2018-05-12 MED ORDER — TRANDOLAPRIL 4 MG PO TABS: ORAL_TABLET | ORAL | 4 refills | Status: DC

## 2018-05-12 MED ORDER — GLIPIZIDE ER 5 MG PO TB24: 5.0000 mg | ORAL_TABLET | Freq: Every day | ORAL | 1 refills | Status: DC

## 2018-05-12 MED ORDER — METFORMIN HCL 1000 MG PO TABS: 1000.0000 mg | ORAL_TABLET | Freq: Two times a day (BID) | ORAL | 11 refills | Status: DC

## 2018-05-12 NOTE — Telephone Encounter (Signed)
Please review. Thanks!  

## 2018-05-12 NOTE — Telephone Encounter (Signed)
[facility] faxed refill request for the following medications:  metFORMIN (GLUCOPHAGE) 1000 MG tablet  trandolapril (MAVIK) 4 MG tablet   glipiZIDE (GLUCOTROL XL) 5 MG 24 hr tablet  lovastatin (MEVACOR) 20 MG tablet  Please advise.

## 2018-05-15 NOTE — Telephone Encounter (Signed)
This is for future reference.  I told her when she called in for refills to please let the receptionist know which pharmacy she would like at that time also.  Thank s Barth Kirks

## 2018-05-15 NOTE — Telephone Encounter (Signed)
FYI

## 2018-05-31 ENCOUNTER — Ambulatory Visit: Payer: Self-pay

## 2018-05-31 ENCOUNTER — Encounter: Payer: Self-pay | Admitting: Family Medicine

## 2018-06-17 ENCOUNTER — Other Ambulatory Visit: Payer: Self-pay | Admitting: Family Medicine

## 2018-06-17 DIAGNOSIS — I10 Essential (primary) hypertension: Secondary | ICD-10-CM

## 2018-07-14 DIAGNOSIS — M722 Plantar fascial fibromatosis: Secondary | ICD-10-CM | POA: Diagnosis not present

## 2018-07-17 ENCOUNTER — Ambulatory Visit (INDEPENDENT_AMBULATORY_CARE_PROVIDER_SITE_OTHER): Payer: Medicare HMO | Admitting: Family Medicine

## 2018-07-17 ENCOUNTER — Ambulatory Visit (INDEPENDENT_AMBULATORY_CARE_PROVIDER_SITE_OTHER): Payer: Medicare HMO

## 2018-07-17 VITALS — BP 124/58 | HR 90 | Temp 98.7°F | Ht 66.0 in | Wt 178.2 lb

## 2018-07-17 DIAGNOSIS — E7800 Pure hypercholesterolemia, unspecified: Secondary | ICD-10-CM

## 2018-07-17 DIAGNOSIS — Z1159 Encounter for screening for other viral diseases: Secondary | ICD-10-CM

## 2018-07-17 DIAGNOSIS — N183 Chronic kidney disease, stage 3 unspecified: Secondary | ICD-10-CM

## 2018-07-17 DIAGNOSIS — Z23 Encounter for immunization: Secondary | ICD-10-CM

## 2018-07-17 DIAGNOSIS — I10 Essential (primary) hypertension: Secondary | ICD-10-CM

## 2018-07-17 DIAGNOSIS — E119 Type 2 diabetes mellitus without complications: Secondary | ICD-10-CM

## 2018-07-17 DIAGNOSIS — Z6828 Body mass index (BMI) 28.0-28.9, adult: Secondary | ICD-10-CM | POA: Diagnosis not present

## 2018-07-17 DIAGNOSIS — Z0000 Encounter for general adult medical examination without abnormal findings: Secondary | ICD-10-CM

## 2018-07-17 NOTE — Progress Notes (Signed)
Patient: Anna Sweeney, Female    DOB: 1950/09/28, 67 y.o.   MRN: [id number] Visit Date: 07/17/2018  Today's Provider: Lelon Huh, MD   Chief Complaint  Patient presents with  . Annual Exam  . Diabetes  . Hypertension   Subjective:     Complete Physical Anna Sweeney is a 67 y.o. female. She feels fairly well. She reports no regular exercising. She reports she is sleeping fairly well.  -----------------------------------------------------------  Diabetes Mellitus Type II, Follow-up:   Lab Results  Component Value Date   HGBA1C 7.7 (A) 02/28/2018   HGBA1C 8.1 11/28/2017   HGBA1C 8.5 10/24/2017    Last seen for diabetes 4 months ago.  Management since then includes increased Trulicity to 7.0JG weekly, and decreased Glipizide to 28m daily. She reports good compliance with treatment. She is not having side effects.  Current symptoms include none and have been stable. Home blood sugar records: blood sugars are not checked at home  Episodes of hypoglycemia? no   Most Recent Eye Exam: 01/16/2018 Weight trend: fluctuating a bit Prior visit with dietician: no Current diet: in general, an "unhealthy" diet Current exercise: none  Pertinent Labs:    Component Value Date/Time   CHOL 171 11/16/2016 1051   TRIG 219 (H) 11/16/2016 1051   HDL 37 (L) 11/16/2016 1051   LDLCALC 90 11/16/2016 1051   CREATININE 1.50 (H) 08/18/2017 1251    Wt Readings from Last 3 Encounters:  07/17/18 178 lb 3.2 oz (80.8 kg)  02/28/18 174 lb (78.9 kg)  11/28/17 177 lb (80.3 kg)    ------------------------------------------------------------------------  Hypertension, follow-up:  BP Readings from Last 3 Encounters:  07/17/18 (!) 124/58  02/28/18 120/60  11/28/17 120/68    She was last seen for hypertension 9 months ago.  BP at that visit was 104/60. Management since that visit includes no changes. She reports good compliance with treatment. She is not having side effects.    She is not exercising. She is adherent to low salt diet.   Outside blood pressures are not checked. She is experiencing none.  Patient denies chest pain, chest pressure/discomfort, claudication, dyspnea, exertional chest pressure/discomfort, fatigue, irregular heart beat, lower extremity edema, near-syncope, orthopnea, palpitations, paroxysmal nocturnal dyspnea, syncope and tachypnea.   Cardiovascular risk factors include advanced age (older than 570for men, 624for women), diabetes mellitus and hypertension.  Use of agents associated with hypertension: NSAIDS.     Weight trend: fluctuating a bit Wt Readings from Last 3 Encounters:  07/17/18 178 lb 3.2 oz (80.8 kg)  02/28/18 174 lb (78.9 kg)  11/28/17 177 lb (80.3 kg)    Current diet: in general, an "unhealthy" diet  ------------------------------------------------------------------------   Review of Systems  Constitutional: Positive for appetite change. Negative for chills, fatigue and fever.  HENT: Negative for congestion, ear pain, rhinorrhea, sneezing and sore throat.   Eyes: Negative.  Negative for pain and redness.  Respiratory: Negative for cough, shortness of breath and wheezing.   Cardiovascular: Negative for chest pain and leg swelling.  Gastrointestinal: Negative for abdominal pain, blood in stool, constipation, diarrhea and nausea.  Endocrine: Negative for polydipsia and polyphagia.  Genitourinary: Negative.  Negative for dysuria, flank pain, hematuria, pelvic pain, vaginal bleeding and vaginal discharge.  Musculoskeletal: Positive for back pain. Negative for arthralgias, gait problem and joint swelling.  Skin: Negative for rash.  Neurological: Negative.  Negative for dizziness, tremors, seizures, weakness, light-headedness, numbness and headaches.  Hematological: Negative for adenopathy.  Psychiatric/Behavioral: Negative.  Negative for behavioral problems, confusion and dysphoric mood. The patient is not  nervous/anxious and is not hyperactive.     Social History   Socioeconomic History  . Marital status: Widowed    Spouse name: Not on file  . Number of children: 2  . Years of education: Not on file  . Highest education level: Some college, no degree  Occupational History  . Occupation: retired  Scientific laboratory technician  . Financial resource strain: Not hard at all  . Food insecurity:    Worry: Never true    Inability: Never true  . Transportation needs:    Medical: No    Non-medical: No  Tobacco Use  . Smoking status: Former Smoker    Packs/day: 1.00    Years: 4.00    Pack years: 4.00  . Smokeless tobacco: Never Used  . Tobacco comment: quit in 1991  Substance and Sexual Activity  . Alcohol use: No  . Drug use: No  . Sexual activity: Not on file  Lifestyle  . Physical activity:    Days per week: 0 days    Minutes per session: 0 min  . Stress: Not at all  Relationships  . Social connections:    Talks on phone: Patient refused    Gets together: Patient refused    Attends religious service: Patient refused    Active member of club or organization: Patient refused    Attends meetings of clubs or organizations: Patient refused    Relationship status: Patient refused  . Intimate partner violence:    Fear of current or ex partner: Patient refused    Emotionally abused: Patient refused    Physically abused: Patient refused    Forced sexual activity: Patient refused  Other Topics Concern  . Not on file  Social History Narrative  . Not on file    Past Medical History:  Diagnosis Date  . BRCA negative 11/15/2012   [facility]  . Diabetes mellitus without complication ([place])   . History of chicken pox   . Hypertension      Patient Active Problem List   Diagnosis Date Noted  . Elevated transaminase level 11/17/2016  . Nodule of left lung 05/13/2015  . 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 Eczema 04/30/2015  . High cholesterol   . Family history of malignant neoplasm of breast   .  Hypertension   . Chronic kidney disease (CKD), stage III (moderate) ([place]) 05/18/2011  . Seborrhea 07/21/2009  . Diabetes mellitus, type 2 ([place]) 01/15/2009    Past Surgical History:  Procedure Laterality Date  . FOOT SURGERY      Her family history includes Breast cancer in her mother and sister; Cancer in her sister.      Current Outpatient Medications:  .  aspirin EC 81 MG tablet, Take 81 mg by mouth daily., Disp: , Rfl:  .  betamethasone dipropionate (DIPROLENE) 0.05 % cream, Apply topically 2 (two) times daily as needed., Disp: 30 g, Rfl: 0 .  Dulaglutide (TRULICITY) 1.5 KZ/6.0FU SOPN, Inject 1.5 mg into the skin once a week., Disp: 4 pen, Rfl: 3 .  glipiZIDE (GLUCOTROL XL) 5 MG 24 hr tablet, Take 1 tablet (5 mg total) by mouth daily., Disp: 90 tablet, Rfl: 1 .  lovastatin (MEVACOR) 20 MG tablet, TAKE 1 TABLET BY MOUTH (20 MG TOTAL) AT BEDTIME, Disp: 30 tablet, Rfl: 5 .  metFORMIN (GLUCOPHAGE) 1000 MG tablet, Take 1 tablet (1,000 mg total) by mouth 2 (two) times daily., Disp: 60 tablet, Rfl:  11 .  trandolapril (MAVIK) 4 MG tablet, TAKE 1 TABLET BY MOUTH (4 MG TOTAL) DAILY, Disp: 90 tablet, Rfl: 4 .  verapamil (VERELAN PM) 240 MG 24 hr capsule, Take 1 capsule (240 mg total) by mouth at bedtime., Disp: 90 capsule, Rfl: 3  Patient Care Team: [name], MD as PCP - General (Family Medicine) Garth Bigness as Referring Physician (Chiropractic Medicine)     Objective:   Vitals:  Today's Vitals   07/17/18 1327  BP: (!) 124/58  Pulse: 90  Temp: 98.7 F (37.1 C)  TempSrc: Oral  Weight: 178 lb 3.2 oz (80.8 kg)  Height: 5' 6" (1.676 m)  PainSc: 0-No pain   Body mass index is 28.76 kg/m  Physical Exam    General Appearance:    Alert, cooperative, no distress, appears stated age  Head:    Normocephalic, without obvious abnormality, atraumatic  Eyes:    PERRL, conjunctiva/corneas clear, EOM's intact, fundi    benign, both eyes  Ears:    Normal TM's and external ear  canals, both ears  Nose:   Nares normal, septum midline, mucosa normal, no drainage    or sinus tenderness  Throat:   Lips, mucosa, and tongue normal; teeth and gums normal  Neck:   Supple, symmetrical, trachea midline, no adenopathy;    thyroid:  no enlargement/tenderness/nodules; no carotid   bruit or JVD  Back:     Symmetric, no curvature, ROM normal, no CVA tenderness  Lungs:     Clear to auscultation bilaterally, respirations unlabored  Chest Wall:    No tenderness or deformity   Heart:    Regular rate and rhythm, S1 and S2 normal, no murmur, rub   or gallop  Breast Exam:    normal appearance, no masses or tenderness  Abdomen:     Soft, non-tender, bowel sounds active all four quadrants,    no masses, no organomegaly  Pelvic:    deferred  Extremities:   Extremities normal, atraumatic, no cyanosis or edema  Pulses:   2+ and symmetric all extremities  Skin:   Skin color, texture, turgor normal, no rashes or lesions  Lymph nodes:   Cervical, supraclavicular, and axillary nodes normal  Neurologic:   CNII-XII intact, normal strength, sensation and reflexes    throughout    Activities of Daily Living In your present state of health, do you have any difficulty performing the following activities: 07/17/2018  Hearing? N  Vision? N  Comment Wear eye glasses.   Difficulty concentrating or making decisions? N  Walking or climbing stairs? Y  Comment Due to hip stiffness and foot pain.   Dressing or bathing? N  Doing errands, shopping? N  Preparing Food and eating ? N  Using the Toilet? N  In the past six months, have you accidently leaked urine? N  Do you have problems with loss of bowel control? N  Managing your Medications? N  Managing your Finances? N  Housekeeping or managing your Housekeeping? N  Some recent data might be hidden    Fall Risk Assessment Fall Risk  07/17/2018 11/28/2017 11/16/2016  Falls in the past year? 0 No No     Depression Screen PHQ 2/9 Scores  07/17/2018 11/28/2017 11/16/2016 07/17/2015  PHQ - 2 Score 1 0 0 0  PHQ- 9 Score - 0 2 0    No flowsheet data found.    Assessment & Plan:    Annual Physical Reviewed patient's Family Medical History Reviewed and updated  list of patient's medical providers Assessment of cognitive impairment was done Assessed patient's functional ability Established a written schedule for health screening [place] Completed and Reviewed  Exercise Activities and Dietary recommendations Goals    . DIET - INCREASE WATER INTAKE     Recommend increasing water intake to at least 3 glasses every day.        Immunization History  Administered Date(s) Administered  . Influenza, High Dose Seasonal PF 04/19/2018  . Influenza,inj,Quad PF,6+ Mos 07/17/2015, 07/09/2016  . Pneumococcal Conjugate-13 11/16/2016  . Pneumococcal Polysaccharide-23 09/09/2011  . Tdap 07/10/2013    Health Maintenance  Topic Date Due  . Hepatitis C Screening  February 15, 1951  . FOOT EXAM  08/12/1961  . PNA vac Low Risk Adult (2 of 2 - PPSV23) 11/16/2017  . HEMOGLOBIN A1C  08/31/2018  . OPHTHALMOLOGY EXAM  01/17/2019  . Fecal DNA (Cologuard)  02/02/2019  . MAMMOGRAM  04/06/2020  . DEXA SCAN  08/11/2022  . TETANUS/TDAP  07/11/2023  . INFLUENZA VACCINE  Completed     Discussed health benefits of physical activity, and encouraged her to engage in regular exercise appropriate for her age and condition.    ------------------------------------------------------------------------------------------------------------  1. Annual physical exam Doing well, unremarkable exam. She refused flu vaccine which was recommended.   2. BMI 28.0-28.9,adult  3. Need for hepatitis C screening test  - Hepatitis C antibody  4. Essential hypertension Well controlled.  Continue current medications.    5. Type 2 diabetes mellitus without complication, without long-term current use of insulin (HCC)  - Hemoglobin A1c  6.  High cholesterol She is tolerating lovastatin well with no adverse effects.   - Comprehensive metabolic panel - Lipid panel  7. Chronic kidney disease (CKD), stage III (moderate) (HCC) Stable. Check labs as above.   8. Need for pneumococcal vaccination  - Pneumococcal polysaccharide vaccine 23-valent greater than or equal to 2yo subcutaneous/IM   Lelon Huh, MD  [facility]

## 2018-07-17 NOTE — Patient Instructions (Addendum)
Anna Sweeney , Thank you for taking time to come for your Medicare Wellness Visit. I appreciate your ongoing commitment to your health goals. Please review the following plan we discussed and let me know if I can assist you in the future.   Screening recommendations/referrals: Colonoscopy: Cologuard due 02/02/19  Mammogram: Up to date, due 04/2020 Bone Density: Up to date, due 08/2022 Recommended yearly ophthalmology/optometry visit for glaucoma screening and checkup Recommended yearly dental visit for hygiene and checkup  Vaccinations: Influenza vaccine: Up to date Pneumococcal vaccine: Prevnar 13 vaccine up to date, declined Pneumovax 23 today Tdap vaccine: Up to date, due 07/2023 Shingles vaccine: Pt declines today.     Advanced directives: Please bring a copy of your POA (Power of Attorney) and/or Living Will to the office once completed.   Conditions/risks identified: Recommend increasing water intake to at least 3 glasses every day.   Next appointment: 2:00 PM today with Dr Sherrie MustacheFisher.    Preventive Care 3165 Years and Older, Female Preventive care refers to lifestyle choices and visits with your health care provider that can promote health and wellness. What does preventive care include?  A yearly physical exam. This is also called an annual well check.  Dental exams once or twice a year.  Routine eye exams. Ask your health care provider how often you should have your eyes checked.  Personal lifestyle choices, including:  Daily care of your teeth and gums.  Regular physical activity.  Eating a healthy diet.  Avoiding tobacco and drug use.  Limiting alcohol use.  Practicing safe sex.  Taking low-dose aspirin every day.  Taking vitamin and mineral supplements as recommended by your health care provider. What happens during an annual well check? The services and screenings done by your health care provider during your annual well check will depend on your age, overall  health, lifestyle risk factors, and family history of disease. Counseling  Your health care provider may ask you questions about your:  Alcohol use.  Tobacco use.  Drug use.  Emotional well-being.  Home and relationship well-being.  Sexual activity.  Eating habits.  History of falls.  Memory and ability to understand (cognition).  Work and work Astronomerenvironment.  Reproductive health. Screening  You may have the following tests or measurements:  Height, weight, and BMI.  Blood pressure.  Lipid and cholesterol levels. These may be checked every 5 years, or more frequently if you are over 66 years old.  Skin check.  Lung cancer screening. You may have this screening every year starting at age 66 if you have a 30-pack-year history of smoking and currently smoke or have quit within the past 15 years.  Fecal occult blood test (FOBT) of the stool. You may have this test every year starting at age 66.  Flexible sigmoidoscopy or colonoscopy. You may have a sigmoidoscopy every 5 years or a colonoscopy every 10 years starting at age 850.  Hepatitis C blood test.  Hepatitis B blood test.  Sexually transmitted disease (STD) testing.  Diabetes screening. This is done by checking your blood sugar (glucose) after you have not eaten for a while (fasting). You may have this done every 1-3 years.  Bone density scan. This is done to screen for osteoporosis. You may have this done starting at age 66.  Mammogram. This may be done every 1-2 years. Talk to your health care provider about how often you should have regular mammograms. Talk with your health care provider about your test results, treatment  options, and if necessary, the need for more tests. Vaccines  Your health care provider may recommend certain vaccines, such as:  Influenza vaccine. This is recommended every year.  Tetanus, diphtheria, and acellular pertussis (Tdap, Td) vaccine. You may need a Td booster every 10  years.  Zoster vaccine. You may need this after age 61.  Pneumococcal 13-valent conjugate (PCV13) vaccine. One dose is recommended after age 54.  Pneumococcal polysaccharide (PPSV23) vaccine. One dose is recommended after age 1. Talk to your health care provider about which screenings and vaccines you need and how often you need them. This information is not intended to replace advice given to you by your health care provider. Make sure you discuss any questions you have with your health care provider. Document Released: 08/22/2015 Document Revised: 04/14/2016 Document Reviewed: 05/27/2015 Elsevier Interactive Patient Education  2017 [place] Prevention in the Home Falls can cause injuries. They can happen to people of all ages. There are many things you can do to make your home safe and to help prevent falls. What can I do on the outside of my home?  Regularly fix the edges of walkways and driveways and fix any cracks.  Remove anything that might make you trip as you walk through a door, such as a raised step or threshold.  Trim any bushes or trees on the path to your home.  Use bright outdoor lighting.  Clear any walking paths of anything that might make someone trip, such as rocks or tools.  Regularly check to see if handrails are loose or broken. Make sure that both sides of any steps have handrails.  Any raised decks and porches should have guardrails on the edges.  Have any leaves, snow, or ice cleared regularly.  Use sand or salt on walking paths during winter.  Clean up any spills in your garage right away. This includes oil or grease spills. What can I do in the bathroom?  Use night lights.  Install grab bars by the toilet and in the tub and shower. Do not use towel bars as grab bars.  Use non-skid mats or decals in the tub or shower.  If you need to sit down in the shower, use a plastic, non-slip stool.  Keep the floor dry. Clean up any water that  spills on the floor as soon as it happens.  Remove soap buildup in the tub or shower regularly.  Attach bath mats securely with double-sided non-slip rug tape.  Do not have throw rugs and other things on the floor that can make you trip. What can I do in the bedroom?  Use night lights.  Make sure that you have a light by your bed that is easy to reach.  Do not use any sheets or blankets that are too big for your bed. They should not hang down onto the floor.  Have a firm chair that has side arms. You can use this for support while you get dressed.  Do not have throw rugs and other things on the floor that can make you trip. What can I do in the kitchen?  Clean up any spills right away.  Avoid walking on wet floors.  Keep items that you use a lot in easy-to-reach places.  If you need to reach something above you, use a strong step stool that has a grab bar.  Keep electrical cords out of the way.  Do not use floor polish or wax that makes floors slippery.  If you must use wax, use non-skid floor wax.  Do not have throw rugs and other things on the floor that can make you trip. What can I do with my stairs?  Do not leave any items on the stairs.  Make sure that there are handrails on both sides of the stairs and use them. Fix handrails that are broken or loose. Make sure that handrails are as long as the stairways.  Check any carpeting to make sure that it is firmly attached to the stairs. Fix any carpet that is loose or worn.  Avoid having throw rugs at the top or bottom of the stairs. If you do have throw rugs, attach them to the floor with carpet tape.  Make sure that you have a light switch at the top of the stairs and the bottom of the stairs. If you do not have them, ask someone to add them for you. What else can I do to help prevent falls?  Wear shoes that:  Do not have high heels.  Have rubber bottoms.  Are comfortable and fit you well.  Are closed at the  toe. Do not wear sandals.  If you use a stepladder:  Make sure that it is fully opened. Do not climb a closed stepladder.  Make sure that both sides of the stepladder are locked into place.  Ask someone to hold it for you, if possible.  Clearly mark and make sure that you can see:  Any grab bars or handrails.  First and last steps.  Where the edge of each step is.  Use tools that help you move around (mobility aids) if they are needed. These include:  Canes.  Walkers.  Scooters.  Crutches.  Turn on the lights when you go into a dark area. Replace any light bulbs as soon as they burn out.  Set up your furniture so you have a clear path. Avoid moving your furniture around.  If any of your floors are uneven, fix them.  If there are any pets around you, be aware of where they are.  Review your medicines with your doctor. Some medicines can make you feel dizzy. This can increase your chance of falling. Ask your doctor what other things that you can do to help prevent falls. This information is not intended to replace advice given to you by your health care provider. Make sure you discuss any questions you have with your health care provider. Document Released: 05/22/2009 Document Revised: 01/01/2016 Document Reviewed: 08/30/2014 Elsevier Interactive Patient Education  2017 [employer].

## 2018-07-17 NOTE — Progress Notes (Signed)
Subjective:   Anna Sweeney is a 67 y.o. female who presents for an Initial Medicare Annual Wellness Visit.  Review of Systems    N/A  Cardiac Risk Factors include: advanced age (>8mn, >>49women);diabetes mellitus;dyslipidemia;hypertension     Objective:    Today's Vitals   07/17/18 1327  BP: (!) 124/58  Pulse: 90  Temp: 98.7 F (37.1 C)  TempSrc: Oral  Weight: 178 lb 3.2 oz (80.8 kg)  Height: 5' 6"  (1.676 m)  PainSc: 0-No pain   Body mass index is 28.76 kg/m.  Advanced Directives 07/17/2018 08/18/2017  Does Patient Have a Medical Advance Directive? No No  Would patient like information on creating a medical advance directive? No - Patient declined -    Current Medications (verified) Outpatient Encounter Medications as of 07/17/2018  Medication Sig  . aspirin EC 81 MG tablet Take 81 mg by mouth daily.  . betamethasone dipropionate (DIPROLENE) 0.05 % cream Apply topically 2 (two) times daily as needed.  . Dulaglutide (TRULICITY) 1.5 MQM/2.5OISOPN Inject 1.5 mg into the skin once a week.  .Marland KitchenglipiZIDE (GLUCOTROL XL) 5 MG 24 hr tablet Take 1 tablet (5 mg total) by mouth daily.  .Marland Kitchenlovastatin (MEVACOR) 20 MG tablet TAKE 1 TABLET BY MOUTH (20 MG TOTAL) AT BEDTIME  . metFORMIN (GLUCOPHAGE) 1000 MG tablet Take 1 tablet (1,000 mg total) by mouth 2 (two) times daily.  . trandolapril (MAVIK) 4 MG tablet TAKE 1 TABLET BY MOUTH (4 MG TOTAL) DAILY  . verapamil (VERELAN PM) 240 MG 24 hr capsule Take 1 capsule (240 mg total) by mouth at bedtime.  . trandolapril ([place] 4 MG tablet TAKE 1 TABLET BY MOUTH (4 MG TOTAL) DAILY (Patient not taking: Reported on 07/17/2018)   No facility-administered encounter medications on file as of 07/17/2018.     Allergies (verified) Pioglitazone   History: Past Medical History:  Diagnosis Date  . BRCA negative 11/15/2012   [facility]  . Diabetes mellitus without complication ([facility]   . History of chicken pox   . Hypertension    Past Surgical  History:  Procedure Laterality Date  . FOOT SURGERY     Family History  Problem Relation Age of Onset  . Breast cancer Mother   . Breast cancer Sister   . Cancer Sister        renal    Social History   Socioeconomic History  . Marital status: Widowed    Spouse name: Not on file  . Number of children: 2  . Years of education: Not on file  . Highest education level: Some college, no degree  Occupational History  . Occupation: retired  SScientific laboratory technician . Financial resource strain: Not hard at all  . Food insecurity:    Worry: Never true    Inability: Never true  . Transportation needs:    Medical: No    Non-medical: No  Tobacco Use  . Smoking status: Former Smoker    Packs/day: 1.00    Years: 4.00    Pack years: 4.00  . Smokeless tobacco: Never Used  . Tobacco comment: quit in 1991  Substance and Sexual Activity  . Alcohol use: No  . Drug use: No  . Sexual activity: Not on file  Lifestyle  . Physical activity:    Days per week: 0 days    Minutes per session: 0 min  . Stress: Not at all  Relationships  . Social connections:    Talks on phone: Patient refused  Gets together: Patient refused    Attends religious service: Patient refused    Active member of club or organization: Patient refused    Attends meetings of clubs or organizations: Patient refused    Relationship status: Patient refused  Other Topics Concern  . Not on file  Social History Narrative  . Not on file    Tobacco Counseling Counseling given: Not Answered Comment: quit in 1991   Clinical Intake:  Pre-visit preparation completed: Yes  Pain : No/denies pain Pain Score: 0-No pain    Diabetes:  Is the patient diabetic?  Yes type 2 If diabetic, was a CBG obtained today?  No  Did the patient bring in their glucometer from home?  No  How often do you monitor your CBG's? Does not.   Financial Strains and Diabetes Management:  Are you having any financial strains with the device, your  supplies or your medication? No .  Does the patient want to be seen by Chronic Care Management for management of their diabetes?  No  Would the patient like to be referred to a Nutritionist or for Diabetic Management?  No   Diabetic Exams:  Diabetic Eye Exam: Completed 01/16/18.   Diabetic Foot Exam: Completed 05/17/13. Pt has been advised about the importance in completing this exam. Note made to have this completed at today's CPE.    Nutritional Status: BMI 25 -29 Overweight Nutritional Risks: None    How often do you need to have someone help you when you read instructions, pamphlets, or other written materials from your doctor or pharmacy?: 1 - Never  Interpreter Needed?: No  Information entered by :: [facility], LPN   Activities of Daily Living In your present state of health, do you have any difficulty performing the following activities: 07/17/2018  Hearing? N  Vision? N  Comment Wear eye glasses.   Difficulty concentrating or making decisions? N  Walking or climbing stairs? Y  Comment Due to hip stiffness and foot pain.   Dressing or bathing? N  Doing errands, shopping? N  Preparing Food and eating ? N  Using the Toilet? N  In the past six months, have you accidently leaked urine? N  Do you have problems with loss of bowel control? N  Managing your Medications? N  Managing your Finances? N  Housekeeping or managing your Housekeeping? N  Some recent data might be hidden     Immunizations and Health Maintenance Immunization History  Administered Date(s) Administered  . Influenza, High Dose Seasonal PF 04/19/2018  . Influenza,inj,Quad PF,6+ Mos 07/17/2015, 07/09/2016  . Pneumococcal Conjugate-13 11/16/2016  . Pneumococcal Polysaccharide-23 09/09/2011  . Tdap 07/10/2013   Health Maintenance Due  Topic Date Due  . Hepatitis C Screening  [birth year]  . FOOT EXAM  08/12/1961  . PNA vac Low Risk Adult (2 of 2 - PPSV23) 11/16/2017    Patient Care Team: [name], MD as PCP - General (Family Medicine) Jillyn Hidden D as Referring Physician (Chiropractic Medicine)  Indicate any recent Medical Services you may have received from other than [facility] providers in the past year (date may be approximate).     Assessment:   This is a routine wellness examination for Anna Sweeney.  Hearing/Vision screen Vision Screening Comments: Goes to [facility] once yearly for vision checks.   Dietary issues and exercise activities discussed: Current Exercise Habits: The patient does not participate in regular exercise at present, Exercise limited by: orthopedic condition(s)  Goals    . DIET -  INCREASE WATER INTAKE     Recommend increasing water intake to at least 3 glasses every day.       Depression Screen PHQ 2/9 Scores 07/17/2018 11/28/2017 11/16/2016 07/17/2015  PHQ - 2 Score 1 0 0 0  PHQ- 9 Score - 0 2 0    Fall Risk Fall Risk  07/17/2018 11/28/2017 11/16/2016  Falls in the past year? 0 No No    FALL RISK PREVENTION PERTAINING TO THE HOME:  Any stairs in or around the home WITH handrails? No  Home free of loose throw rugs in walkways, pet beds, electrical cords, etc? Yes  Adequate lighting in your home to reduce risk of falls? Yes   ASSISTIVE DEVICES UTILIZED TO PREVENT FALLS:  Life alert? No  Use of a cane, walker or w/c? No  Grab bars in the bathroom? No  Shower chair or bench in shower? No  Elevated toilet seat or a handicapped toilet? No    TIMED UP AND GO:  Was the test performed? No .    Cognitive Function: Declined today.         Screening Tests Health Maintenance  Topic Date Due  . Hepatitis C Screening  10-30-1950  . FOOT EXAM  08/12/1961  . PNA vac Low Risk Adult (2 of 2 - PPSV23) 11/16/2017  . HEMOGLOBIN A1C  08/31/2018  . OPHTHALMOLOGY EXAM  01/17/2019  . Fecal DNA (Cologuard)  02/02/2019  . MAMMOGRAM  04/06/2020  . DEXA SCAN  08/11/2022  . TETANUS/TDAP  07/11/2023  . INFLUENZA VACCINE  Completed    Qualifies for  Shingles Vaccine? Yes . Due for Shingrix. Education has been provided regarding the importance of this vaccine. Pt has been advised to call insurance company to determine out of pocket expense. Advised may also receive vaccine at local pharmacy or Health Dept. Verbalized acceptance and understanding.  Tdap: Up to date  Flu Vaccine: Up to date  Pneumococcal Vaccine: Due for Pneumococcal vaccine. Does the patient want to receive this vaccine today?  No . Education has been provided regarding the importance of this vaccine but still declined. Advised may receive this vaccine at local pharmacy or Health Dept. Aware to provide a copy of the vaccination record if obtained from local pharmacy or Health Dept. Verbalized acceptance and understanding.   Cancer Screenings:  Colorectal Screening: Cologuard completed 02/02/16. Repeat every 3 years.  Mammogram: Completed 04/06/18.   Bone Density: Completed 08/11/17. Results reflect OSTEOPENIA. Repeat every 5 years.   Lung Cancer Screening: (Low Dose CT Chest recommended if Age 53-80 years, 30 pack-year currently smoking OR have quit w/in 15years.) does not qualify.    Additional Screening:  Hepatitis C Screening: does qualify; however declines today.   Vision Screening: Recommended annual ophthalmology exams for early detection of glaucoma and other disorders of the eye.  Dental Screening: Recommended annual dental exams for proper oral hygiene  Community Resource Referral:  CRR required this visit?  No       Plan:  I have personally reviewed and addressed the Medicare Annual Wellness questionnaire and have noted the following in the patient's chart:  A. Medical and social history B. Use of alcohol, tobacco or illicit drugs  C. Current medications and supplements D. Functional ability and status E.  Nutritional status F.  Physical activity G. Advance directives H. List of other physicians I.  Hospitalizations, surgeries, and ER visits  in previous 12 months J.  Adrian such as hearing and vision if needed, cognitive  and depression L. Referrals and appointments - none  In addition, I have reviewed and discussed with patient certain preventive protocols, quality metrics, and best practice recommendations. A written personalized care plan for preventive services as well as general preventive health recommendations were provided to patient.  See attached scanned questionnaire for additional information.   Signed,  Fabio Neighbors, LPN Nurse Health Advisor   Nurse Recommendations: Pt needs a diabetic foot exam today. Pt declines the Pneumovax 23 vaccine and Hep C lab order today.

## 2018-07-17 NOTE — Patient Instructions (Addendum)
   The CDC recommends two doses of Shingrix (the shingles vaccine) separated by 2 to 6 months for adults age 66 years and older. I recommend checking with your pharmacy plan regarding coverage for this vaccine.     

## 2018-07-18 DIAGNOSIS — M722 Plantar fascial fibromatosis: Secondary | ICD-10-CM | POA: Diagnosis not present

## 2018-07-18 DIAGNOSIS — E7800 Pure hypercholesterolemia, unspecified: Secondary | ICD-10-CM | POA: Diagnosis not present

## 2018-07-18 DIAGNOSIS — Z1159 Encounter for screening for other viral diseases: Secondary | ICD-10-CM | POA: Diagnosis not present

## 2018-07-18 DIAGNOSIS — E119 Type 2 diabetes mellitus without complications: Secondary | ICD-10-CM | POA: Diagnosis not present

## 2018-07-19 LAB — LIPID PANEL
CHOLESTEROL TOTAL: 160 mg/dL (ref 100–199)
Chol/HDL Ratio: 4.6 ratio — ABNORMAL HIGH (ref 0.0–4.4)
HDL: 35 mg/dL — AB (ref >39)
LDL Calculated: 68 mg/dL (ref 0–99)
Triglycerides: 285 mg/dL — ABNORMAL HIGH (ref 0–149)
VLDL Cholesterol Cal: 57 mg/dL — ABNORMAL HIGH (ref 5–40)

## 2018-07-19 LAB — COMPREHENSIVE METABOLIC PANEL
A/G RATIO: 1.5 (ref 1.2–2.2)
ALBUMIN: 4.5 g/dL (ref 3.6–4.8)
ALK PHOS: 75 IU/L (ref 39–117)
ALT: 36 IU/L — AB (ref 0–32)
AST: 45 IU/L — ABNORMAL HIGH (ref 0–40)
BUN/Creatinine Ratio: 11 — ABNORMAL LOW (ref 12–28)
BUN: 19 mg/dL (ref 8–27)
Bilirubin Total: 0.4 mg/dL (ref 0.0–1.2)
CO2: 23 mmol/L (ref 20–29)
Calcium: 9.9 mg/dL (ref 8.7–10.3)
Chloride: 99 mmol/L (ref 96–106)
Creatinine, Ser: 1.68 mg/dL — ABNORMAL HIGH (ref 0.57–1.00)
GFR calc Af Amer: 36 mL/min/{1.73_m2} — ABNORMAL LOW (ref >59)
GFR calc non Af Amer: 31 mL/min/{1.73_m2} — ABNORMAL LOW (ref >59)
GLOBULIN, TOTAL: 3 g/dL (ref 1.5–4.5)
Glucose: 133 mg/dL — ABNORMAL HIGH (ref 65–99)
Potassium: 4.8 mmol/L (ref 3.5–5.2)
Sodium: 138 mmol/L (ref 134–144)
Total Protein: 7.5 g/dL (ref 6.0–8.5)

## 2018-07-19 LAB — HEPATITIS C ANTIBODY: Hep C Virus Ab: <0.1 s/co ratio (ref 0.0–0.9)

## 2018-07-19 LAB — HEMOGLOBIN A1C
ESTIMATED AVERAGE GLUCOSE: 180 mg/dL
HEMOGLOBIN A1C: 7.9 % — AB (ref 4.8–5.6)

## 2018-07-24 ENCOUNTER — Telehealth: Payer: Self-pay

## 2018-07-24 NOTE — Telephone Encounter (Signed)
LMTCB 07/24/2018  Thanks,   -Katharin Schneider  

## 2018-07-24 NOTE — Telephone Encounter (Signed)
-----   Message from Malva Limesonald E Fisher, MD sent at 07/24/2018 12:47 PM EST ----- a1c is up slightly to 7.9%. cholesterol is well controlled at 160. Rest of labs normal. Continue current medications.  Try to get more strict with diet. Schedule follow up diabetes in 4-5 months.

## 2018-07-25 DIAGNOSIS — M722 Plantar fascial fibromatosis: Secondary | ICD-10-CM | POA: Diagnosis not present

## 2018-07-25 NOTE — Telephone Encounter (Signed)
Patient advised. Follow up appointment scheduled 11/27/2018 at 3:20pm

## 2018-07-25 NOTE — Telephone Encounter (Signed)
Pt returned missed call. Please call pt back to give lab results.  Thanks, [employer]

## 2018-07-26 ENCOUNTER — Other Ambulatory Visit: Payer: Self-pay | Admitting: Family Medicine

## 2018-10-14 ENCOUNTER — Other Ambulatory Visit: Payer: Self-pay | Admitting: Family Medicine

## 2018-10-14 DIAGNOSIS — E7800 Pure hypercholesterolemia, unspecified: Secondary | ICD-10-CM

## 2018-10-26 ENCOUNTER — Other Ambulatory Visit: Payer: Self-pay | Admitting: Family Medicine

## 2018-11-27 ENCOUNTER — Ambulatory Visit: Payer: Self-pay | Admitting: Family Medicine

## 2018-12-29 ENCOUNTER — Other Ambulatory Visit: Payer: Self-pay | Admitting: Family Medicine

## 2018-12-29 ENCOUNTER — Telehealth: Payer: Self-pay

## 2018-12-29 MED ORDER — ACCU-CHEK SOFTCLIX LANCETS MISC: 4 refills | Status: DC

## 2018-12-29 MED ORDER — GLUCOSE BLOOD VI STRP: ORAL_STRIP | 4 refills | Status: DC

## 2018-12-29 MED ORDER — ACCU-CHEK AVIVA PLUS W/DEVICE KIT: PACK | 0 refills | Status: DC

## 2018-12-29 NOTE — Telephone Encounter (Signed)
Patient called saying that she was recently started on Trulicity and she has tolerated the medication well. Unfortunately, she reports that the medication went from about $40 to $110. She reports that she can not afford to keep taking the medication. She has 1 more injection to last until she sees you on 01/16/19. She is wanting to know does she wait until she comes in to discuss alternative treatments, or do you want to go ahead and discontinue the medication and start something new to discuss at her appt? Please advise. Thanks!

## 2018-12-29 NOTE — Telephone Encounter (Signed)
We can discuss it at her follow up visit. But she should check her insurance formulary to see if anything is available at a lower tier. Other options would be Bydureon, Ozempic, Victoza.

## 2018-12-29 NOTE — Progress Notes (Signed)
Fax from [facility] 12/28/2018 stating patient requesting diabetic supplies sent to mail order

## 2019-01-02 ENCOUNTER — Ambulatory Visit: Payer: Self-pay | Admitting: Family Medicine

## 2019-01-02 NOTE — Telephone Encounter (Signed)
Patient advised and agrees to contact insurance before appt.

## 2019-01-03 ENCOUNTER — Other Ambulatory Visit: Payer: Self-pay | Admitting: Family Medicine

## 2019-01-03 DIAGNOSIS — E119 Type 2 diabetes mellitus without complications: Secondary | ICD-10-CM

## 2019-01-16 ENCOUNTER — Encounter: Payer: Self-pay | Admitting: Family Medicine

## 2019-01-16 ENCOUNTER — Ambulatory Visit (INDEPENDENT_AMBULATORY_CARE_PROVIDER_SITE_OTHER): Payer: Medicare HMO | Admitting: Family Medicine

## 2019-01-16 ENCOUNTER — Other Ambulatory Visit: Payer: Self-pay

## 2019-01-16 VITALS — BP 150/82 | HR 79 | Temp 98.6°F | Resp 16 | Wt 170.0 lb

## 2019-01-16 DIAGNOSIS — L301 Dyshidrosis [pompholyx]: Secondary | ICD-10-CM

## 2019-01-16 DIAGNOSIS — E119 Type 2 diabetes mellitus without complications: Secondary | ICD-10-CM | POA: Diagnosis not present

## 2019-01-16 LAB — POCT GLYCOSYLATED HEMOGLOBIN (HGB A1C)
Est. average glucose Bld gHb Est-mCnc: 171
Hemoglobin A1C: 7.6 % — AB (ref 4.0–5.6)

## 2019-01-16 MED ORDER — BETAMETHASONE DIPROPIONATE 0.05 % EX CREA: TOPICAL_CREAM | Freq: Two times a day (BID) | CUTANEOUS | 1 refills | Status: AC | PRN

## 2019-01-16 NOTE — Patient Instructions (Addendum)
.   Please review the attached list of medications and notify my office if there are any errors.   . Please bring all of your medications to every appointment so we can make sure that our medication list is the same as yours.    The CDC recommends two doses of Shingrix (the shingles vaccine) separated by 2 to 6 months for adults age 67 years and older. I recommend checking with your insurance plan regarding coverage for this vaccine.   

## 2019-01-16 NOTE — Progress Notes (Signed)
Patient: Anna Sweeney Female    DOB: 03/18/1951   68 y.o.   MRN: [id number] Visit Date: 01/16/2019  Today's Provider: Lelon Huh, MD   Chief Complaint  Patient presents with  . Diabetes  . Hypertension   Subjective:     HPI  Diabetes Mellitus Type II, Follow-up:   Lab Results  Component Value Date   HGBA1C 7.9 (H) 07/18/2018   HGBA1C 7.7 (A) 02/28/2018   HGBA1C 8.1 11/28/2017    Last seen for diabetes 6 months ago.  Management since then includes no changes. She reports fair compliance with treatment. She is not having side effects.  Current symptoms include none and have been stable. Home blood sugar records: blood sugars are rarely checked  Episodes of hypoglycemia? no   Current Insulin Regimen:  Most Recent Eye Exam: 1 year ago Weight trend: decreasing steadily Prior visit with dietician: No Current exercise: none Current diet habits: well balanced  Pertinent Labs:    Component Value Date/Time   CHOL 160 07/18/2018 0857   TRIG 285 (H) 07/18/2018 0857   HDL 35 (L) 07/18/2018 0857   LDLCALC 68 07/18/2018 0857   CREATININE 1.68 (H) 07/18/2018 0857    Wt Readings from Last 3 Encounters:  01/16/19 170 lb (77.1 kg)  07/17/18 178 lb 3.2 oz (80.8 kg)  02/28/18 174 lb (78.9 kg)    ------------------------------------------------------------------------  Hypertension, follow-up:  BP Readings from Last 3 Encounters:  01/16/19 (!) 150/82  07/17/18 (!) 124/58  02/28/18 120/60    She was last seen for hypertension 6 months ago.  BP at that visit was 124/58. Management since that visit includes no changes. She reports fair compliance with treatment. Some days she may forget to take medication.  She is not having side effects.  She is not exercising. She is not adherent to low salt diet.   Outside blood pressures are not checked. She is experiencing none.  Patient denies chest pain, chest pressure/discomfort, claudication, dyspnea, exertional  chest pressure/discomfort, fatigue, irregular heart beat, lower extremity edema, near-syncope, orthopnea, palpitations, paroxysmal nocturnal dyspnea, syncope and tachypnea.   Cardiovascular risk factors include advanced age (older than 78 for men, 72 for women), diabetes mellitus and hypertension.  Use of agents associated with hypertension: NSAIDS.     Weight trend: decreasing steadily Wt Readings from Last 3 Encounters:  01/16/19 170 lb (77.1 kg)  07/17/18 178 lb 3.2 oz (80.8 kg)  02/28/18 174 lb (78.9 kg)    Current diet: well balanced  ------------------------------------------------------------------------  Allergies  Allergen Reactions  . Pioglitazone     Dizziness     Current Outpatient Medications:  .  Accu-Chek Softclix Lancets lancets, Use to check blood sugar daily for type 2 diabetes. E11.9, Disp: 100 each, Rfl: 4 .  aspirin EC 81 MG tablet, Take 81 mg by mouth daily., Disp: , Rfl:  .  betamethasone dipropionate (DIPROLENE) 0.05 % cream, Apply topically 2 (two) times daily as needed., Disp: 30 g, Rfl: 0 .  Blood Glucose Monitoring Suppl (ACCU-CHEK AVIVA PLUS) w/Device KIT, Use to check blood sugar daily for type 2 diabetes. E11.9, Disp: 1 kit, Rfl: 0 .  Dulaglutide 1.5 MG/0.5ML SOPN, INJECT 1.5 MG INTO THE SKIN ONCE A WEEK., Disp: 4 pen, Rfl: 12 .  glipiZIDE (GLUCOTROL XL) 5 MG 24 hr tablet, TAKE 1 TABLET BY MOUTH EVERY DAY, Disp: 90 tablet, Rfl: 1 .  glucose blood (ACCU-CHEK AVIVA PLUS) test strip, Use to check blood sugar daily for  type 2 diabetes. E11.9, Disp: 100 each, Rfl: 4 .  lovastatin (MEVACOR) 20 MG tablet, TAKE 1 TABLET BY MOUTH (20 MG TOTAL) AT BEDTIME, Disp: 90 tablet, Rfl: 4 .  metFORMIN (GLUCOPHAGE) 1000 MG tablet, Take 1 tablet (1,000 mg total) by mouth 2 (two) times daily., Disp: 60 tablet, Rfl: 11 .  trandolapril (MAVIK) 4 MG tablet, TAKE 1 TABLET BY MOUTH (4 MG TOTAL) DAILY, Disp: 90 tablet, Rfl: 4 .  verapamil (VERELAN PM) 240 MG 24 hr capsule, TAKE 1  CAPSULE (240 MG TOTAL) BY MOUTH AT BEDTIME., Disp: 30 capsule, Rfl: 11  Review of Systems  Constitutional: Negative for appetite change, chills, fatigue and fever.  Respiratory: Negative for chest tightness and shortness of breath.   Cardiovascular: Negative for chest pain and palpitations.  Gastrointestinal: Negative for abdominal pain, nausea and vomiting.  Neurological: Negative for dizziness and weakness.    Social History   Tobacco Use  . Smoking status: Former Smoker    Packs/day: 1.00    Years: 4.00    Pack years: 4.00  . Smokeless tobacco: Never Used  . Tobacco comment: quit in 1991  Substance Use Topics  . Alcohol use: No      Objective:   BP (!) 150/82 (BP Location: Right Arm, Cuff Size: Large)   Pulse 79   Temp 98.6 F (37 C) (Oral)   Resp 16   Wt 170 lb (77.1 kg)   SpO2 98% Comment: room air  BMI 27.44 kg/m  Vitals:   01/16/19 1005 01/16/19 1011  BP: (!) 148/82 (!) 150/82  Pulse: 79   Resp: 16   Temp: 98.6 F (37 C)   TempSrc: Oral   SpO2: 98%   Weight: 170 lb (77.1 kg)      Physical Exam   General Appearance:    Alert, cooperative, no distress  Eyes:    PERRL, conjunctiva/corneas clear, EOM's intact       Lungs:     Clear to auscultation bilaterally, respirations unlabored  Heart:    Regular rate and rhythm  Neurologic:   Awake, alert, oriented x 3. No apparent focal neurological           defect.       Results for orders placed or performed in visit on 01/16/19  POCT HgB A1C  Result Value Ref Range   Hemoglobin A1C 7.6 (A) 4.0 - 5.6 %   HbA1c POC (<> result, manual entry)     HbA1c, POC (prediabetic range)     HbA1c, POC (controlled diabetic range)     Est. average glucose Bld gHb Est-mCnc 171         Assessment & Plan    1. Type 2 diabetes mellitus without complication, without long-term current use of insulin (HCC) Stable. Continue current medications.   - POCT HgB A1C  2. Dyshidrotic eczema refill- betamethasone dipropionate  (DIPROLENE) 0.05 % cream; Apply topically 2 (two) times daily as needed.  Dispense: 30 g; Refill: 1    The entirety of the information documented in the History of Present Illness, Review of Systems and Physical Exam were personally obtained by me. Portions of this information were initially documented by Meyer Cory, CMA and reviewed by me for thoroughness and accuracy.    Lelon Huh, MD  [facility]

## 2019-02-26 ENCOUNTER — Other Ambulatory Visit: Payer: Self-pay | Admitting: Family Medicine

## 2019-02-26 NOTE — Telephone Encounter (Signed)
Yes, she can have 2 pens

## 2019-02-26 NOTE — Telephone Encounter (Signed)
Is it okay to give if available?  Thanks,   -Laura  

## 2019-02-26 NOTE — Telephone Encounter (Signed)
Pt asking if we have any Trulicity samples she can have?  Please call pt back to let her know.  Thanks, [employer]

## 2019-02-26 NOTE — Telephone Encounter (Signed)
Samples are in the refrigerator.  Left message advising pt.   Thanks,   -Mickel Baas

## 2019-03-27 ENCOUNTER — Telehealth: Payer: Self-pay | Admitting: Family Medicine

## 2019-03-27 NOTE — Telephone Encounter (Signed)
Pt called asking if we have samples for Trulicity.  CB#  [phone]  [facility]

## 2019-03-27 NOTE — Telephone Encounter (Signed)
Can have 2 boxes of the 1.5mg  pens

## 2019-03-27 NOTE — Telephone Encounter (Signed)
Samples in the refrigerator; pt advised.   Thanks,   -Mickel Baas

## 2019-04-02 DIAGNOSIS — H524 Presbyopia: Secondary | ICD-10-CM | POA: Diagnosis not present

## 2019-04-02 DIAGNOSIS — Z0100 Encounter for examination of eyes and vision without abnormal findings: Secondary | ICD-10-CM | POA: Diagnosis not present

## 2019-04-02 DIAGNOSIS — E119 Type 2 diabetes mellitus without complications: Secondary | ICD-10-CM | POA: Diagnosis not present

## 2019-04-02 DIAGNOSIS — Z135 Encounter for screening for eye and ear disorders: Secondary | ICD-10-CM | POA: Diagnosis not present

## 2019-04-14 ENCOUNTER — Other Ambulatory Visit: Payer: Self-pay | Admitting: Family Medicine

## 2019-04-14 DIAGNOSIS — E119 Type 2 diabetes mellitus without complications: Secondary | ICD-10-CM

## 2019-06-04 ENCOUNTER — Telehealth: Payer: Self-pay

## 2019-06-04 NOTE — Telephone Encounter (Signed)
No Trulicity samples available. Patient advised.

## 2019-06-04 NOTE — Telephone Encounter (Signed)
Pt is requesting to pick up samples of Dulaglutide (TRULICITY) 1.5 UC/7.6RW SOPN if we have any. Please advise. Thanks TNP

## 2019-06-15 ENCOUNTER — Encounter: Payer: Self-pay | Admitting: Family Medicine

## 2019-06-15 ENCOUNTER — Ambulatory Visit (INDEPENDENT_AMBULATORY_CARE_PROVIDER_SITE_OTHER): Payer: Medicare HMO | Admitting: Family Medicine

## 2019-06-15 ENCOUNTER — Other Ambulatory Visit: Payer: Self-pay

## 2019-06-15 VITALS — BP 132/64 | HR 87 | Temp 96.8°F | Resp 16 | Wt 169.0 lb

## 2019-06-15 DIAGNOSIS — I10 Essential (primary) hypertension: Secondary | ICD-10-CM | POA: Diagnosis not present

## 2019-06-15 DIAGNOSIS — E7800 Pure hypercholesterolemia, unspecified: Secondary | ICD-10-CM | POA: Diagnosis not present

## 2019-06-15 DIAGNOSIS — E119 Type 2 diabetes mellitus without complications: Secondary | ICD-10-CM

## 2019-06-15 DIAGNOSIS — I471 Supraventricular tachycardia, unspecified: Secondary | ICD-10-CM

## 2019-06-15 DIAGNOSIS — Z1211 Encounter for screening for malignant neoplasm of colon: Secondary | ICD-10-CM | POA: Diagnosis not present

## 2019-06-15 DIAGNOSIS — Z23 Encounter for immunization: Secondary | ICD-10-CM

## 2019-06-15 DIAGNOSIS — N1832 Chronic kidney disease, stage 3b: Secondary | ICD-10-CM

## 2019-06-15 NOTE — Progress Notes (Signed)
     Patient: Anna Sweeney Female    DOB: 07/13/1951   68 y.o.   MRN: [id number] Visit Date: 06/15/2019  Today's Provider: Donald Fisher, MD   Chief Complaint  Patient presents with  . Hypertension   Subjective:     HPI  Hypertension, follow-up:     BP Readings from Last 3 Encounters:  01/16/19 (!) 150/82  07/17/18 (!) 124/58  02/28/18 120/60    She was last seen for hypertension 5 months ago.  BP at that visit was 150/82. Management since that visit includes no changes. She reports good compliance with treatment. She is not having side effects.  She is not exercising. She is not adherent to low salt diet.   Outside blood pressures are being checked and have been elevated the past week 154/74. She is experiencing none.  Patient denies chest pain, chest pressure/discomfort, claudication, dyspnea, exertional chest pressure/discomfort, fatigue, irregular heart beat, lower extremity edema, near-syncope, orthopnea, palpitations, paroxysmal nocturnal dyspnea, syncope and tachypnea.   Cardiovascular risk factors include advanced age (older than 55 for men, 65 for women), diabetes mellitus and hypertension.  Use of agents associated with hypertension: NSAIDS.                Weight trend:     Wt Readings from Last 3 Encounters:  01/16/19 170 lb (77.1 kg)  07/17/18 178 lb 3.2 oz (80.8 kg)  02/28/18 174 lb (78.9 kg)    Current diet: well balanced  ------------------------------------------------------------------------  She is also due for follow up diabetes and lipids. Lab Results  Component Value Date   HGBA1C 7.6 (A) 01/16/2019   HGBA1C 7.9 (H) 07/18/2018   HGBA1C 7.7 (A) 02/28/2018   Lab Results  Component Value Date   CHOL 160 07/18/2018   HDL 35 (L) 07/18/2018   LDLCALC 68 07/18/2018   TRIG 285 (H) 07/18/2018   CHOLHDL 4.6 (H) 07/18/2018   Is doing well on current medications, no adverse effects. No hypoglyemice. Home sugars mostly in the 150s to  160s.   Allergies  Allergen Reactions  . Pioglitazone     Dizziness     Current Outpatient Medications:  .  Accu-Chek Softclix Lancets lancets, Use to check blood sugar daily for type 2 diabetes. E11.9, Disp: 100 each, Rfl: 4 .  aspirin EC 81 MG tablet, Take 81 mg by mouth daily., Disp: , Rfl:  .  betamethasone dipropionate (DIPROLENE) 0.05 % cream, Apply topically 2 (two) times daily as needed., Disp: 30 g, Rfl: 1 .  Blood Glucose Monitoring Suppl (ACCU-CHEK AVIVA PLUS) w/Device KIT, Use to check blood sugar daily for type 2 diabetes. E11.9, Disp: 1 kit, Rfl: 0 .  Dulaglutide 1.5 MG/0.5ML SOPN, INJECT 1.5 MG INTO THE SKIN ONCE A WEEK., Disp: 4 pen, Rfl: 12 .  glipiZIDE (GLUCOTROL XL) 5 MG 24 hr tablet, TAKE 1 TABLET BY MOUTH EVERY DAY, Disp: 90 tablet, Rfl: 1 .  glucose blood (ACCU-CHEK AVIVA PLUS) test strip, Use to check blood sugar daily for type 2 diabetes. E11.9, Disp: 100 each, Rfl: 4 .  lovastatin (MEVACOR) 20 MG tablet, TAKE 1 TABLET BY MOUTH (20 MG TOTAL) AT BEDTIME, Disp: 90 tablet, Rfl: 4 .  metFORMIN (GLUCOPHAGE) 1000 MG tablet, TAKE 1 TABLET BY MOUTH TWICE A DAY, Disp: 180 tablet, Rfl: 3 .  trandolapril (MAVIK) 4 MG tablet, TAKE 1 TABLET BY MOUTH (4 MG TOTAL) DAILY, Disp: 90 tablet, Rfl: 4 .  verapamil (VERELAN PM) 240 MG 24 hr capsule, TAKE   1 CAPSULE (240 MG TOTAL) BY MOUTH AT BEDTIME., Disp: 30 capsule, Rfl: 11  Review of Systems  Constitutional: Negative for appetite change, chills, fatigue and fever.  Respiratory: Negative for chest tightness and shortness of breath.   Cardiovascular: Negative for chest pain and palpitations.  Gastrointestinal: Negative for abdominal pain, nausea and vomiting.  Neurological: Negative for dizziness and weakness.    Social History   Tobacco Use  . Smoking status: Former Smoker    Packs/day: 1.00    Years: 4.00    Pack years: 4.00  . Smokeless tobacco: Never Used  . Tobacco comment: quit in 1991  Substance Use Topics  . Alcohol  use: No      Objective:   BP 132/64 (BP Location: Right Arm, Cuff Size: Large)   Pulse 87   Temp (!) 96.8 F (36 C) (Temporal)   Resp 16   Wt 169 lb (76.7 kg)   SpO2 98% Comment: room air  BMI 27.28 kg/m  Vitals:   06/15/19 1046 06/15/19 1053  BP: 140/70 132/64  Pulse: 87   Resp: 16   Temp: (!) 96.8 F (36 C)   TempSrc: Temporal   SpO2: 98%   Weight: 169 lb (76.7 kg)   Body mass index is 27.28 kg/m.   Physical Exam   General Appearance:    Well developed, well nourished female in no acute distress  Eyes:    PERRL, conjunctiva/corneas clear, EOM's intact       Lungs:     Clear to auscultation bilaterally, respirations unlabored  Heart:    Normal heart rate. Normal rhythm. No murmurs, rubs, or gallops.   MS:   All extremities are intact.   Neurologic:   Awake, alert, oriented x 3. No apparent focal neurological           defect.          Assessment & Plan    1. Essential hypertension SBP running consistently high outside of the office. Consider adding low dose amlodipine after reviewing labs.   2. Type 2 diabetes mellitus without complication, without long-term current use of insulin ([place]) Doing well on current medications.  - Lipid panel (fasting) - HgB A1c  3. SVT (supraventricular tachycardia) ([facility]) Completely controlled on current dose of verapamil   4. Chronic kidney disease, stage 3b  - CBC - Vitamin D (25 hydroxy)  5. High cholesterol She is tolerating lovastatin well with no adverse effects.   - Comprehensive metabolic panel - Lipid panel (fasting) - CBC  6. Need for influenza vaccination   - Flu Vaccine QUAD High Dose(Fluad)     Lelon Huh, MD  [facility]

## 2019-06-15 NOTE — Patient Instructions (Signed)
.   Please review the attached list of medications and notify my office if there are any errors.   . Please bring all of your medications to every appointment so we can make sure that our medication list is the same as yours.   . It is especially important to get the annual flu vaccine this year. If you haven't had it already, please go to your pharmacy or call the office as soon as possible to schedule you flu shot.  

## 2019-06-16 LAB — LIPID PANEL
Chol/HDL Ratio: 4.9 ratio — ABNORMAL HIGH (ref 0.0–4.4)
Cholesterol, Total: 176 mg/dL (ref 100–199)
HDL: 36 mg/dL — ABNORMAL LOW (ref >39)
LDL Chol Calc (NIH): 99 mg/dL (ref 0–99)
Triglycerides: 238 mg/dL — ABNORMAL HIGH (ref 0–149)
VLDL Cholesterol Cal: 41 mg/dL — ABNORMAL HIGH (ref 5–40)

## 2019-06-16 LAB — COMPREHENSIVE METABOLIC PANEL
ALT: 52 IU/L — ABNORMAL HIGH (ref 0–32)
AST: 96 IU/L — ABNORMAL HIGH (ref 0–40)
Albumin/Globulin Ratio: 1.6 (ref 1.2–2.2)
Albumin: 4.4 g/dL (ref 3.8–4.8)
Alkaline Phosphatase: 75 IU/L (ref 39–117)
BUN/Creatinine Ratio: 14 (ref 12–28)
BUN: 26 mg/dL (ref 8–27)
Bilirubin Total: 0.3 mg/dL (ref 0.0–1.2)
CO2: 19 mmol/L — ABNORMAL LOW (ref 20–29)
Calcium: 10.5 mg/dL — ABNORMAL HIGH (ref 8.7–10.3)
Chloride: 100 mmol/L (ref 96–106)
Creatinine, Ser: 1.82 mg/dL — ABNORMAL HIGH (ref 0.57–1.00)
GFR calc Af Amer: 33 mL/min/{1.73_m2} — ABNORMAL LOW (ref >59)
GFR calc non Af Amer: 28 mL/min/{1.73_m2} — ABNORMAL LOW (ref >59)
Globulin, Total: 2.8 g/dL (ref 1.5–4.5)
Glucose: 195 mg/dL — ABNORMAL HIGH (ref 65–99)
Potassium: 4.8 mmol/L (ref 3.5–5.2)
Sodium: 137 mmol/L (ref 134–144)
Total Protein: 7.2 g/dL (ref 6.0–8.5)

## 2019-06-16 LAB — HEMOGLOBIN A1C
Est. average glucose Bld gHb Est-mCnc: 171 mg/dL
Hgb A1c MFr Bld: 7.6 % — ABNORMAL HIGH (ref 4.8–5.6)

## 2019-06-16 LAB — CBC
Hematocrit: 39.8 % (ref 34.0–46.6)
Hemoglobin: 13 g/dL (ref 11.1–15.9)
MCH: 30.2 pg (ref 26.6–33.0)
MCHC: 32.7 g/dL (ref 31.5–35.7)
MCV: 92 fL (ref 79–97)
Platelets: 211 10*3/uL (ref 150–450)
RBC: 4.31 x10E6/uL (ref 3.77–5.28)
RDW: 13 % (ref 11.7–15.4)
WBC: 6.5 10*3/uL (ref 3.4–10.8)

## 2019-06-16 LAB — VITAMIN D 25 HYDROXY (VIT D DEFICIENCY, FRACTURES): Vit D, 25-Hydroxy: 19.5 ng/mL — ABNORMAL LOW (ref 30.0–100.0)

## 2019-06-18 ENCOUNTER — Telehealth: Payer: Self-pay

## 2019-06-18 DIAGNOSIS — R7989 Other specified abnormal findings of blood chemistry: Secondary | ICD-10-CM

## 2019-06-18 MED ORDER — AMLODIPINE BESYLATE 5 MG PO TABS: 5.0000 mg | ORAL_TABLET | Freq: Every day | ORAL | 0 refills | Status: DC

## 2019-06-18 NOTE — Telephone Encounter (Signed)
Patient advised. RX sent to [facility]. RUQ U/S ordered. Patient has a appointment already scheduled for 12/14

## 2019-06-18 NOTE — Telephone Encounter (Signed)
-----  Message from [name], MD sent at 06/18/2019  8:12 AM EST ----- ALSO.  I forgot to remind patient that she is due for Cologuard. Have sent order and she should be getting stool collection kit delivered in the next week or to

## 2019-06-27 ENCOUNTER — Ambulatory Visit: Payer: Medicare HMO

## 2019-07-03 ENCOUNTER — Other Ambulatory Visit: Payer: Self-pay

## 2019-07-03 ENCOUNTER — Ambulatory Visit
Admission: RE | Admit: 2019-07-03 | Discharge: 2019-07-03 | Disposition: A | Discharge status: Home / Self Care | Payer: Medicare HMO | Source: Ambulatory Visit | Attending: Family Medicine | Admitting: Family Medicine

## 2019-07-03 DIAGNOSIS — R7989 Other specified abnormal findings of blood chemistry: Secondary | ICD-10-CM

## 2019-07-03 IMAGING — US US ABDOMEN LIMITED
1 series · 14 of 25 positions shown · non-contrast
Comparison: None.

CLINICAL DATA: Elevated liver function

EXAM:
ULTRASOUND ABDOMEN LIMITED RIGHT UPPER QUADRANT

[Series 2: us abdomen limited · 0.25mm/px · 14 of 59 slices shown]
[im 1/59]
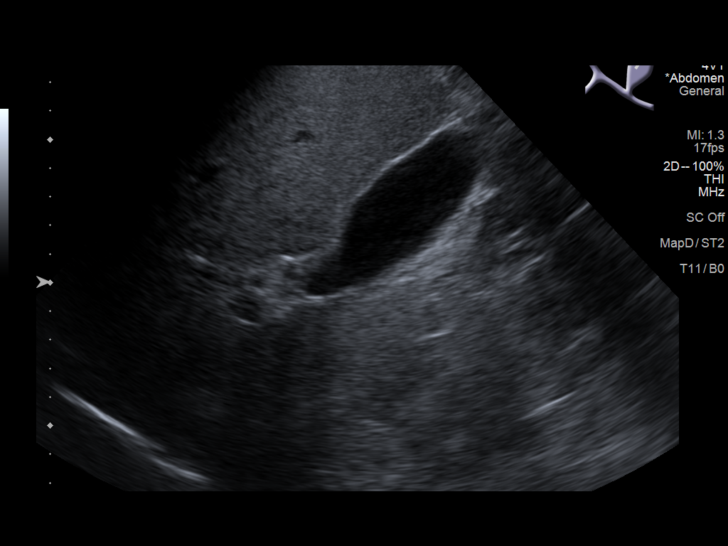
[im 5/59]
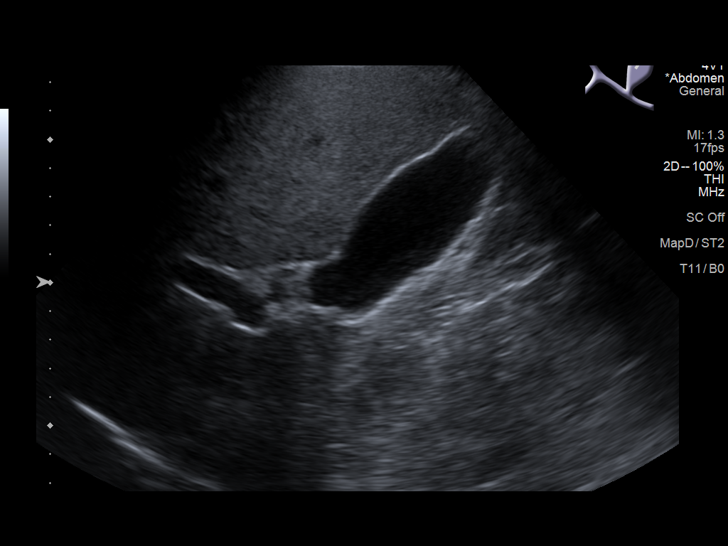
[im 10/59]
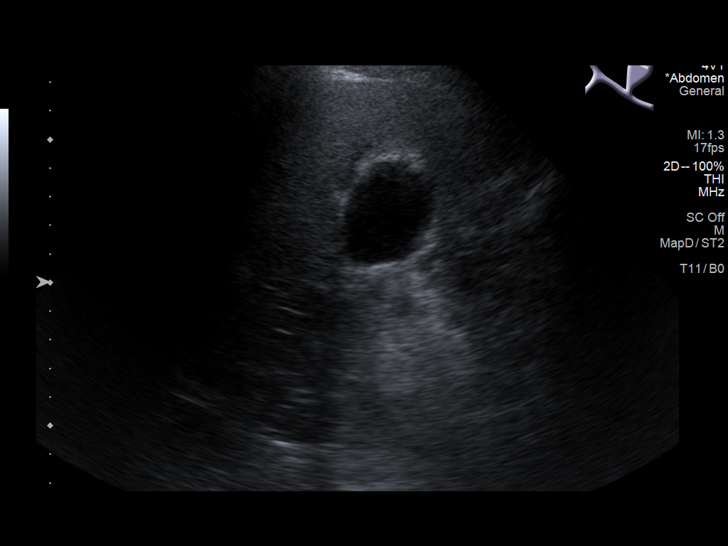
[im 15/59]
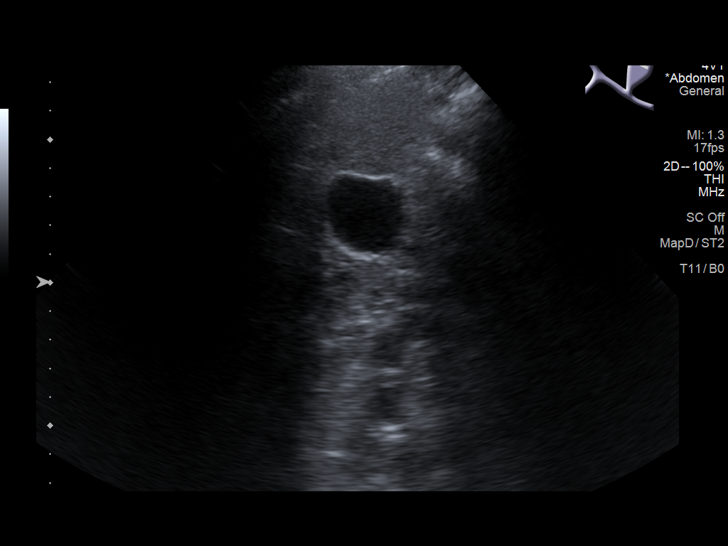
[im 20/59]
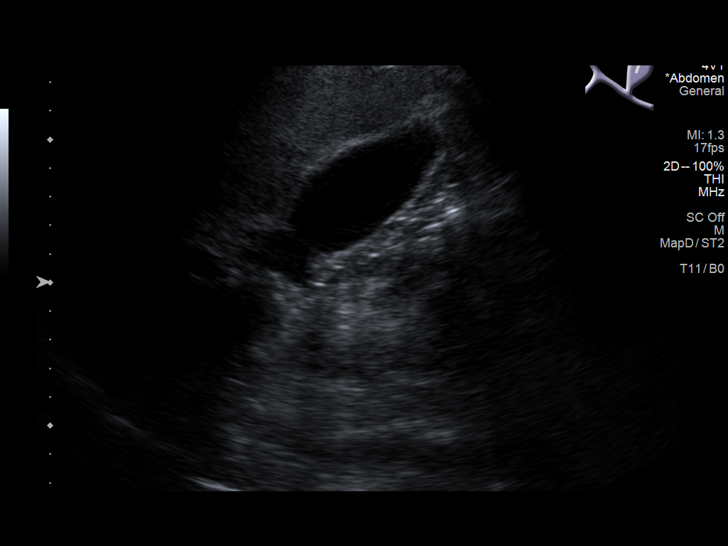
[im 22/59]
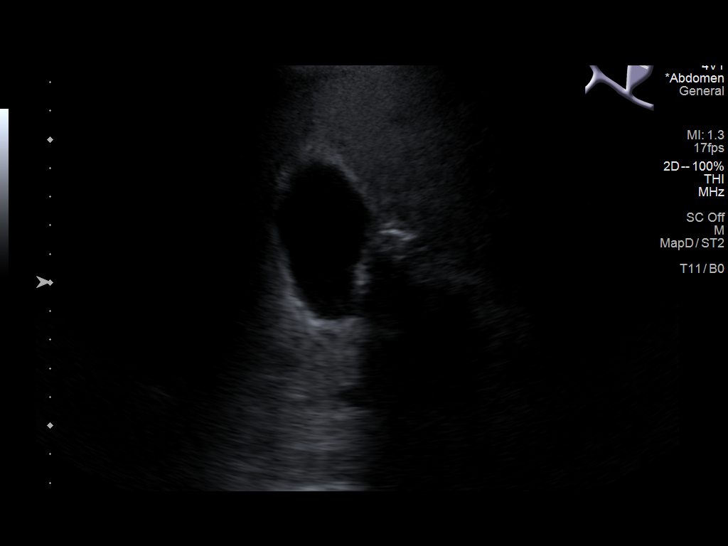
[im 27/59]
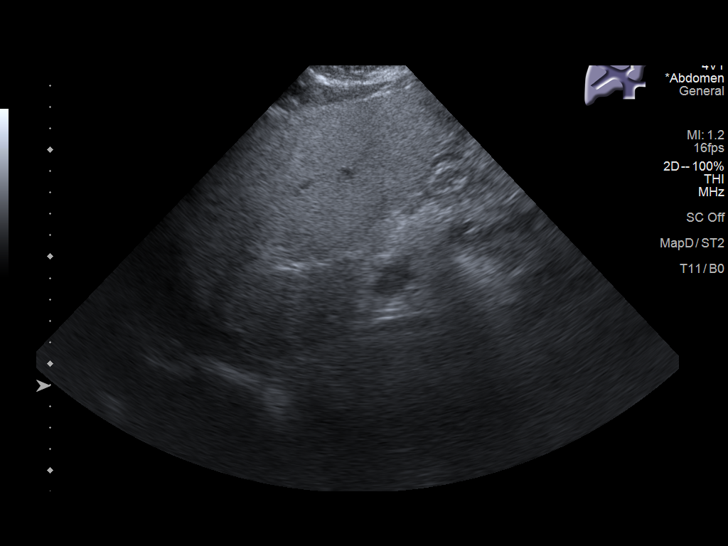
[im 32/59]
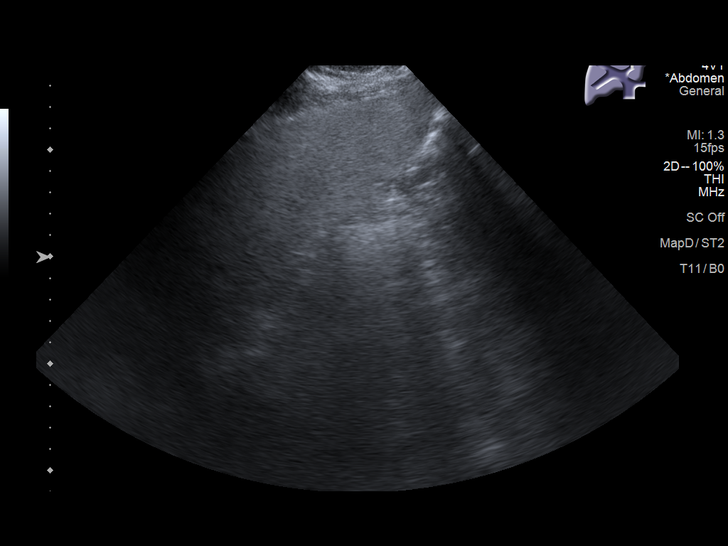
[im 37/59]
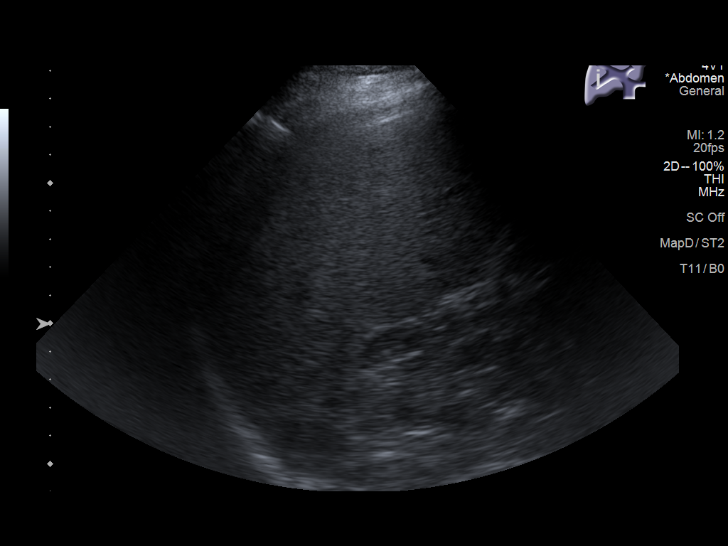
[im 39/59]
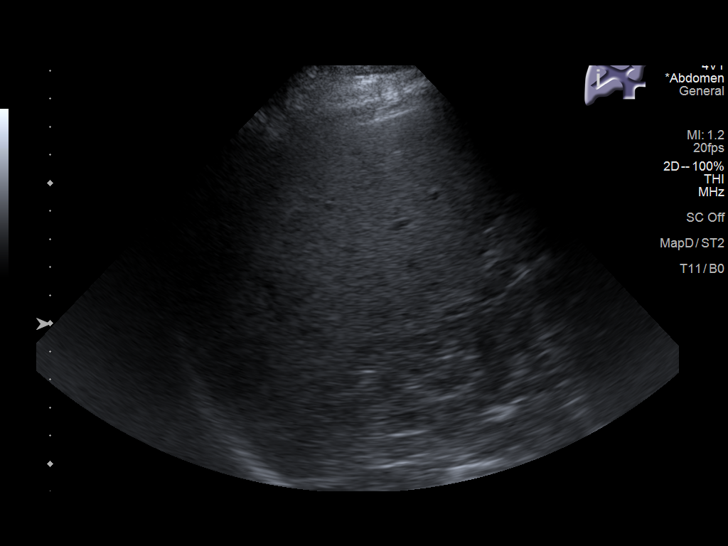
[im 44/59]
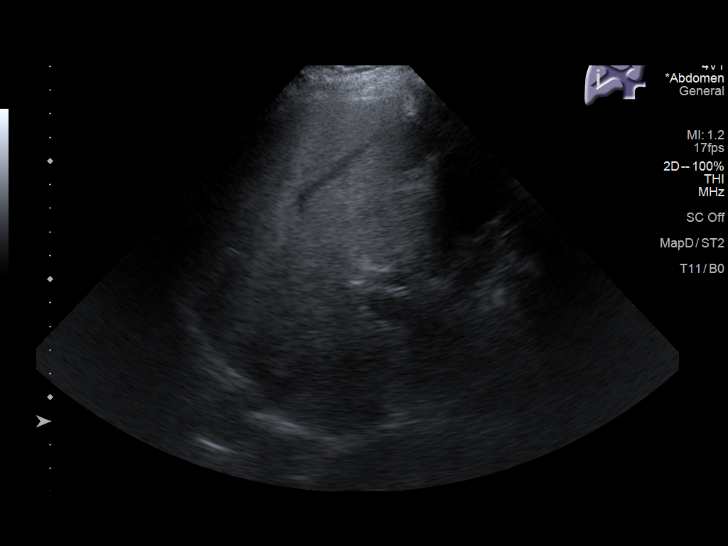
[im 49/59]
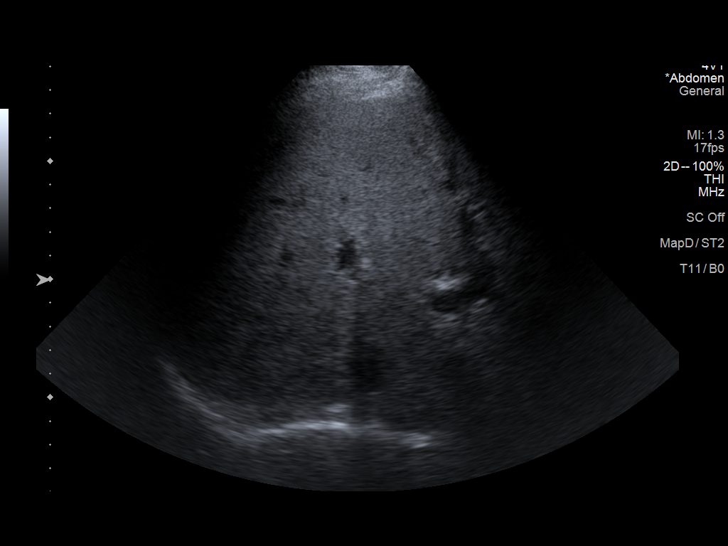
[im 54/59]
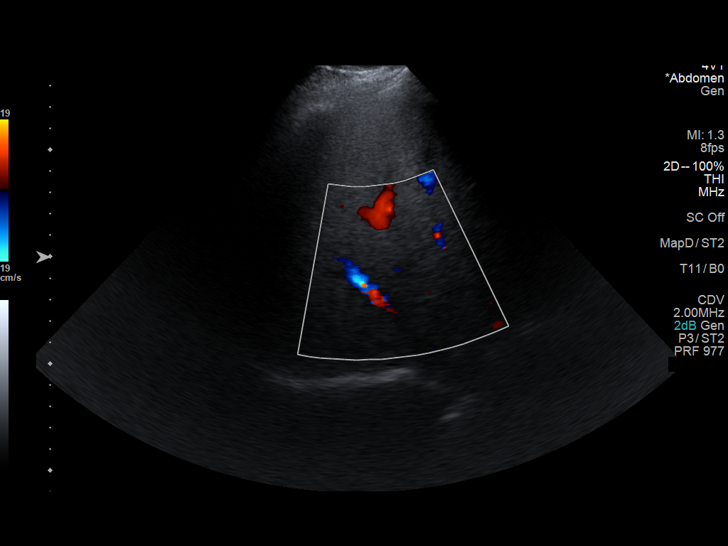
[im 59/59]
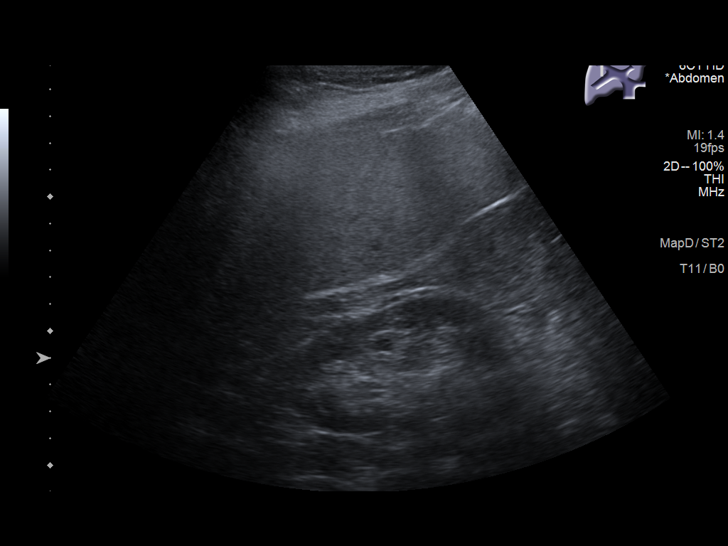

[14 of 25 positions shown; findings below may reference images not displayed]

FINDINGS: Gallbladder:

No gallstones or wall thickening visualized question of dependent
sludge. No sonographic Murphy sign noted by sonographer.

Common bile duct:

Diameter: 6 mm

Liver:

Increased echogenicity within the liver with lobulated contour.
Mildly heterogeneous echotexture. No focal lesion. Portal vein is
patent on color Doppler imaging with normal direction of blood flow
towards the liver.

Other: Renal cortical thinning. Signs of renal sinus lipomatosis
incidentally noted. No signs of ascites.
IMPRESSION: 1. No signs of acute cholecystitis.
2. Common bile duct at upper limits of normal. Significance
uncertain. Correlation with pattern laboratory abnormality to
determine whether MRCP may be helpful. Given hepatic findings below
further assessment of the liver with cross-sectional imaging such as
CT or MRI could potentially be of benefit.
3. Increased echogenicity of hepatic parenchyma with lobulated
contours raising the question of liver disease and perhaps even
cirrhosis/fibrosis.

## 2019-07-04 ENCOUNTER — Ambulatory Visit
Admission: RE | Admit: 2019-07-04 | Discharge: 2019-07-04 | Disposition: A | Discharge status: Home / Self Care | Payer: Medicare HMO | Source: Ambulatory Visit | Attending: Family Medicine | Admitting: Family Medicine

## 2019-07-04 DIAGNOSIS — R7989 Other specified abnormal findings of blood chemistry: Secondary | ICD-10-CM | POA: Diagnosis not present

## 2019-07-04 DIAGNOSIS — K7689 Other specified diseases of liver: Secondary | ICD-10-CM | POA: Diagnosis not present

## 2019-07-08 ENCOUNTER — Other Ambulatory Visit: Payer: Self-pay | Admitting: Family Medicine

## 2019-07-08 DIAGNOSIS — R932 Abnormal findings on diagnostic imaging of liver and biliary tract: Secondary | ICD-10-CM

## 2019-07-08 DIAGNOSIS — R7401 Elevation of levels of liver transaminase levels: Secondary | ICD-10-CM

## 2019-07-19 ENCOUNTER — Other Ambulatory Visit: Payer: Self-pay | Admitting: Family Medicine

## 2019-07-20 ENCOUNTER — Encounter: Payer: Self-pay | Admitting: Family Medicine

## 2019-07-21 ENCOUNTER — Other Ambulatory Visit: Payer: Self-pay | Admitting: Family Medicine

## 2019-07-21 DIAGNOSIS — E119 Type 2 diabetes mellitus without complications: Secondary | ICD-10-CM

## 2019-07-23 ENCOUNTER — Ambulatory Visit: Payer: Medicare HMO

## 2019-07-23 ENCOUNTER — Encounter: Payer: Medicare HMO | Admitting: Family Medicine

## 2019-07-27 ENCOUNTER — Other Ambulatory Visit: Payer: Self-pay | Admitting: Family Medicine

## 2019-07-27 DIAGNOSIS — I10 Essential (primary) hypertension: Secondary | ICD-10-CM

## 2019-08-12 ENCOUNTER — Other Ambulatory Visit: Payer: Self-pay | Admitting: Family Medicine

## 2019-09-11 ENCOUNTER — Ambulatory Visit: Payer: Medicare HMO | Admitting: Gastroenterology

## 2019-09-14 ENCOUNTER — Other Ambulatory Visit: Payer: Self-pay | Admitting: Family Medicine

## 2019-09-18 ENCOUNTER — Ambulatory Visit (INDEPENDENT_AMBULATORY_CARE_PROVIDER_SITE_OTHER): Payer: Medicare HMO | Admitting: Family Medicine

## 2019-09-18 ENCOUNTER — Other Ambulatory Visit: Payer: Self-pay

## 2019-09-18 ENCOUNTER — Encounter: Payer: Self-pay | Admitting: Family Medicine

## 2019-09-18 VITALS — BP 140/60 | HR 87 | Temp 97.3°F | Resp 16 | Ht 66.0 in | Wt 172.0 lb

## 2019-09-18 DIAGNOSIS — I10 Essential (primary) hypertension: Secondary | ICD-10-CM

## 2019-09-18 DIAGNOSIS — E119 Type 2 diabetes mellitus without complications: Secondary | ICD-10-CM | POA: Diagnosis not present

## 2019-09-18 DIAGNOSIS — Z1211 Encounter for screening for malignant neoplasm of colon: Secondary | ICD-10-CM

## 2019-09-18 DIAGNOSIS — E7800 Pure hypercholesterolemia, unspecified: Secondary | ICD-10-CM | POA: Diagnosis not present

## 2019-09-18 DIAGNOSIS — Z1231 Encounter for screening mammogram for malignant neoplasm of breast: Secondary | ICD-10-CM

## 2019-09-18 DIAGNOSIS — E1122 Type 2 diabetes mellitus with diabetic chronic kidney disease: Secondary | ICD-10-CM | POA: Diagnosis not present

## 2019-09-18 DIAGNOSIS — N1832 Chronic kidney disease, stage 3b: Secondary | ICD-10-CM | POA: Diagnosis not present

## 2019-09-18 DIAGNOSIS — Z0000 Encounter for general adult medical examination without abnormal findings: Secondary | ICD-10-CM | POA: Diagnosis not present

## 2019-09-18 DIAGNOSIS — E559 Vitamin D deficiency, unspecified: Secondary | ICD-10-CM | POA: Insufficient documentation

## 2019-09-18 DIAGNOSIS — I471 Supraventricular tachycardia: Secondary | ICD-10-CM

## 2019-09-18 NOTE — Patient Instructions (Addendum)
.   Please review the attached list of medications and notify my office if there are any errors.   . Please bring all of your medications to every appointment so we can make sure that our medication list is the same as yours.   . Please contact your eyecare professional to schedule a routine eye exam    

## 2019-09-18 NOTE — Progress Notes (Signed)
Patient: Anna Sweeney, Female    DOB: 1950/11/30, 69 y.o.   MRN: [id number] Visit Date: 09/18/2019  Today's Provider: Lelon Huh, MD   Chief Complaint  Patient presents with  . Medicare Wellness   Subjective:     Annual wellness visit Anna Sweeney is a 69 y.o. female. She feels fairly well. She reports  No regular exercising. She reports she is sleeping fairly well.  -----------------------------------------------------------   Diabetes Mellitus Type II, Follow-up:   Lab Results  Component Value Date   HGBA1C 7.6 (H) 06/15/2019   HGBA1C 7.6 (A) 01/16/2019   HGBA1C 7.9 (H) 07/18/2018    Last seen for diabetes 3 months ago.  Management since then includes no changes. She reports poor compliance with treatment. Has not been taking Trulicity. She is not having side effects.  Current symptoms include none and have been stable. Home blood sugar records: blood sugars are not checked   Episodes of hypoglycemia? no   Current insulin regiment: Is not on insulin Most Recent Eye Exam: not UTD Weight trend: fluctuating a bit Prior visit with dietician: No Current exercise: none Current diet habits: well balanced  Pertinent Labs:    Component Value Date/Time   CHOL 176 06/15/2019 1137   TRIG 238 (H) 06/15/2019 1137   HDL 36 (L) 06/15/2019 1137   LDLCALC 99 06/15/2019 1137   CREATININE 1.82 (H) 06/15/2019 1137    Wt Readings from Last 3 Encounters:  09/18/19 172 lb (78 kg)  06/15/19 169 lb (76.7 kg)  01/16/19 170 lb (77.1 kg)    ------------------------------------------------------------------------  Hypertension, follow-up:  BP Readings from Last 3 Encounters:  09/18/19 140/60  06/15/19 132/64  01/16/19 (!) 150/82    She was last seen for hypertension 3 months ago.  BP at that visit was 132/64. Management since that visit includes starting Amlodipine 83m daily due to declining kidney functions. She reports good compliance with treatment. She is  not having side effects.  She is not exercising. She is adherent to low salt diet.   Outside blood pressures are not checked. She is experiencing none.  Patient denies chest pain, chest pressure/discomfort, claudication, dyspnea, exertional chest pressure/discomfort, fatigue, irregular heart beat, lower extremity edema, near-syncope, orthopnea, palpitations, paroxysmal nocturnal dyspnea, syncope and tachypnea.   Cardiovascular risk factors include advanced age (older than 573for men, 677for women), diabetes mellitus and hypertension.  Use of agents associated with hypertension: NSAIDS.     Weight trend: fluctuating a bit Wt Readings from Last 3 Encounters:  09/18/19 172 lb (78 kg)  06/15/19 169 lb (76.7 kg)  01/16/19 170 lb (77.1 kg)    Current diet: well balanced  ------------------------------------------------------------------------  Follow up for Vitamin D Deficiency:  The patient was last seen for this 3 months ago. Changes made at last visit include starting Vitamin D3 1,000uits daily.  She reports good compliance with treatment. She feels that condition is Unchanged. She is not having side effects.   ------------------------------------------------------------------------------------  Review of Systems  Constitutional: Negative for chills, fatigue and fever.  HENT: Negative for congestion, ear pain, rhinorrhea, sneezing and sore throat.   Eyes: Negative.  Negative for pain and redness.  Respiratory: Negative for cough, shortness of breath and wheezing.   Cardiovascular: Negative for chest pain and leg swelling.  Gastrointestinal: Negative for abdominal pain, blood in stool, constipation, diarrhea and nausea.  Endocrine: Positive for polydipsia. Negative for polyphagia.  Genitourinary: Negative.  Negative for dysuria, flank pain, hematuria, pelvic pain,  vaginal bleeding and vaginal discharge.  Musculoskeletal: Positive for back pain. Negative for arthralgias, gait  problem and joint swelling.  Skin: Negative for rash.  Neurological: Negative.  Negative for dizziness, tremors, seizures, weakness, light-headedness, numbness and headaches.  Hematological: Negative for adenopathy.  Psychiatric/Behavioral: Negative.  Negative for behavioral problems, confusion and dysphoric mood. The patient is not nervous/anxious and is not hyperactive.     Social History   Socioeconomic History  . Marital status: Widowed    Spouse name: Not on file  . Number of children: 2  . Years of education: Not on file  . Highest education level: Some college, no degree  Occupational History  . Occupation: retired  Tobacco Use  . Smoking status: Former Smoker    Packs/day: 1.00    Years: 4.00    Pack years: 4.00  . Smokeless tobacco: Never Used  . Tobacco comment: quit in 1991  Substance and Sexual Activity  . Alcohol use: No  . Drug use: No  . Sexual activity: Not on file  Other Topics Concern  . Not on file  Social History Narrative  . Not on file   Social Determinants of Health   Financial Resource Strain:   . Difficulty of Paying Living Expenses: Not on file  Food Insecurity:   . Worried About Charity fundraiser in the Last Year: Not on file  . Ran Out of Food in the Last Year: Not on file  Transportation Needs:   . Lack of Transportation (Medical): Not on file  . Lack of Transportation (Non-Medical): Not on file  Physical Activity:   . Days of Exercise per Week: Not on file  . Minutes of Exercise per Session: Not on file  Stress:   . Feeling of Stress : Not on file  Social Connections:   . Frequency of Communication with Friends and Family: Not on file  . Frequency of Social Gatherings with Friends and Family: Not on file  . Attends Religious Services: Not on file  . Active Member of Clubs or Organizations: Not on file  . Attends Archivist Meetings: Not on file  . Marital Status: Not on file  Intimate Partner Violence:   . Fear of  Current or Ex-Partner: Not on file  . Emotionally Abused: Not on file  . Physically Abused: Not on file  . Sexually Abused: Not on file    Past Medical History:  Diagnosis Date  . BRCA negative 11/15/2012   [facility]  . Diabetes mellitus without complication ([place])   . History of chicken pox   . Hypertension      Patient Active Problem List   Diagnosis Date Noted  . Abnormal ultrasound of liver 07/08/2019  . SVT (supraventricular tachycardia) ([place]) 06/15/2019  . Elevated transaminase level 11/17/2016  . Nodule of left lung 05/13/2015  . 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 Eczema 04/30/2015  . High cholesterol   . Family history of malignant neoplasm of breast   . Hypertension   . Chronic kidney disease, stage 3b 05/18/2011  . Seborrhea 07/21/2009  . Diabetes mellitus, type 2 ([place]) 01/15/2009    Past Surgical History:  Procedure Laterality Date  . FOOT SURGERY      Her family history includes Breast cancer in her mother and sister; Cancer in her sister.   Current Outpatient Medications:  .  Accu-Chek Softclix Lancets lancets, Use to check blood sugar daily for type 2 diabetes. E11.9, Disp: 100 each, Rfl: 4 .  amLODipine (NORVASC)  5 MG tablet, TAKE 1 TABLET BY MOUTH EVERY DAY, Disp: 30 tablet, Rfl: 2 .  aspirin EC 81 MG tablet, Take 81 mg by mouth daily., Disp: , Rfl:  .  betamethasone dipropionate (DIPROLENE) 0.05 % cream, Apply topically 2 (two) times daily as needed., Disp: 30 g, Rfl: 1 .  Blood Glucose Monitoring Suppl (ACCU-CHEK AVIVA PLUS) w/Device KIT, Use to check blood sugar daily for type 2 diabetes. E11.9, Disp: 1 kit, Rfl: 0 .  Cholecalciferol (VITAMIN D-3 PO), Take 1 tablet by mouth daily., Disp: , Rfl:  .  glipiZIDE (GLUCOTROL XL) 5 MG 24 hr tablet, TAKE 1 TABLET BY MOUTH EVERY DAY, Disp: 90 tablet, Rfl: 4 .  glucose blood (ACCU-CHEK AVIVA PLUS) test strip, Use to check blood sugar daily for type 2 diabetes. E11.9, Disp: 100 each, Rfl: 4 .  lovastatin (MEVACOR) 20  MG tablet, TAKE 1 TABLET BY MOUTH (20 MG TOTAL) AT BEDTIME, Disp: 90 tablet, Rfl: 4 .  metFORMIN (GLUCOPHAGE) 1000 MG tablet, TAKE 1 TABLET BY MOUTH TWICE A DAY, Disp: 180 tablet, Rfl: 3 .  trandolapril (MAVIK) 4 MG tablet, TAKE 1 TABLET BY MOUTH (4 MG TOTAL) DAILY, Disp: 90 tablet, Rfl: 0 .  verapamil (VERELAN PM) 240 MG 24 hr capsule, TAKE 1 CAPSULE (240 MG TOTAL) BY MOUTH AT BEDTIME., Disp: 90 capsule, Rfl: 0 .  Dulaglutide 1.5 MG/0.5ML SOPN, INJECT 1.5 MG INTO THE SKIN ONCE A WEEK. (Patient not taking: Reported on 09/18/2019), Disp: 4 pen, Rfl: 12  Patient Care Team: [name], MD as PCP - General (Family Medicine) Jillyn Hidden D as Referring Physician (Chiropractic Medicine)    Objective:    Vitals: BP 140/60 (BP Location: Right Arm, Cuff Size: Large)   Pulse 87   Temp (!) 97.3 F (36.3 C) (Temporal)   Resp 16   Ht 5' 6"  (1.676 m)   Wt 172 lb (78 kg)   SpO2 97% Comment: room air  BMI 27.76 kg/m     Activities of Daily Living In your present state of health, do you have any difficulty performing the following activities: 09/18/2019  Hearing? N  Vision? N  Difficulty concentrating or making decisions? N  Walking or climbing stairs? N  Dressing or bathing? N  Doing errands, shopping? N  Some recent data might be hidden    Fall Risk Assessment Fall Risk  09/18/2019 07/17/2018 11/28/2017 11/16/2016  Falls in the past year? 0 0 No No  Number falls in past yr: 0 - - -  Injury with Fall? 0 - - -  Follow up Falls evaluation completed - - -     Depression Screen PHQ 2/9 Scores 09/18/2019 07/17/2018 11/28/2017 11/16/2016  PHQ - 2 Score 0 1 0 0  PHQ- 9 Score - - 0 2     Cognitive Testing - 6-CIT  Correct? Score   What year is it? yes 0 0 or 4  What month is it? yes 0 0 or 3  Memorize:    Pia Mau,  43,  [place],      What time is it? (within 1 hour) yes 0 0 or 3  Count backwards from 20 yes 0 0, 2, or 4  Name the months of the year yes 0 0, 2, or 4    Repeat name & address above no 1 0, 2, 4, 6, 8, or 10       TOTAL SCORE  1/28   Interpretation:  Normal  Normal (0-7) Abnormal (8-28)   No flowsheet data found.    Assessment & Plan:     Annual Wellness Visit  Reviewed patient's Family Medical History Reviewed and updated list of patient's medical providers Assessment of cognitive impairment was done Assessed patient's functional ability Established a written schedule for health screening [place] Completed and Reviewed  Exercise Activities and Dietary recommendations Goals    . DIET - INCREASE WATER INTAKE     Recommend increasing water intake to at least 3 glasses every day.        Immunization History  Administered Date(s) Administered  . Fluad Quad(high Dose 65+) 06/15/2019  . Influenza, High Dose Seasonal PF 04/19/2018  . Influenza,inj,Quad PF,6+ Mos 07/17/2015, 07/09/2016  . Pneumococcal Conjugate-13 11/16/2016  . Pneumococcal Polysaccharide-23 09/09/2011, 07/17/2018  . Tdap 07/10/2013    Health Maintenance  Topic Date Due  . FOOT EXAM  08/12/1961  . OPHTHALMOLOGY EXAM  01/17/2019  . Fecal DNA (Cologuard)  02/02/2019  . HEMOGLOBIN A1C  12/13/2019  . MAMMOGRAM  04/06/2020  . DEXA SCAN  08/11/2022  . TETANUS/TDAP  07/11/2023  . INFLUENZA VACCINE  Completed  . Hepatitis C Screening  Completed  . PNA vac Low Risk Adult  Completed     Discussed health benefits of physical activity, and encouraged her to engage in regular exercise appropriate for her age and condition.    ------------------------------------------------------------------------------------------------------------    Lelon Huh, MD  [facility]

## 2019-09-19 LAB — COMPREHENSIVE METABOLIC PANEL
ALT: 40 IU/L — ABNORMAL HIGH (ref 0–32)
AST: 67 IU/L — ABNORMAL HIGH (ref 0–40)
Albumin/Globulin Ratio: 1.4 (ref 1.2–2.2)
Albumin: 4.5 g/dL (ref 3.8–4.8)
Alkaline Phosphatase: 81 IU/L (ref 39–117)
BUN/Creatinine Ratio: 14 (ref 12–28)
BUN: 24 mg/dL (ref 8–27)
Bilirubin Total: 0.3 mg/dL (ref 0.0–1.2)
CO2: 18 mmol/L — ABNORMAL LOW (ref 20–29)
Calcium: 10.1 mg/dL (ref 8.7–10.3)
Chloride: 103 mmol/L (ref 96–106)
Creatinine, Ser: 1.67 mg/dL — ABNORMAL HIGH (ref 0.57–1.00)
GFR calc Af Amer: 36 mL/min/{1.73_m2} — ABNORMAL LOW (ref >59)
GFR calc non Af Amer: 31 mL/min/{1.73_m2} — ABNORMAL LOW (ref >59)
Globulin, Total: 3.2 g/dL (ref 1.5–4.5)
Glucose: 144 mg/dL — ABNORMAL HIGH (ref 65–99)
Potassium: 4.7 mmol/L (ref 3.5–5.2)
Sodium: 141 mmol/L (ref 134–144)
Total Protein: 7.7 g/dL (ref 6.0–8.5)

## 2019-09-19 LAB — HEMOGLOBIN A1C
Est. average glucose Bld gHb Est-mCnc: 255 mg/dL
Hgb A1c MFr Bld: 10.5 % — ABNORMAL HIGH (ref 4.8–5.6)

## 2019-09-19 LAB — VITAMIN D 25 HYDROXY (VIT D DEFICIENCY, FRACTURES): Vit D, 25-Hydroxy: 32.9 ng/mL (ref 30.0–100.0)

## 2019-09-20 ENCOUNTER — Telehealth: Payer: Self-pay

## 2019-09-20 DIAGNOSIS — E119 Type 2 diabetes mellitus without complications: Secondary | ICD-10-CM

## 2019-09-20 MED ORDER — DULAGLUTIDE 1.5 MG/0.5ML ~~LOC~~ SOAJ: 1.5000 mg | SUBCUTANEOUS | 0 refills | Status: DC

## 2019-09-20 NOTE — Telephone Encounter (Signed)
-----   Message from Malva Limes, MD sent at 09/19/2019 11:54 AM EST ----- A1c is much too high at 10.5. Need to start back on Trulicity. Can she have 2 of 0.75 mg samples pens to use once a week, then up to 1.5mg  sample pens once a week. Call us about a week before last pen and we will try to get more samples.   Rest of labs are normal. Continue other meds unchanged. Schedule follow up o.v. in 3 months.

## 2019-09-20 NOTE — Telephone Encounter (Signed)
Patient advised and agrees with treatment plan. Samples placed in the refrigerator for patient to pick up. Patient given 2 sample pens of the 0.75mg  dose, and 2 sample pens of the 1.5mg  dose which should last her for 4 weeks.

## 2019-09-21 ENCOUNTER — Ambulatory Visit: Payer: Medicare HMO | Admitting: Gastroenterology

## 2019-09-28 NOTE — Progress Notes (Signed)
Patient: Anna Sweeney, Female    DOB: [birth year], 69 y.o.   MRN: [id number] Visit Date: 09/28/2019  Today's Provider: Lelon Huh, MD   Chief Complaint  Patient presents with  . Annual Exam  . Follow-up    Chronic medical problems   Subjective:     Annual physical exam Anna Sweeney is a 69 y.o. female who presents today for health maintenance and complete physical. She feels fairly well. She reports exercising occasionally. She reports she is sleeping fairly well.  ----------------------------------------------------------------- She is also due for labs and follow up several chronic medical problems. She states she does go to [facility] every year or two   Social History      She  reports that she has quit smoking. She has a 4.00 pack-year smoking history. She has never used smokeless tobacco. She reports that she does not drink alcohol or use drugs.       Social History   Socioeconomic History  . Marital status: Widowed    Spouse name: Not on file  . Number of children: 2  . Years of education: Not on file  . Highest education level: Some college, no degree  Occupational History  . Occupation: retired  Tobacco Use  . Smoking status: Former Smoker    Packs/day: 1.00    Years: 4.00    Pack years: 4.00  . Smokeless tobacco: Never Used  . Tobacco comment: quit in 1991  Substance and Sexual Activity  . Alcohol use: No  . Drug use: No  . Sexual activity: Not on file  Other Topics Concern  . Not on file  Social History Narrative  . Not on file   Social Determinants of Health   Financial Resource Strain:   . Difficulty of Paying Living Expenses: Not on file  Food Insecurity:   . Worried About Charity fundraiser in the Last Year: Not on file  . Ran Out of Food in the Last Year: Not on file  Transportation Needs:   . Lack of Transportation (Medical): Not on file  . Lack of Transportation (Non-Medical): Not on file  Physical Activity:   . Days of  Exercise per Week: Not on file  . Minutes of Exercise per Session: Not on file  Stress:   . Feeling of Stress : Not on file  Social Connections:   . Frequency of Communication with Friends and Family: Not on file  . Frequency of Social Gatherings with Friends and Family: Not on file  . Attends Religious Services: Not on file  . Active Member of Clubs or Organizations: Not on file  . Attends Archivist Meetings: Not on file  . Marital Status: Not on file    Past Medical History:  Diagnosis Date  . BRCA negative 11/15/2012   [facility]  . Diabetes mellitus without complication ([place])   . History of chicken pox   . Hypertension      Patient Active Problem List   Diagnosis Date Noted  . Vitamin D deficiency 09/18/2019  . Abnormal ultrasound of liver 07/08/2019  . SVT (supraventricular tachycardia) ([place]) 06/15/2019  . Elevated transaminase level 11/17/2016  . Nodule of left lung 05/13/2015  . 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 Eczema 04/30/2015  . High cholesterol   . Family history of malignant neoplasm of breast   . Hypertension   . Chronic kidney disease, stage 3b 05/18/2011  . Seborrhea 07/21/2009  . Diabetes mellitus, type 2 ([place]) 01/15/2009  Past Surgical History:  Procedure Laterality Date  . FOOT SURGERY      Family History        Family Status  Relation Name Status  . Mother  Deceased at age 9       cancer  . Sister 1 Deceased  . Father  Deceased at age 62       Skin cancer and heart disease  . Sister  (Not Specified)        Her family history includes Breast cancer in her mother and sister; Cancer in her sister.      Allergies  Allergen Reactions  . Pioglitazone     Dizziness     Current Outpatient Medications:  .  Accu-Chek Softclix Lancets lancets, Use to check blood sugar daily for type 2 diabetes. E11.9, Disp: 100 each, Rfl: 4 .  amLODipine (NORVASC) 5 MG tablet, TAKE 1 TABLET BY MOUTH EVERY DAY, Disp: 30 tablet, Rfl: 2 .  aspirin EC  81 MG tablet, Take 81 mg by mouth daily., Disp: , Rfl:  .  betamethasone dipropionate (DIPROLENE) 0.05 % cream, Apply topically 2 (two) times daily as needed., Disp: 30 g, Rfl: 1 .  Blood Glucose Monitoring Suppl (ACCU-CHEK AVIVA PLUS) w/Device KIT, Use to check blood sugar daily for type 2 diabetes. E11.9, Disp: 1 kit, Rfl: 0 .  Cholecalciferol (VITAMIN D-3 PO), Take 1 tablet by mouth daily., Disp: , Rfl:  .  glipiZIDE (GLUCOTROL XL) 5 MG 24 hr tablet, TAKE 1 TABLET BY MOUTH EVERY DAY, Disp: 90 tablet, Rfl: 4 .  glucose blood (ACCU-CHEK AVIVA PLUS) test strip, Use to check blood sugar daily for type 2 diabetes. E11.9, Disp: 100 each, Rfl: 4 .  lovastatin (MEVACOR) 20 MG tablet, TAKE 1 TABLET BY MOUTH (20 MG TOTAL) AT BEDTIME, Disp: 90 tablet, Rfl: 4 .  metFORMIN (GLUCOPHAGE) 1000 MG tablet, TAKE 1 TABLET BY MOUTH TWICE A DAY, Disp: 180 tablet, Rfl: 3 .  trandolapril (MAVIK) 4 MG tablet, TAKE 1 TABLET BY MOUTH (4 MG TOTAL) DAILY, Disp: 90 tablet, Rfl: 0 .  verapamil (VERELAN PM) 240 MG 24 hr capsule, TAKE 1 CAPSULE (240 MG TOTAL) BY MOUTH AT BEDTIME., Disp: 90 capsule, Rfl: 0 .  Dulaglutide 1.5 MG/0.5ML SOPN, Inject 1.5 mg into the skin once a week., Disp: 4 pen, Rfl: 0   Patient Care Team: [name], MD as PCP - General (Family Medicine) Garth Bigness as Referring Physician (Chiropractic Medicine) [facility] [place]    Objective:    Vitals: BP 140/60 (BP Location: Right Arm, Cuff Size: Large)   Pulse 87   Temp (!) 97.3 F (36.3 C) (Temporal)   Resp 16   Ht 5' 6"  (1.676 m)   Wt 172 lb (78 kg)   SpO2 97% Comment: room air  BMI 27.76 kg/m    Vitals:   09/18/19 1408 09/18/19 1411  BP: (!) 142/62 140/60  Pulse: 87   Resp: 16   Temp: (!) 97.3 F (36.3 C)   TempSrc: Temporal   SpO2: 97%   Weight: 172 lb (78 kg)   Height: 5' 6"  (1.676 m)      Physical Exam   General Appearance:     Overweight female. Alert, cooperative, in no acute distress,  appears stated age   Head:    Normocephalic, without obvious abnormality, atraumatic  Eyes:    PERRL, conjunctiva/corneas clear, EOM's intact, fundi    benign, both eyes  Ears:  Normal TM's and external ear canals, both ears  Nose:   Nares normal, septum midline, mucosa normal, no drainage    or sinus tenderness  Throat:   Lips, mucosa, and tongue normal; teeth and gums normal  Neck:   Supple, symmetrical, trachea midline, no adenopathy;    thyroid:  no enlargement/tenderness/nodules; no carotid   bruit or JVD  Back:     Symmetric, no curvature, ROM normal, no CVA tenderness  Lungs:     Clear to auscultation bilaterally, respirations unlabored  Chest Wall:    No tenderness or deformity   Heart:    Normal heart rate. Normal rhythm. No murmurs, rubs, or gallops.   Breast Exam:    normal appearance, no masses or tenderness, deferred  Abdomen:     Soft, non-tender, bowel sounds active all four quadrants,    no masses, no organomegaly  Pelvic:    deferred  Extremities:   All extremities are intact. No cyanosis or edema  Pulses:   2+ and symmetric all extremities  Skin:   Skin color, texture, turgor normal, no rashes or lesions  Lymph nodes:   Cervical, supraclavicular, and axillary nodes normal  Neurologic:   CNII-XII intact, normal strength, sensation and reflexes    throughout    Depression Screen PHQ 2/9 Scores 09/18/2019 07/17/2018 11/28/2017 11/16/2016  PHQ - 2 Score 0 1 0 0  PHQ- 9 Score - - 0 2       Assessment & Plan:     Routine Health Maintenance and Physical Exam  Exercise Activities and Dietary recommendations Goals    . DIET - INCREASE WATER INTAKE     Recommend increasing water intake to at least 3 glasses every day.        Immunization History  Administered Date(s) Administered  . Fluad Quad(high Dose 65+) 06/15/2019  . Influenza, High Dose Seasonal PF 04/19/2018  . Influenza,inj,Quad PF,6+ Mos 07/17/2015, 07/09/2016  . Pneumococcal Conjugate-13 11/16/2016    . Pneumococcal Polysaccharide-23 09/09/2011, 07/17/2018  . Tdap 07/10/2013    Health Maintenance  Topic Date Due  . FOOT EXAM  08/12/1961  . OPHTHALMOLOGY EXAM  01/17/2019  . Fecal DNA (Cologuard)  02/02/2019  . HEMOGLOBIN A1C  03/17/2020  . MAMMOGRAM  04/06/2020  . DEXA SCAN  08/11/2022  . TETANUS/TDAP  07/11/2023  . INFLUENZA VACCINE  Completed  . Hepatitis C Screening  Completed  . PNA vac Low Risk Adult  Completed     Discussed health benefits of physical activity, and encouraged her to engage in regular exercise appropriate for her age and condition.    1. Annual physical exam Generally doing well, unremarkable exam.   2. Colon cancer screening  - Cologuard  3. Type 2 diabetes mellitus without complication, without long-term current use of insulin (HCC) Doing well on current medications.  - Hemoglobin A1c  4. High cholesterol She is tolerating lovastatin well with no adverse effects.    5. Chronic kidney disease, stage 3b Check labs, encourage liberal water intake.   6. SVT (supraventricular tachycardia) (HCC) Well controlled on verapamil - EKG 12-Lead  7. Essential hypertension Adequately controlled.  Continue current medications.   - EKG 12-Lead - Comprehensive metabolic panel  8. Vitamin D deficiency  - VITAMIN D 25 Hydroxy (Vit-D Deficiency, Fractures)  9. Breast cancer screening by mammogram  - MM 3D SCREEN BREAST BILATERAL; Future   Lelon Huh, MD  [facility]

## 2019-10-17 ENCOUNTER — Telehealth: Payer: Self-pay

## 2019-10-17 NOTE — Telephone Encounter (Signed)
She can have 2 of the 1.5mg  Trulicity pens.

## 2019-10-17 NOTE — Telephone Encounter (Signed)
Patient advised samples are ready for pick up.  

## 2019-10-17 NOTE — Telephone Encounter (Signed)
Copied from CRM [id number]. Topic: General - Other >> Oct 17, 2019  8:19 AM Anna Sweeney wrote: Reason for CRM: Pt called and is requesting to know if there are samples of trulicity available for her to have. Please advise.

## 2019-10-19 ENCOUNTER — Other Ambulatory Visit: Payer: Self-pay | Admitting: Family Medicine

## 2019-10-19 DIAGNOSIS — I10 Essential (primary) hypertension: Secondary | ICD-10-CM

## 2019-10-25 ENCOUNTER — Ambulatory Visit
Admission: RE | Admit: 2019-10-25 | Discharge: 2019-10-25 | Disposition: A | Discharge status: Home / Self Care | Payer: Medicare HMO | Source: Ambulatory Visit | Attending: Family Medicine | Admitting: Family Medicine

## 2019-10-25 DIAGNOSIS — Z1231 Encounter for screening mammogram for malignant neoplasm of breast: Secondary | ICD-10-CM

## 2019-10-25 IMAGING — MG DIGITAL SCREENING BILAT W/ TOMO W/ CAD
6 of 10 series · 6 of 30 positions shown · non-contrast
Comparison: Previous exam(s).

CLINICAL DATA: Screening.

EXAM:
DIGITAL SCREENING BILATERAL MAMMOGRAM WITH TOMO AND CAD

[R MLO synth-2D]
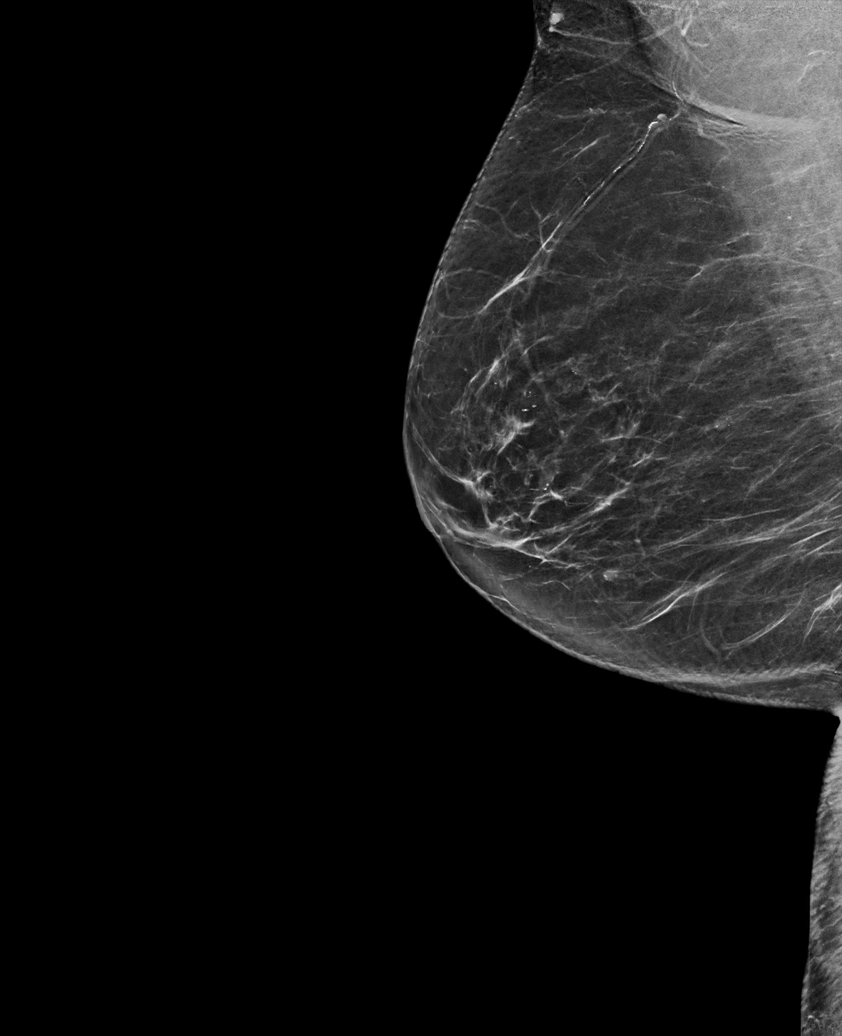

[L MLO synth-2D (1 of 2)]
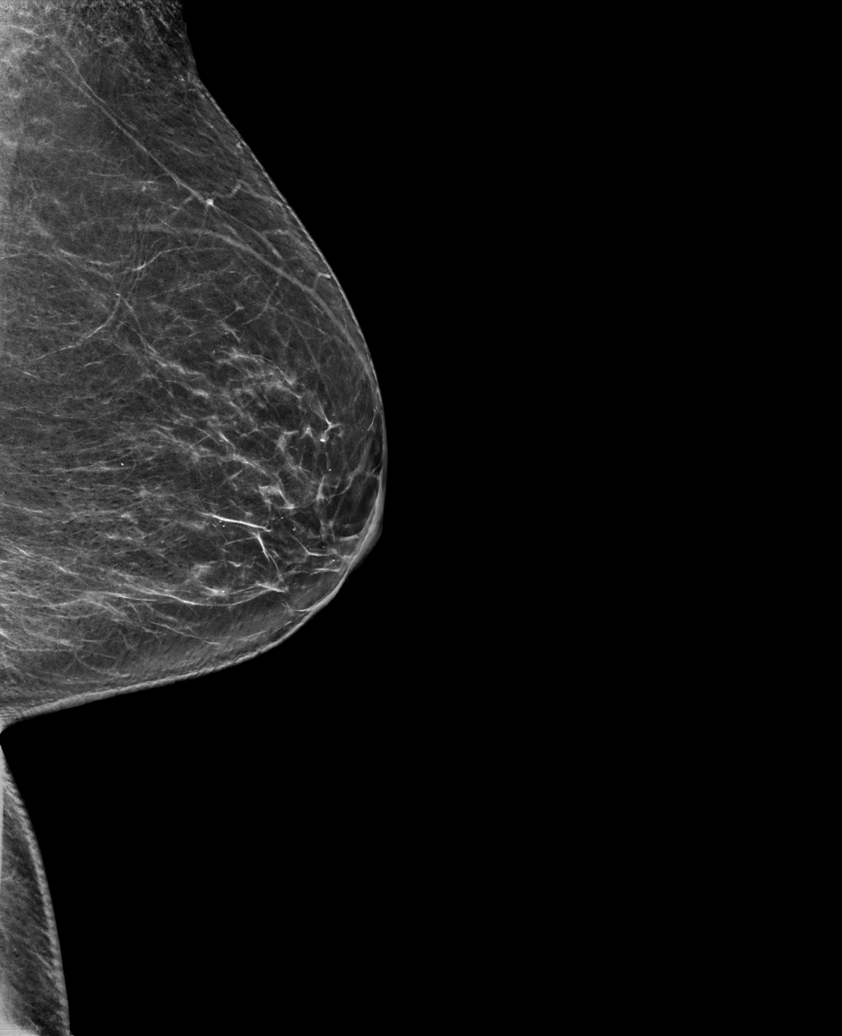

[L CC synth-2D]
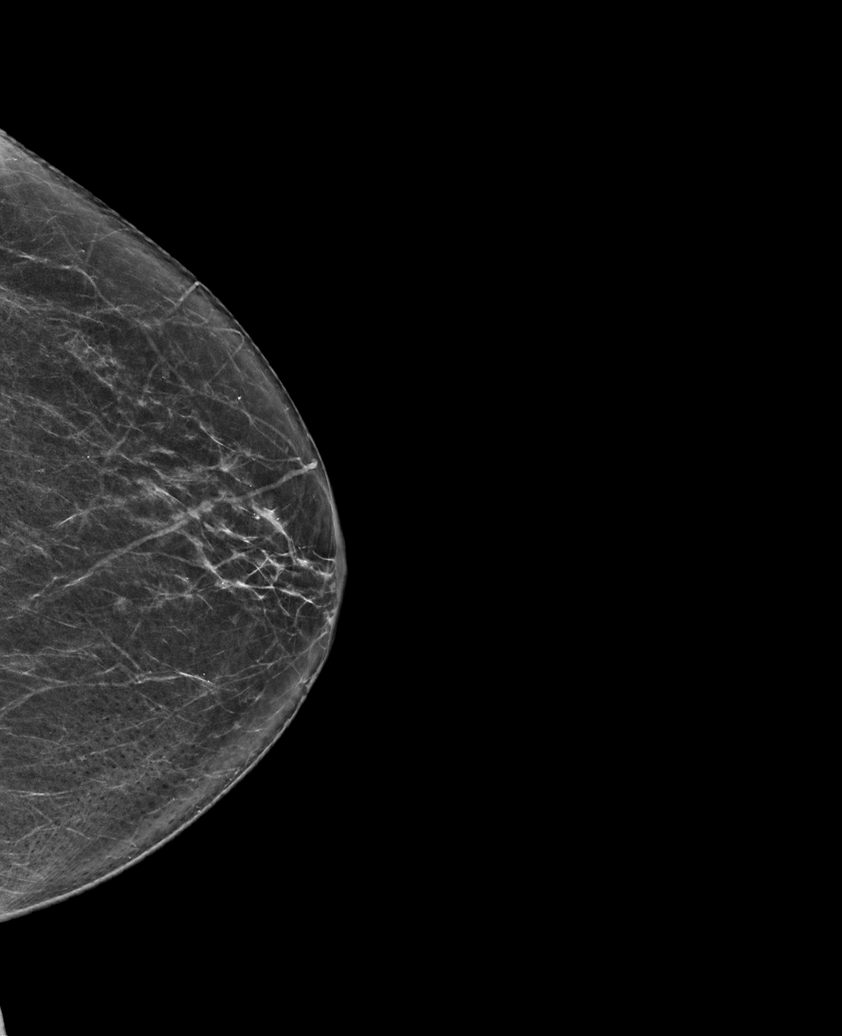

[L MLO synth-2D (2 of 2)]
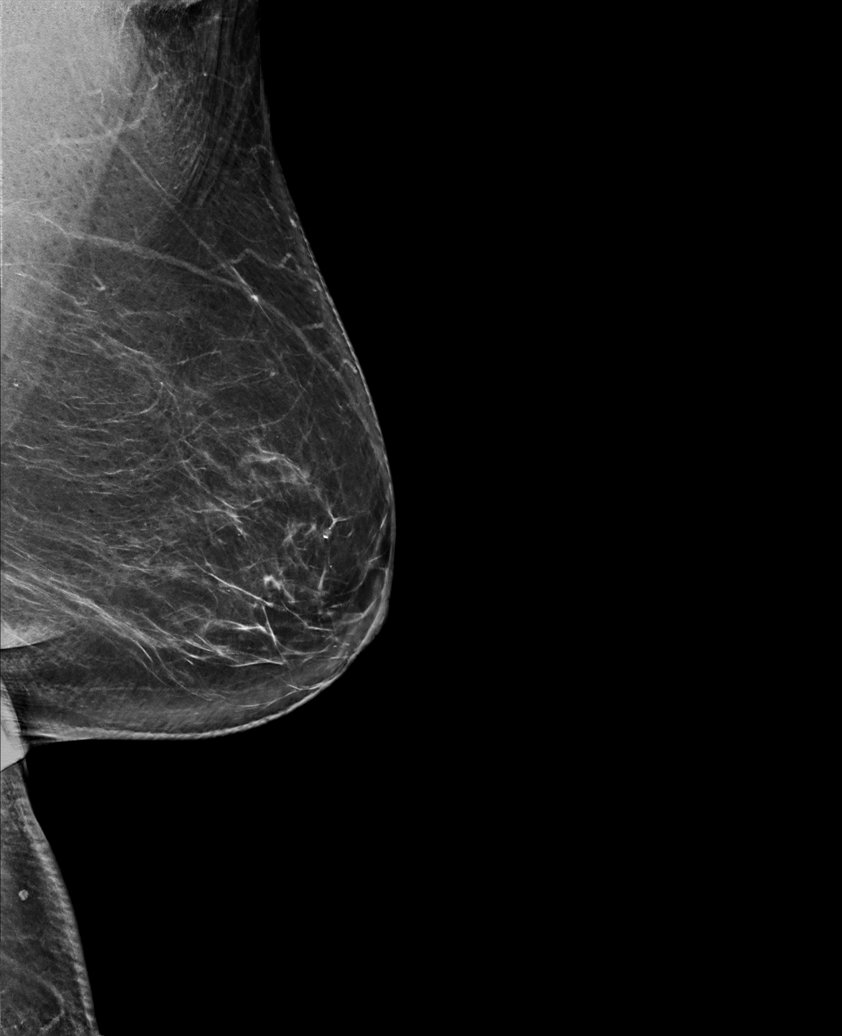

[R CC synth-2D]
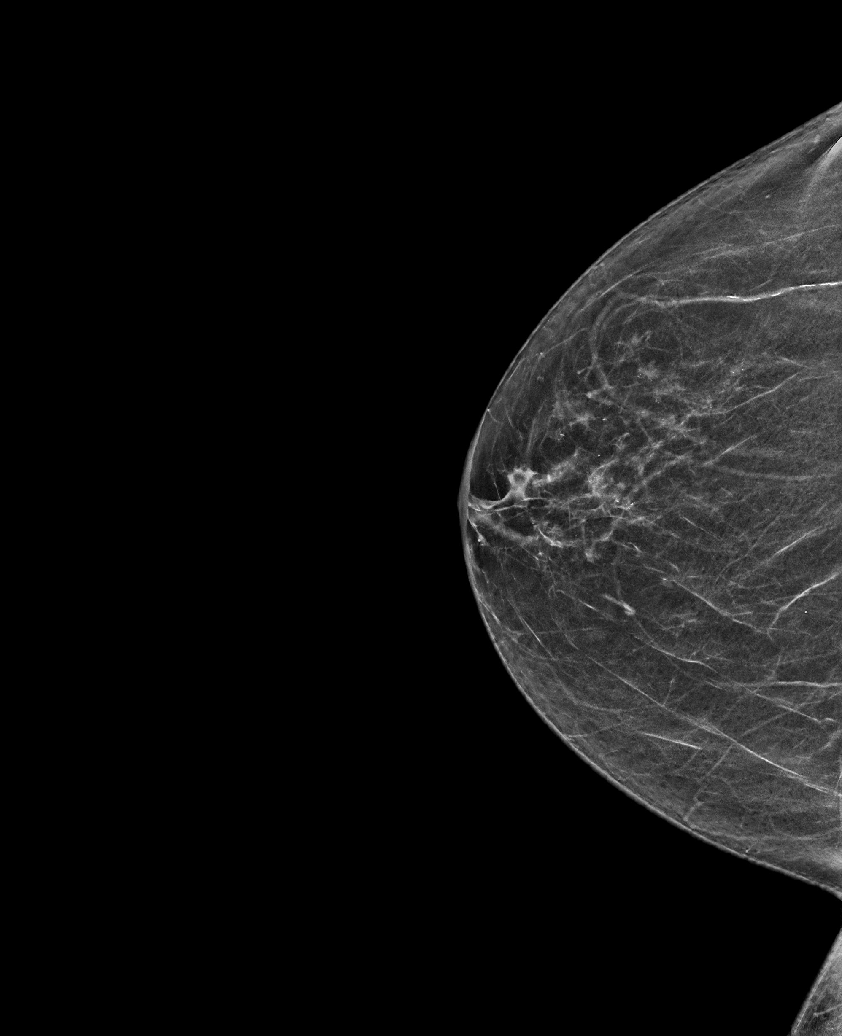

[L MLO tomo · tomo slice 41/80.0]
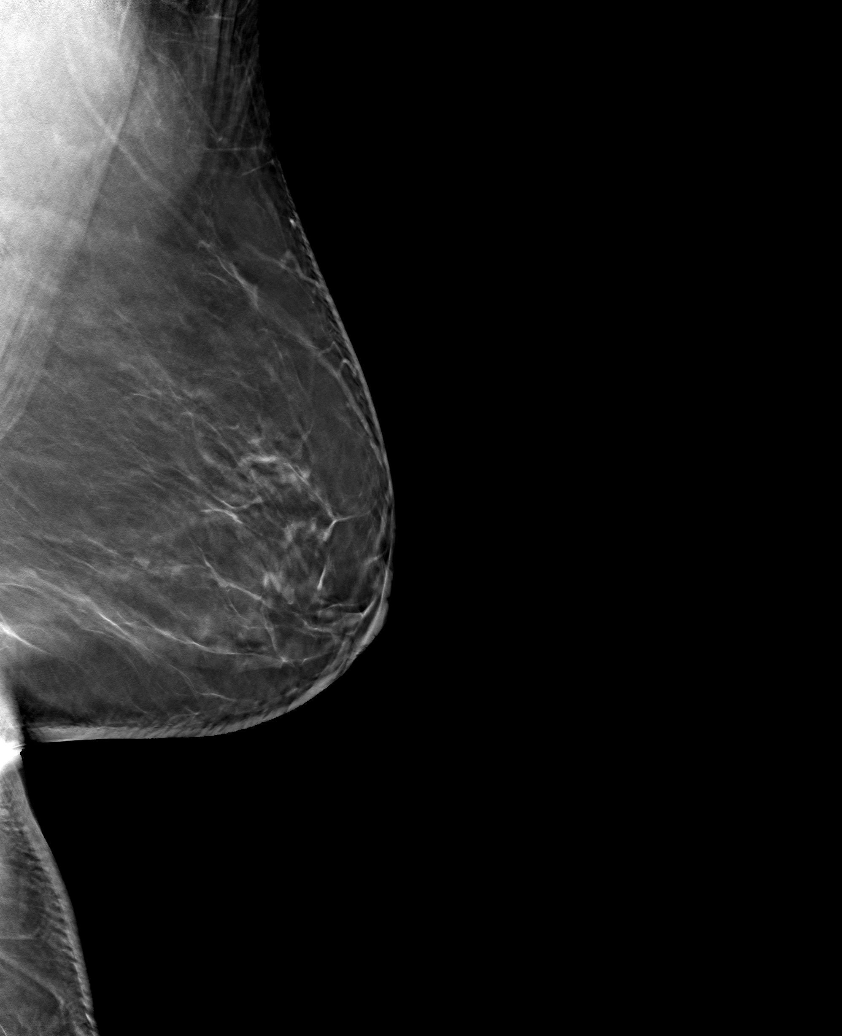

[6 of 30 positions shown; findings below may reference images not displayed]

ACR Breast Density Category b: There are scattered areas of
fibroglandular density.
FINDINGS: There are no findings suspicious for malignancy. Images were
processed with CAD.
IMPRESSION: No mammographic evidence of malignancy. A result letter of this
screening mammogram will be mailed directly to the patient.

RECOMMENDATION:
Screening mammogram in one year. (Code:[TQ])

BI-RADS CATEGORY  1: Negative.

## 2019-11-09 ENCOUNTER — Other Ambulatory Visit: Payer: Self-pay | Admitting: Family Medicine

## 2019-11-09 DIAGNOSIS — E119 Type 2 diabetes mellitus without complications: Secondary | ICD-10-CM

## 2019-11-09 NOTE — Telephone Encounter (Signed)
Patient has appointment 09/18/19

## 2019-11-15 ENCOUNTER — Ambulatory Visit: Payer: Medicare HMO | Admitting: Gastroenterology

## 2019-11-15 ENCOUNTER — Encounter: Payer: Self-pay | Admitting: Gastroenterology

## 2019-11-15 ENCOUNTER — Other Ambulatory Visit: Payer: Self-pay

## 2019-11-15 VITALS — BP 121/68 | HR 80 | Temp 98.2°F | Ht 66.0 in | Wt 169.4 lb

## 2019-11-15 DIAGNOSIS — R7401 Elevation of levels of liver transaminase levels: Secondary | ICD-10-CM

## 2019-11-15 DIAGNOSIS — R932 Abnormal findings on diagnostic imaging of liver and biliary tract: Secondary | ICD-10-CM

## 2019-11-15 DIAGNOSIS — R933 Abnormal findings on diagnostic imaging of other parts of digestive tract: Secondary | ICD-10-CM

## 2019-11-15 NOTE — Progress Notes (Signed)
Gastroenterology Consultation  Referring Provider:     [name], MD Primary Care Physician:  [name], MD Primary Gastroenterologist:  Dr. Allen Norris     Reason for Consultation:     Elevated liver enzymes        HPI:   Anna Sweeney is a 69 y.o. y/o female referred for consultation & management of elevated liver enzymes by Dr. Caryn Section, Kirstie Peri, MD.  This patient was sent to me for evaluation of abnormal liver enzymes.  The patient's abnormal liver enzymes appear to have been going on since 2018.  The patient's labs since then have shown:  Component     Latest Ref Rng & Units 11/16/2016 08/18/2017 07/18/2018 06/15/2019  Total Bilirubin     0.0 - 1.2 mg/dL 0.3 0.8 0.4 0.3  Alkaline Phosphatase     39 - 117 IU/L 60 81 75 75  AST     0 - 40 IU/L 53 (H) 46 (H) 45 (H) 96 (H)  ALT     0 - 32 IU/L 37 (H) 34 36 (H) 52 (H)   Component     Latest Ref Rng & Units 09/18/2019  Total Bilirubin     0.0 - 1.2 mg/dL 0.3  Alkaline Phosphatase     39 - 117 IU/L 81  AST     0 - 40 IU/L 67 (H)  ALT     0 - 32 IU/L 40 (H)   The patient did have a ultrasound of the liver in November 2020 that showed:  IMPRESSION: 1. No signs of acute cholecystitis. 2. Common bile duct at upper limits of normal. Significance uncertain. Correlation with pattern laboratory abnormality to determine whether MRCP may be helpful. Given hepatic findings below further assessment of the liver with cross-sectional imaging such as CT or MRI could potentially be of benefit. 3. Increased echogenicity of hepatic parenchyma with lobulated contours raising the question of liver disease and perhaps even Cirrhosis/fibrosis.  No further imaging has been done since the ultrasound.  The patient denies any history of alcohol abuse or family history of liver disease.  She also denies any risk factors for abnormal liver enzymes or liver disease.  She does have diabetes but denies having hypercholesterolemia.  Past Medical  History:  Diagnosis Date  . BRCA negative 11/15/2012   [facility]  . Diabetes mellitus without complication ([place])   . History of chicken pox   . Hypertension     Past Surgical History:  Procedure Laterality Date  . FOOT SURGERY      Prior to Admission medications   Medication Sig Start Date End Date Taking? Authorizing Provider  Accu-Chek Softclix Lancets lancets Use to check blood sugar daily for type 2 diabetes. E11.9 12/29/18   [name], MD  amLODipine (NORVASC) 5 MG tablet TAKE 1 TABLET BY MOUTH EVERY DAY 11/09/19   [name], MD  aspirin EC 81 MG tablet Take 81 mg by mouth daily.    [provider]  betamethasone dipropionate (DIPROLENE) 0.05 % cream Apply topically 2 (two) times daily as needed. 01/16/19   [name], MD  Blood Glucose Monitoring Suppl (ACCU-CHEK AVIVA PLUS) w/Device KIT Use to check blood sugar daily for type 2 diabetes. E11.9 12/29/18   [name], MD  Cholecalciferol (VITAMIN D-3 PO) Take 1 tablet by mouth daily.    [provider]  glipiZIDE (GLUCOTROL XL) 5 MG 24 hr tablet TAKE 1 TABLET BY MOUTH EVERY DAY  07/21/19   [name], MD  glucose blood (ACCU-CHEK AVIVA PLUS) test strip Use to check blood sugar daily for type 2 diabetes. E11.9 12/29/18   [name], MD  lovastatin (MEVACOR) 20 MG tablet TAKE 1 TABLET BY MOUTH (20 MG TOTAL) AT BEDTIME 10/14/18   [name], MD  metFORMIN (GLUCOPHAGE) 1000 MG tablet TAKE 1 TABLET BY MOUTH TWICE A DAY 04/14/19   [name], MD  trandolapril (MAVIK) 4 MG tablet TAKE 1 TABLET BY MOUTH (4 MG TOTAL) DAILY 10/19/19   [name], MD  TRULICITY 1.5 BZ/1.6RC SOPN INJECT 1.5 MG INTO THE SKIN ONCE A WEEK. 11/09/19   [name], MD  verapamil (VERELAN PM) 240 MG 24 hr capsule TAKE 1 CAPSULE (240 MG TOTAL) BY MOUTH AT BEDTIME. 09/14/19   [name], MD    Family History  Problem Relation Age of Onset  . Breast cancer Mother   . Breast cancer Sister   . Cancer  Sister        renal      Social History   Tobacco Use  . Smoking status: Former Smoker    Packs/day: 1.00    Years: 4.00    Pack years: 4.00  . Smokeless tobacco: Never Used  . Tobacco comment: quit in 1991  Substance Use Topics  . Alcohol use: No  . Drug use: No    Allergies as of 11/15/2019 - Review Complete 09/18/2019  Allergen Reaction Noted  . Pioglitazone  07/17/2015    Review of Systems:    All systems reviewed and negative except where noted in HPI.   Physical Exam:  There were no vitals taken for this visit. No LMP recorded. Patient is postmenopausal. General:   Alert,  Well-developed, well-nourished, pleasant and cooperative in NAD Head:  Normocephalic and atraumatic. Eyes:  Sclera clear, no icterus.   Conjunctiva pink. Ears:  Normal auditory acuity. Neck:  Supple; no masses or thyromegaly. Lungs:  Respirations even and unlabored.  Clear throughout to auscultation.   No wheezes, crackles, or rhonchi. No acute distress. Heart:  Regular rate and rhythm; no murmurs, clicks, rubs, or gallops. Abdomen:  Normal bowel sounds.  No bruits.  Soft, non-tender and non-distended without masses, hepatosplenomegaly or hernias noted.  No guarding or rebound tenderness.  Negative Carnett sign.   Rectal:  Deferred.  Pulses:  Normal pulses noted. Extremities:  No clubbing or edema.  No cyanosis. Neurologic:  Alert and oriented x3;  grossly normal neurologically. Skin:  Intact without significant lesions or rashes.  No jaundice. Lymph Nodes:  No significant cervical adenopathy. Psych:  Alert and cooperative. Normal mood and affect.  Imaging Studies: MM 3D SCREEN BREAST BILATERAL  Result Date: 10/25/2019 CLINICAL DATA:  Screening. EXAM: DIGITAL SCREENING BILATERAL MAMMOGRAM WITH TOMO AND CAD COMPARISON:  Previous exam(s). ACR Breast Density Category b: There are scattered areas of fibroglandular density. FINDINGS: There are no findings suspicious for malignancy. Images were  processed with CAD. IMPRESSION: No mammographic evidence of malignancy. A result letter of this screening mammogram will be mailed directly to the patient. RECOMMENDATION: Screening mammogram in one year. (Code:SM-B-01Y) BI-RADS CATEGORY  1: Negative. Electronically Signed   By: Abelardo Diesel M.D.   On: 10/25/2019 13:52    Assessment and Plan:   Anna Sweeney is a 69 y.o. y/o female who comes in with abnormal liver enzymes dating back to 2018.  The patient's liver enzymes were followed with an ultrasound that showed possible fibrosis  versus cirrhosis with recommendations for further imaging.  The patient will have her lab sent off for other possible cause of abnormal liver enzymes and an alpha-fetoprotein.  She will also have a CT scan of the liver to look for any other lesions or stigmata of cirrhosis.  The patient will also be checked for her immunity to hepatitis A and hepatitis B and she will be vaccinated accordingly.  The patient will be notified of the results of her tests and has been told that weight loss can decrease the amount of fat in her liver.  The patient has been explained the plan and agrees with it.    Lucilla Lame, MD. [facility]    Note: This dictation was prepared with Dragon dictation along with smaller phrase technology. Any transcriptional errors that result from this process are unintentional.

## 2019-11-19 DIAGNOSIS — R932 Abnormal findings on diagnostic imaging of liver and biliary tract: Secondary | ICD-10-CM | POA: Diagnosis not present

## 2019-11-19 DIAGNOSIS — R7401 Elevation of levels of liver transaminase levels: Secondary | ICD-10-CM | POA: Diagnosis not present

## 2019-11-21 ENCOUNTER — Ambulatory Visit: Payer: Medicare HMO

## 2019-11-23 ENCOUNTER — Other Ambulatory Visit: Payer: Self-pay

## 2019-11-23 DIAGNOSIS — R933 Abnormal findings on diagnostic imaging of other parts of digestive tract: Secondary | ICD-10-CM

## 2019-11-23 LAB — HCV FIBROSURE
ALPHA 2-MACROGLOBULINS, QN: 332 mg/dL — ABNORMAL HIGH (ref 110–276)
ALT (SGPT) P5P: 45 IU/L — ABNORMAL HIGH (ref 0–40)
Apolipoprotein A-1: 124 mg/dL (ref 116–209)
Bilirubin, Total: 0.3 mg/dL (ref 0.0–1.2)
Fibrosis Score: 0.47 — ABNORMAL HIGH (ref 0.00–0.21)
GGT: 81 IU/L — ABNORMAL HIGH (ref 0–60)
Haptoglobin: 245 mg/dL (ref 37–355)
Necroinflammat Activity Score: 0.31 — ABNORMAL HIGH (ref 0.00–0.17)

## 2019-11-23 LAB — CERULOPLASMIN: Ceruloplasmin: 23.5 mg/dL (ref 19.0–39.0)

## 2019-11-23 LAB — FERRITIN: Ferritin: 139 ng/mL (ref 15–150)

## 2019-11-23 LAB — COMPREHENSIVE METABOLIC PANEL
ALT: 40 IU/L — ABNORMAL HIGH (ref 0–32)
AST: 58 IU/L — ABNORMAL HIGH (ref 0–40)
Albumin/Globulin Ratio: 1.6 (ref 1.2–2.2)
Albumin: 4.6 g/dL (ref 3.8–4.8)
Alkaline Phosphatase: 70 IU/L (ref 39–117)
BUN/Creatinine Ratio: 15 (ref 12–28)
BUN: 26 mg/dL (ref 8–27)
Bilirubin Total: 0.4 mg/dL (ref 0.0–1.2)
CO2: 20 mmol/L (ref 20–29)
Calcium: 9.9 mg/dL (ref 8.7–10.3)
Chloride: 98 mmol/L (ref 96–106)
Creatinine, Ser: 1.68 mg/dL — ABNORMAL HIGH (ref 0.57–1.00)
GFR calc Af Amer: 36 mL/min/{1.73_m2} — ABNORMAL LOW (ref >59)
GFR calc non Af Amer: 31 mL/min/{1.73_m2} — ABNORMAL LOW (ref >59)
Globulin, Total: 2.9 g/dL (ref 1.5–4.5)
Glucose: 241 mg/dL — ABNORMAL HIGH (ref 65–99)
Potassium: 4.8 mmol/L (ref 3.5–5.2)
Sodium: 136 mmol/L (ref 134–144)
Total Protein: 7.5 g/dL (ref 6.0–8.5)

## 2019-11-23 LAB — HEPATITIS B SURFACE ANTIGEN: Hepatitis B Surface Ag: NEGATIVE

## 2019-11-23 LAB — HEPATITIS B SURFACE ANTIBODY,QUALITATIVE: Hep B Surface Ab, Qual: NONREACTIVE

## 2019-11-23 LAB — ANA: Anti Nuclear Antibody (ANA): NEGATIVE

## 2019-11-23 LAB — IRON AND TIBC
Iron Saturation: 14 % — ABNORMAL LOW (ref 15–55)
Iron: 48 ug/dL (ref 27–139)
Total Iron Binding Capacity: 335 ug/dL (ref 250–450)
UIBC: 287 ug/dL (ref 118–369)

## 2019-11-23 LAB — AFP TUMOR MARKER: AFP, Serum, Tumor Marker: 2.6 ng/mL (ref 0.0–8.3)

## 2019-11-23 LAB — HEPATITIS A ANTIBODY, TOTAL: hep A Total Ab: POSITIVE — AB

## 2019-11-23 LAB — MITOCHONDRIAL ANTIBODIES: Mitochondrial Ab: <20 Units (ref 0.0–20.0)

## 2019-11-23 LAB — ANTI-SMOOTH MUSCLE ANTIBODY, IGG: Smooth Muscle Ab: 4 Units (ref 0–19)

## 2019-11-23 LAB — ALPHA-1-ANTITRYPSIN: A-1 Antitrypsin: 143 mg/dL (ref 101–187)

## 2019-11-26 ENCOUNTER — Ambulatory Visit: Admission: RE | Admit: 2019-11-26 | Payer: Medicare HMO | Source: Ambulatory Visit

## 2019-11-30 ENCOUNTER — Other Ambulatory Visit: Payer: Self-pay | Admitting: Family Medicine

## 2019-11-30 DIAGNOSIS — I10 Essential (primary) hypertension: Secondary | ICD-10-CM

## 2019-11-30 DIAGNOSIS — E119 Type 2 diabetes mellitus without complications: Secondary | ICD-10-CM

## 2019-11-30 DIAGNOSIS — E7800 Pure hypercholesterolemia, unspecified: Secondary | ICD-10-CM

## 2019-11-30 MED ORDER — GLIPIZIDE ER 5 MG PO TB24: 5.0000 mg | ORAL_TABLET | Freq: Every day | ORAL | 3 refills | Status: DC

## 2019-11-30 MED ORDER — METFORMIN HCL 1000 MG PO TABS: 1000.0000 mg | ORAL_TABLET | Freq: Two times a day (BID) | ORAL | 3 refills | Status: DC

## 2019-11-30 MED ORDER — AMLODIPINE BESYLATE 5 MG PO TABS: 5.0000 mg | ORAL_TABLET | Freq: Every day | ORAL | 3 refills | Status: DC

## 2019-11-30 MED ORDER — LOVASTATIN 20 MG PO TABS: ORAL_TABLET | ORAL | 3 refills | Status: DC

## 2019-11-30 MED ORDER — VERAPAMIL HCL ER 240 MG PO CP24: 240.0000 mg | ORAL_CAPSULE | Freq: Every day | ORAL | 4 refills | Status: DC

## 2019-11-30 MED ORDER — TRULICITY 1.5 MG/0.5ML ~~LOC~~ SOAJ: SUBCUTANEOUS | 4 refills | Status: DC

## 2019-11-30 MED ORDER — TRANDOLAPRIL 4 MG PO TABS: 4.0000 mg | ORAL_TABLET | Freq: Every day | ORAL | 3 refills | Status: DC

## 2019-11-30 NOTE — Telephone Encounter (Deleted)
[facility] faxed refill request for the following medications:  metFORMIN (GLUCOPHAGE) 1000 MG tablet trandolapril (MAVIK) 4 MG tablet TRULICITY 1.5 MG/0.5ML SOPN - subcutaneous pen injector Please advise.

## 2019-11-30 NOTE — Telephone Encounter (Signed)
Patient is switching pharmacies from [facility] to [facility] mail order. [employer] sent a fax requesting these prescriptions. Please verify if the quantity for the Trulicity pens is correct. Patient is scheduled for a follow up appointment 12/21/2019 at 8am.

## 2019-11-30 NOTE — Telephone Encounter (Addendum)
[facility] faxed refill request for the following medications:  verapamil (VERELAN PM) 240 MG 24 hr capsule, extended release amLODipine (NORVASC) 5 MG tablet glipiZIDE (GLUCOTROL XL) 5 MG 24 hr tablet lovastatin (MEVACOR) 20 MG tablet metFORMIN (GLUCOPHAGE) 1000 MG tablet trandolapril (MAVIK) 4 MG tablet TRULICITY 1.5 MG/0.5ML SOPN - subcutaneous pen injector  Please advise.  Thanks, [employer]

## 2019-12-06 ENCOUNTER — Ambulatory Visit
Admission: RE | Admit: 2019-12-06 | Discharge: 2019-12-06 | Disposition: A | Discharge status: Home / Self Care | Payer: Medicare HMO | Source: Ambulatory Visit | Attending: Gastroenterology | Admitting: Gastroenterology

## 2019-12-06 ENCOUNTER — Other Ambulatory Visit: Payer: Self-pay

## 2019-12-06 DIAGNOSIS — R933 Abnormal findings on diagnostic imaging of other parts of digestive tract: Secondary | ICD-10-CM

## 2019-12-06 DIAGNOSIS — N281 Cyst of kidney, acquired: Secondary | ICD-10-CM | POA: Diagnosis not present

## 2019-12-06 DIAGNOSIS — K7460 Unspecified cirrhosis of liver: Secondary | ICD-10-CM | POA: Diagnosis not present

## 2019-12-06 IMAGING — MR MR ABDOMEN WO/W CM
17 of 20 series · 43 of 48 positions shown · IV contrast (gadavist)
Comparison: [DATE] abdominal sonogram.

CLINICAL DATA: Borderline prominent common bile duct on recent
ultrasound with echogenic liver.

EXAM:
MRI ABDOMEN WITHOUT AND WITH CONTRAST
TECHNIQUE: Multiplanar multisequence MR imaging of the abdomen was performed
both before and after the administration of intravenous contrast.
CONTRAST:  7.5mL GADAVIST GADOBUTROL 1 MMOL/ML IV SOLN

[Series 3: T2 · coronal · 6.0mm · 1.19mm/px · 2 of 30 slices shown (1 of 2)]
[im 1/30]
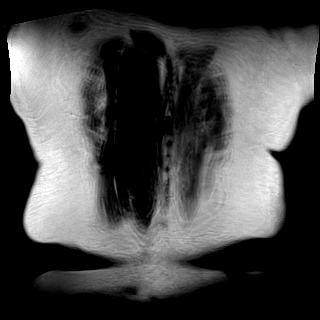
[im 30/30]
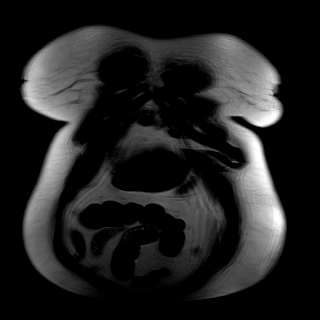

[Series 4: T2 · axial · 6.0mm · 1.19mm/px · 1 of 34 slices shown (2 of 2)]
[im 1/34]
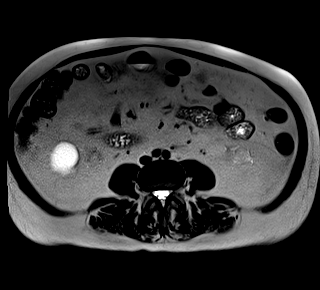

[Series 6: T2 fat-sat · axial · 6.0mm · 1.19mm/px · 1 of 36 slices shown]
[im 1/36]
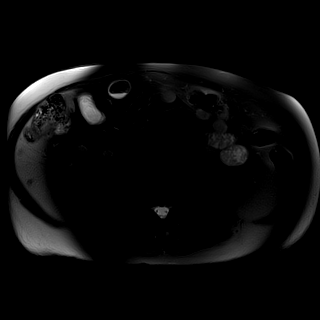

[Series 7: ax dwi_tracew · axial · 6.0mm · 1.42mm/px · z∈[-145,+107]mm · 4 of 108 slices shown]
[im 1/108]
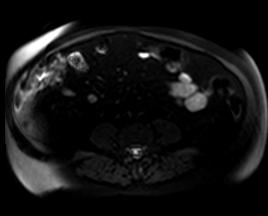
[im 36/108]
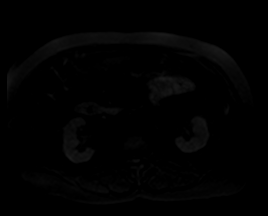
[im 72/108]
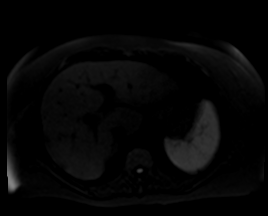
[im 108/108]
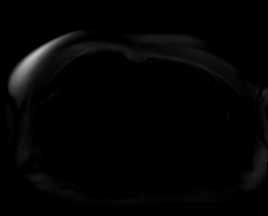

[Series 8: ax dwi_adc · axial · 6.0mm · 1.42mm/px · 1 of 36 slices shown]
[im 1/36]
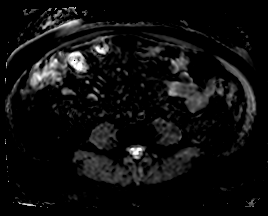

[Series 9: T1 · axial · 6.0mm · 0.74mm/px · z∈[-138,+99]mm · 3 of 68 slices shown]
[im 1/68]
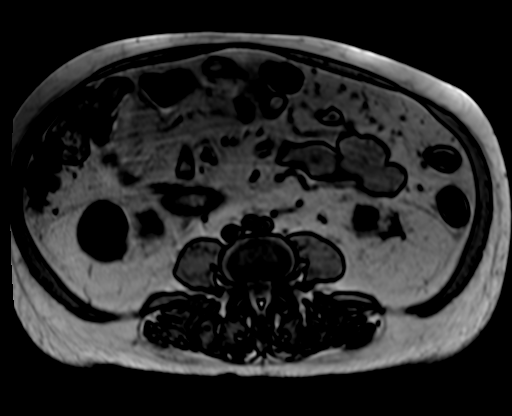
[im 34/68]
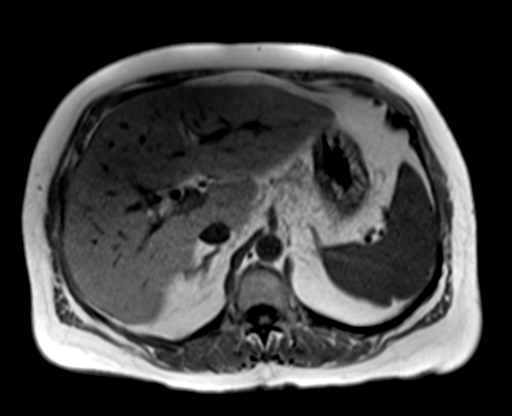
[im 68/68]
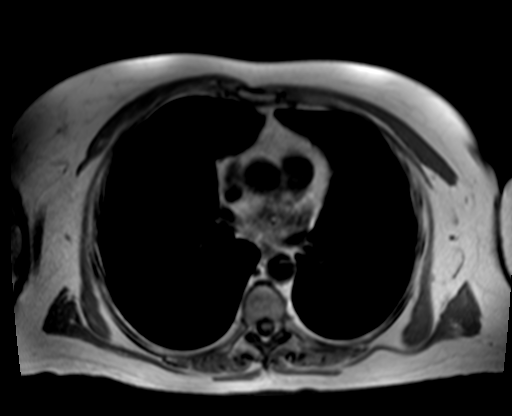

[Series 13: bSSFP · axial · 6.0mm · 0.74mm/px · 1 of 34 slices shown]
[im 1/34]
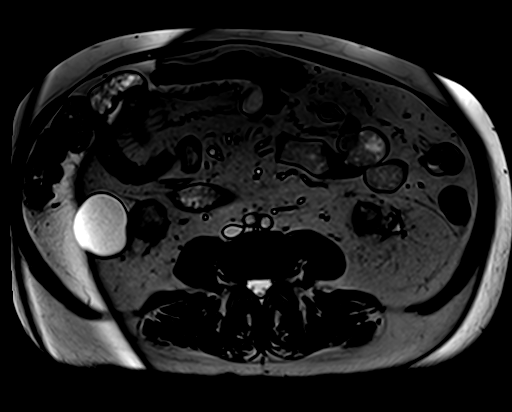

[Series 14: T1 dynamic fat-sat · axial · non-contrast · 3.3mm · 1.19mm/px · z∈[-150,+111]mm · 3 of 80 slices shown (1 of 5)]
[im 1/80]
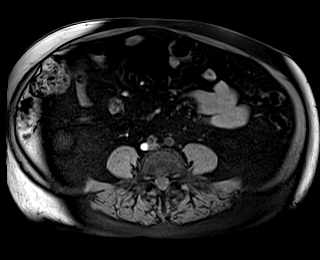
[im 40/80]
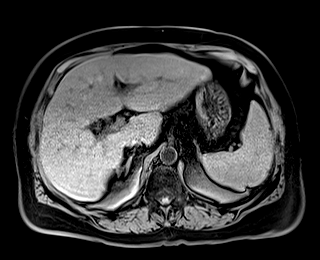
[im 80/80]
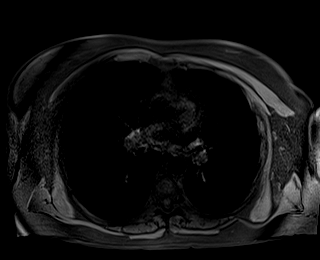

[Series 15: T1 dynamic fat-sat post-contrast · axial · 3.3mm · 1.19mm/px · z∈[-150,+111]mm · 3 of 80 slices shown (1 of 4)]
[im 1/80]
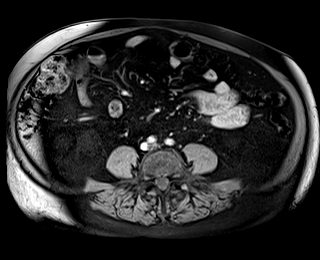
[im 40/80]
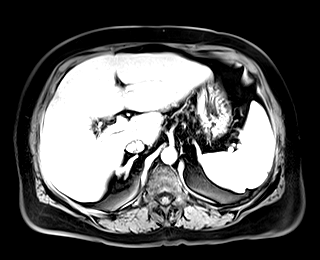
[im 80/80]
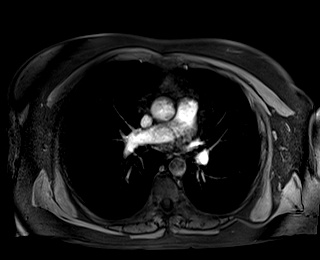

[Series 16: T1 dynamic fat-sat · axial · 3.3mm · 1.19mm/px · z∈[-150,+111]mm · 3 of 80 slices shown (2 of 5)]
[im 1/80]
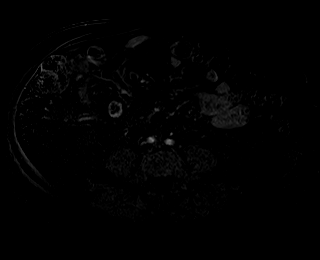
[im 40/80]
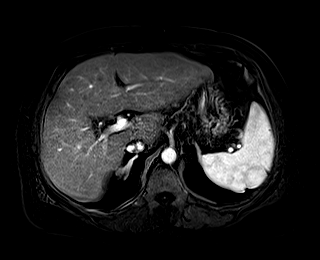
[im 80/80]
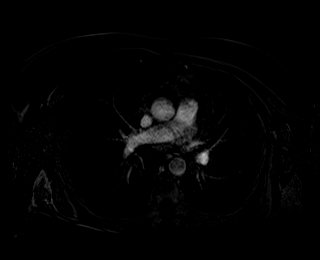

[Series 17: T1 dynamic fat-sat post-contrast · axial · 3.3mm · 1.19mm/px · z∈[-150,+111]mm · 3 of 80 slices shown (2 of 4)]
[im 1/80]
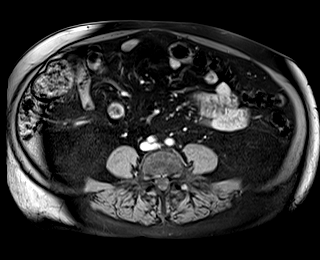
[im 40/80]
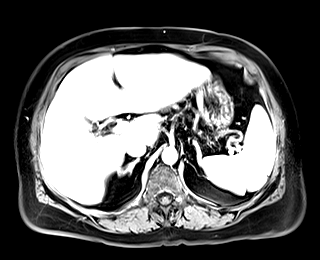
[im 80/80]
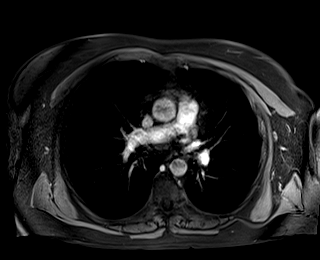

[Series 18: T1 dynamic fat-sat · axial · 3.3mm · 1.19mm/px · z∈[-150,+111]mm · 3 of 80 slices shown (3 of 5)]
[im 1/80]
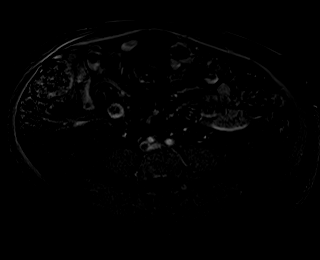
[im 40/80]
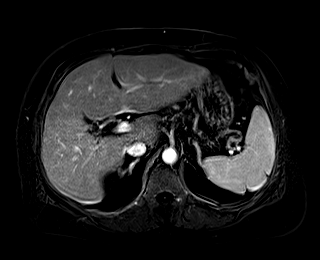
[im 80/80]
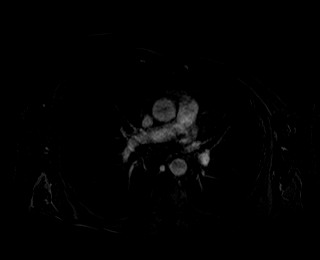

[Series 19: T1 dynamic fat-sat post-contrast · axial · 3.3mm · 1.19mm/px · z∈[-150,+111]mm · 3 of 80 slices shown (3 of 4)]
[im 1/80]
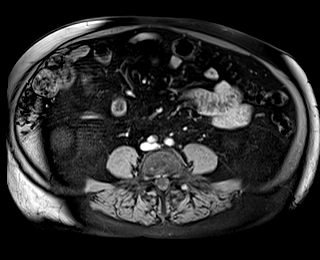
[im 40/80]
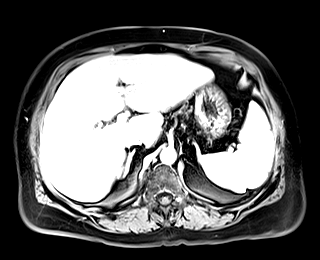
[im 80/80]
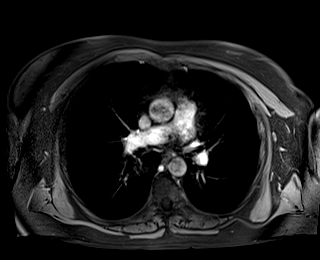

[Series 20: T1 dynamic fat-sat · axial · 3.3mm · 1.19mm/px · z∈[-150,+111]mm · 3 of 80 slices shown (4 of 5)]
[im 1/80]
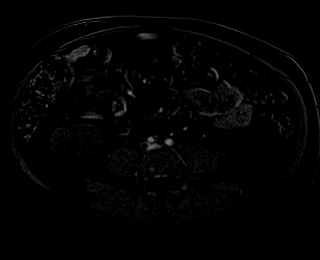
[im 40/80]
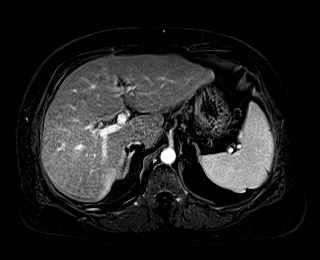
[im 80/80]
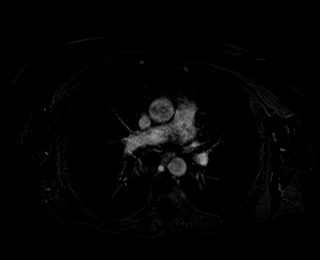

[Series 21: T1 dynamic post-contrast · coronal · 3.0mm · 1.31mm/px · 3 of 72 slices shown]
[im 1/72]
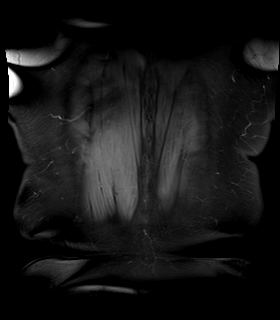
[im 36/72]
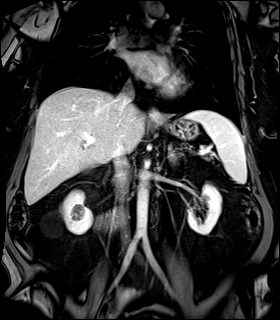
[im 72/72]
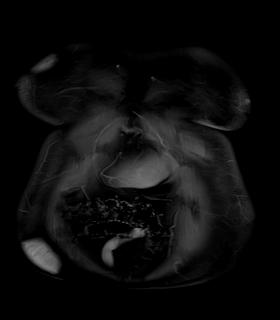

[Series 22: T1 dynamic fat-sat post-contrast · axial · 3.3mm · 1.19mm/px · z∈[-150,+111]mm · 3 of 80 slices shown (4 of 4)]
[im 1/80]
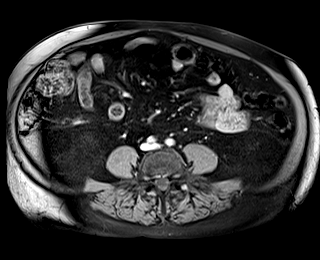
[im 40/80]
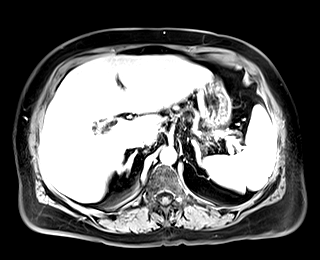
[im 80/80]
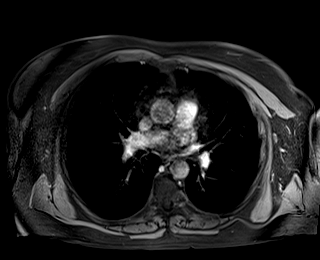

[Series 23: T1 dynamic fat-sat · axial · 3.3mm · 1.19mm/px · z∈[-150,+111]mm · 3 of 80 slices shown (5 of 5)]
[im 1/80]
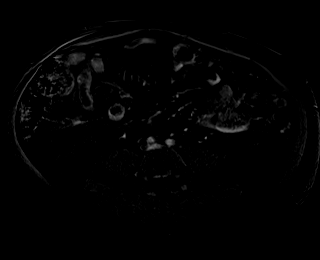
[im 40/80]
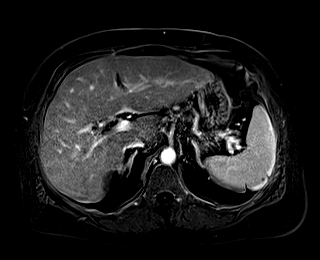
[im 80/80]
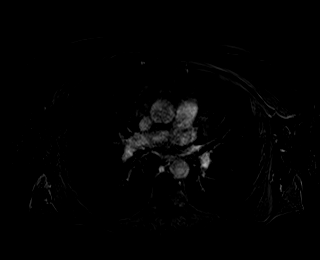

[43 of 48 positions shown; findings below may reference images not displayed]

FINDINGS: Lower chest: No acute abnormality at the lung bases.

Hepatobiliary: Diffusely mildly irregular liver surface with slight
hypertrophy of the lateral segment left liver lobe, compatible with
cirrhosis. Mild diffuse hepatic steatosis. No liver mass. Normal
gallbladder with no cholelithiasis. No biliary ductal dilatation.
Common bile duct diameter 5 mm. No choledocholithiasis. No biliary
strictures, masses or beading.

Pancreas: No pancreatic mass or duct dilation.  No pancreas divisum.

Spleen: Normal size. No mass.

Adrenals/Urinary Tract: Normal adrenals. No hydronephrosis. Simple
bilateral renal cysts, largest 5.0 cm in the lateral lower right
kidney. There is a complex 2.3 x 2.1 cm renal cyst in the anterior
lower right kidney (series 4/image 29) demonstrating several
thickened enhancing internal septations, considered Bosniak category
3. There is a 1.6 x 1.4 cm renal cyst in the interpolar right kidney
(series 4/image 27) with precontrast T1 isointensity and no
enhancement, compatible with a Bosniak category 2
hemorrhagic/proteinaceous renal cyst.

Stomach/Bowel: Normal non-distended stomach. Visualized small and
large bowel is normal caliber, with no bowel wall thickening.

Vascular/Lymphatic: Normal caliber abdominal aorta. Patent portal,
splenic, hepatic and renal veins. No evidence of renal vein tumor
thrombus. No significant varices. No pathologically enlarged lymph
nodes in the abdomen.

Other: No abdominal ascites or focal fluid collection.

Musculoskeletal: No aggressive appearing focal osseous lesions.
IMPRESSION: 1. Complex 2.3 cm Bosniak category 3 renal cyst in the anterior
lower right kidney with several thickened enhancing internal
septations, suspicious for cystic renal cell carcinoma. Urology
consultation advised. No renal vein tumor thrombus. No findings of
metastatic disease in the abdomen.
2. Cirrhosis. Mild diffuse hepatic steatosis. No liver masses.
3. No secondary findings of portal hypertension.
4. Normal bile ducts.

These results will be called to the ordering clinician or
representative by the Radiologist Assistant, and communication
documented in the PACS or [REDACTED].

## 2019-12-06 MED ORDER — GADOBUTROL 1 MMOL/ML IV SOLN
7.5000 mL | Freq: Once | INTRAVENOUS | Status: AC | PRN
  Administered 2019-12-06: 7.5 mL via INTRAVENOUS

## 2019-12-11 ENCOUNTER — Other Ambulatory Visit: Payer: Self-pay | Admitting: Family Medicine

## 2019-12-11 ENCOUNTER — Telehealth: Payer: Self-pay

## 2019-12-11 DIAGNOSIS — R93421 Abnormal radiologic findings on diagnostic imaging of right kidney: Secondary | ICD-10-CM

## 2019-12-11 NOTE — Telephone Encounter (Signed)
Attempted to contact patient, no answer or voicemail. Okay for PEC to advise patient.  

## 2019-12-11 NOTE — Telephone Encounter (Signed)
Pt notified of MRI results

## 2019-12-11 NOTE — Telephone Encounter (Signed)
-----   Message from Malva Limes, MD sent at 12/11/2019  7:33 AM EDT ----- MR ordered by Dr. Servando Snare shows some unusual thickened segment of her right kidney. Needs further evaluation by urology. Please advise and submit referral

## 2019-12-11 NOTE — Telephone Encounter (Signed)
LVM for pt to return my call regarding MRI result.

## 2019-12-11 NOTE — Telephone Encounter (Signed)
-----   Message from Anna Minium, MD sent at 12/10/2019  1:05 PM EDT ----- Let the patient know that the liver MRI did show what looks like cirrhosis with some fatty liver but no cancers or masses were seen.  There is also no stones blocking the bile duct.  There was something seen on her kidney and I am forwarding the MRI to Dr. Sherrie Mustache to address that.

## 2019-12-12 NOTE — Telephone Encounter (Signed)
Message from Dr. Sherrie Mustache read to pt, verbalizes understanding; agreeable to referral.

## 2019-12-12 NOTE — Telephone Encounter (Signed)
Attempted to call pt.  Left vm to call office for recommendations from Dr. Sherrie Mustache.

## 2019-12-20 NOTE — Progress Notes (Signed)
Established patient visit   Patient: Anna Sweeney   DOB: 11/26/1950   70 y.o. Female  MRN: [id number] Visit Date: 12/21/2019  Today's healthcare provider: Lelon Huh, MD   Chief Complaint  Patient presents with  . Diabetes  . Hypertension  . Hyperlipidemia   I,Latasha Walston,acting as a scribe for Lelon Huh, MD.,have documented all relevant documentation on the behalf of Lelon Huh, MD,as directed by  Lelon Huh, MD while in the presence of Lelon Huh, MD.  Subjective    HPI Diabetes Mellitus Type II, Follow-up  Lab Results  Component Value Date   HGBA1C 7.4 (A) 12/21/2019   HGBA1C 10.5 (H) 09/18/2019   HGBA1C 7.6 (H) 06/15/2019   Wt Readings from Last 3 Encounters:  12/21/19 163 lb 3.2 oz (74 kg)  11/15/19 169 lb 6.4 oz (76.8 kg)  09/18/19 172 lb (78 kg)   Last seen for diabetes 3 months ago.  Management since then includes restarted Trulicity . She reports good compliance with treatment. She is not having side effects.  Symptoms: No fatigue No foot ulcerations  No appetite changes No nausea  No paresthesia of the feet  No polydipsia  No polyuria No visual disturbances   No vomiting     Home blood sugar records: fasting range: patient has not been checking  Episodes of hypoglycemia? No    Current insulin regiment: None Most Recent Eye Exam: 01/16/2018 Current exercise: none Current diet habits: in general, a "healthy" diet    Pertinent Labs: Lab Results  Component Value Date   CHOL 176 06/15/2019   HDL 36 (L) 06/15/2019   LDLCALC 99 06/15/2019   TRIG 238 (H) 06/15/2019   CHOLHDL 4.9 (H) 06/15/2019   Lab Results  Component Value Date   NA 136 11/19/2019   K 4.8 11/19/2019   CREATININE 1.68 (H) 11/19/2019   GFRNONAA 31 (L) 11/19/2019   GFRAA 36 (L) 11/19/2019   GLUCOSE 241 (H) 11/19/2019     ---------------------------------------------------------------------------------------------------  Since last visit she has also been  to see Dr. Allen Norris for fatty liver disease and possible early cirrhosis and was advised to get hepatitis B vaccine. She has not yet received the Covid vaccine     Medications: Outpatient Medications Prior to Visit  Medication Sig  . Accu-Chek Softclix Lancets lancets Use to check blood sugar daily for type 2 diabetes. E11.9  . amLODipine (NORVASC) 5 MG tablet Take 1 tablet (5 mg total) by mouth daily.  Marland Kitchen aspirin EC 81 MG tablet Take 81 mg by mouth daily.  . betamethasone dipropionate (DIPROLENE) 0.05 % cream Apply topically 2 (two) times daily as needed.  . Blood Glucose Monitoring Suppl (ACCU-CHEK AVIVA PLUS) w/Device KIT Use to check blood sugar daily for type 2 diabetes. E11.9  . Cholecalciferol (VITAMIN D-3 PO) Take 1 tablet by mouth daily.  . Dulaglutide (TRULICITY) 1.5 DU/2.0UR SOPN INJECT 1.5 MG INTO THE SKIN ONCE A WEEK.  Marland Kitchen glipiZIDE (GLUCOTROL XL) 5 MG 24 hr tablet Take 1 tablet (5 mg total) by mouth daily.  Marland Kitchen glucose blood (ACCU-CHEK AVIVA PLUS) test strip Use to check blood sugar daily for type 2 diabetes. E11.9  . lovastatin (MEVACOR) 20 MG tablet TAKE 1 TABLET BY MOUTH (20 MG TOTAL) AT BEDTIME  . metFORMIN (GLUCOPHAGE) 1000 MG tablet Take 1 tablet (1,000 mg total) by mouth 2 (two) times daily.  . trandolapril (MAVIK) 4 MG tablet Take 1 tablet (4 mg total) by mouth daily.  . verapamil (VERELAN PM)  240 MG 24 hr capsule Take 1 capsule (240 mg total) by mouth at bedtime.   No facility-administered medications prior to visit.    Review of Systems  Constitutional: Negative.   Respiratory: Negative.   Cardiovascular: Negative.   Musculoskeletal: Negative.       Objective    BP 137/67 (BP Location: Right Arm, Patient Position: Sitting, Cuff Size: Normal)   Pulse 83   Temp (!) 96.9 F (36.1 C) (Temporal)   Wt 163 lb 3.2 oz (74 kg)   BMI 26.34 kg/m    Physical Exam   General appearance:  Overweight female, cooperative and in no acute distress Head: Normocephalic,  without obvious abnormality, atraumatic Respiratory: Respirations even and unlabored, normal respiratory rate Extremities: All extremities are intact.  Skin: Skin color, texture, turgor normal. No rashes seen  Psych: Appropriate mood and affect. Neurologic: Mental status: Alert, oriented to person, place, and time, thought content appropriate.  Results for orders placed or performed in visit on 12/21/19  POCT HgB A1C  Result Value Ref Range   Hemoglobin A1C 7.4 (A) 4.0 - 5.6 %   Est. average glucose Bld gHb Est-mCnc 166     Assessment & Plan     1. Type 2 diabetes mellitus without complication, without long-term current use of insulin (HCC) Much better with addition of Trulicity. Continue current medications.  Follow up 4 months. Consider reducing glipizide if a1c continues to improve.   2. Colon cancer screening  - Cologuard  3. Hepatic steatosis Counseled she needs hepatitis B vaccine, but she should get covid vaccines first  4. Hypertension Well controlled. Continue current medications.      Return in about 4 months (around 04/22/2020).      The entirety of the information documented in the History of Present Illness, Review of Systems and Physical Exam were personally obtained by me. Portions of this information were initially documented by the CMA and reviewed by me for thoroughness and accuracy.      Lelon Huh, MD  [facility] [phone] (phone) [phone] (fax)  [place]

## 2019-12-21 ENCOUNTER — Ambulatory Visit: Payer: Medicare HMO | Admitting: Urology

## 2019-12-21 ENCOUNTER — Other Ambulatory Visit: Payer: Self-pay

## 2019-12-21 ENCOUNTER — Ambulatory Visit (INDEPENDENT_AMBULATORY_CARE_PROVIDER_SITE_OTHER): Payer: Medicare HMO | Admitting: Family Medicine

## 2019-12-21 ENCOUNTER — Encounter: Payer: Self-pay | Admitting: Urology

## 2019-12-21 ENCOUNTER — Encounter: Payer: Self-pay | Admitting: Family Medicine

## 2019-12-21 VITALS — BP 129/71 | HR 101 | Ht 66.0 in | Wt 162.3 lb

## 2019-12-21 VITALS — BP 137/67 | HR 83 | Temp 96.9°F | Wt 163.2 lb

## 2019-12-21 DIAGNOSIS — Z1211 Encounter for screening for malignant neoplasm of colon: Secondary | ICD-10-CM

## 2019-12-21 DIAGNOSIS — I10 Essential (primary) hypertension: Secondary | ICD-10-CM

## 2019-12-21 DIAGNOSIS — N281 Cyst of kidney, acquired: Secondary | ICD-10-CM

## 2019-12-21 DIAGNOSIS — K760 Fatty (change of) liver, not elsewhere classified: Secondary | ICD-10-CM | POA: Diagnosis not present

## 2019-12-21 DIAGNOSIS — E119 Type 2 diabetes mellitus without complications: Secondary | ICD-10-CM | POA: Diagnosis not present

## 2019-12-21 LAB — POCT GLYCOSYLATED HEMOGLOBIN (HGB A1C)
Est. average glucose Bld gHb Est-mCnc: 166
Hemoglobin A1C: 7.4 % — AB (ref 4.0–5.6)

## 2019-12-21 NOTE — Patient Instructions (Signed)
.   Please review the attached list of medications and notify my office if there are any errors.   . Please bring all of your medications to every appointment so we can make sure that our medication list is the same as yours.   . Covid-19 vaccines: The Covid vaccines have been given to hundreds of millions of people and found to be very effective and are as safe as any other vaccine.  The [employer] vaccine has been associated with very rare dangerous blood clots, but only in adult women under the age of 60.  The risk of dying from Covid infections is much higher than having a serious reaction to the vaccine.  I strongly recommend getting fully vaccinated against Covid-19.  I recommend that adult women under 60 get fully vaccinated, but the Moderna and Pfizer vaccines may be safer for those women than the Johnson [employer] Johnson vaccine.   

## 2019-12-21 NOTE — Progress Notes (Signed)
12/21/2019 1:08 PM   Anna Sweeney 05/07/1951 [id number]  Referring provider: [name], MD [address] [place],  [place] [address]  Chief Complaint  Patient presents with  . Establish Care    HPI: Anna Sweeney is a 69 y.o. female seen at the request of Dr. Caryn Section for evaluation of right renal abnormality seen on MRI.  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 BRCA negative 11/15/2012   [facility]  . Diabetes mellitus without complication ([place])   . History of chicken pox   . Hypertension     Surgical History: Past Surgical History:  Procedure Laterality Date  . FOOT SURGERY      Home Medications:  Allergies as of 12/21/2019      Reactions   Pioglitazone    Dizziness      Medication List       Accurate as of Dec 21, 2019  1:08 PM. If you have any questions, ask your nurse or doctor.        Accu-Chek Aviva Plus w/Device Kit Use to check blood sugar daily for type 2 diabetes. E11.9   Accu-Chek Softclix Lancets lancets Use to check blood sugar daily for type 2 diabetes. E11.9   amLODipine 5 MG tablet Commonly known as: NORVASC Take 1 tablet (5 mg total) by mouth daily.   aspirin EC 81 MG tablet Take 81 mg by mouth daily.   betamethasone dipropionate 0.05 % cream Apply topically 2 (two) times daily as needed.   glipiZIDE 5 MG 24 hr tablet Commonly known as: GLUCOTROL XL Take 1 tablet (5 mg total) by mouth daily.   glucose blood test strip Commonly known as: Accu-Chek Aviva Plus Use to check blood sugar  daily for type 2 diabetes. E11.9   lovastatin 20 MG tablet Commonly known as: MEVACOR TAKE 1 TABLET BY MOUTH (20 MG TOTAL) AT BEDTIME   metFORMIN 1000 MG tablet Commonly known as: GLUCOPHAGE Take 1 tablet (1,000 mg total) by mouth 2 (two) times daily.   trandolapril 4 MG tablet Commonly known as: MAVIK Take 1 tablet (4 mg total) by mouth daily.   Trulicity 1.5 IR/4.4RX Sopn Generic drug: Dulaglutide INJECT 1.5 MG INTO THE SKIN ONCE A WEEK.   verapamil 240 MG 24 hr capsule Commonly known as: VERELAN PM Take 1 capsule (240 mg total) by mouth at bedtime.   VITAMIN D-3 PO Take 1 tablet by mouth daily.       Allergies:  Allergies  Allergen Reactions  . Pioglitazone     Dizziness    Family History: Family History  Problem Relation Age of Onset  . Breast cancer Mother   . Breast cancer Sister   . Cancer Sister        renal     Social History:  reports that she has quit smoking. She has a 4.00 pack-year smoking history. She has never used smokeless tobacco. She reports that she does not drink alcohol or use drugs.   Physical Exam: BP 129/71 (BP Location: Left Arm, Patient Position: Sitting, Cuff Size: Normal)   Pulse (!) 101   Ht 5'  6" (1.676 m)   Wt 162 lb 4.8 oz (73.6 kg)   BMI 26.20 kg/m   Constitutional:  Alert and oriented, No acute distress. HEENT: [place] AT, moist mucus membranes.  Trachea midline, no masses. Cardiovascular: No clubbing, cyanosis, or edema. Respiratory: Normal respiratory effort, no increased work of breathing. Skin: No rashes, bruises or suspicious lesions. Neurologic: Grossly intact, no focal deficits, moving all 4 extremities. Psychiatric: Normal mood and affect.   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  We discussed the indeterminate nature of Bosniak 3 cysts and possibility of malignancy and ~55% of the cases.  Options were discussed of partial nephrectomy versus surveillance.  She  would like to think over these options but is leaning towards surveillance for least 4 months. We will tentatively schedule a follow-up MRI in 4 months.  Abbie Sons, [place] [address], [place], [place] [address] [phone]

## 2019-12-21 NOTE — Progress Notes (Signed)
I,Roshena L Chambers,acting as a scribe for Lelon Huh, MD.,have documented all relevant documentation on the behalf of Lelon Huh, MD,as directed by  Lelon Huh, MD while in the presence of Lelon Huh, MD.  Established patient visit   Patient: Anna Sweeney   DOB: [birth year]   69 y.o. Female  MRN: [id number] Visit Date: 04/22/2020  Today's healthcare provider: Lelon Huh, MD   Chief Complaint  Patient presents with  . Diabetes  . Hyperlipidemia  . Hypertension  . Rash   Subjective    HPI Diabetes Mellitus Type II, follow-up  Lab Results  Component Value Date   HGBA1C 7.4 (A) 12/21/2019   HGBA1C 10.5 (H) 09/18/2019   HGBA1C 7.6 (H) 06/15/2019   Last seen for diabetes 4 months ago.  Management since then includes continuing the same treatment. She had previously been started back on Trulicity which she was tolerating well and had greatly improved blood sugar control.  We also discussed reducing glipizide if A1c continues to drop She reports good compliance with treatment. She is not having side effects.   Home blood sugar records: fasting range: 148  Episodes of hypoglycemia? No    Current insulin regiment: none Most Recent Eye Exam: not UTD; has appt on 05/21/2020  --------------------------------------------------------------------------------------------------- Hypertension, follow-up  BP Readings from Last 3 Encounters:  04/22/20 123/74  12/21/19 129/71  12/21/19 137/67   Wt Readings from Last 3 Encounters:  04/22/20 152 lb (68.9 kg)  12/21/19 162 lb 4.8 oz (73.6 kg)  12/21/19 163 lb 3.2 oz (74 kg)     She was last seen for hypertension 4 months ago.  BP at that visit was 137/67. Management since that visit includes continue the same medications.  She reports good compliance with treatment. She is not having side effects.  She is not exercising. She is not adherent to low salt diet.   Outside blood pressures are not checked.  She does  not smoke.  Use of agents associated with hypertension: NSAIDS.   --------------------------------------------------------------------------------------------------- Lipid/Cholesterol, follow-up  Last Lipid Panel: Lab Results  Component Value Date   CHOL 176 06/15/2019   LDLCALC 99 06/15/2019   HDL 36 (L) 06/15/2019   TRIG 238 (H) 06/15/2019    She was last seen for this 7 months ago.  Management since that visit includes continue same medications.  She reports good compliance with treatment. She is not having side effects.   Symptoms: No appetite changes No foot ulcerations  No chest pain No chest pressure/discomfort  No dyspnea No orthopnea  No fatigue No lower extremity edema  No palpitations No paroxysmal nocturnal dyspnea  No nausea No numbness or tingling of extremity  No polydipsia No polyuria  No speech difficulty No syncope   She is following a Regular diet. Current exercise: none  Last metabolic panel Lab Results  Component Value Date   GLUCOSE 241 (H) 11/19/2019   NA 136 11/19/2019   K 4.8 11/19/2019   BUN 26 11/19/2019   CREATININE 1.68 (H) 11/19/2019   GFRNONAA 31 (L) 11/19/2019   GFRAA 36 (L) 11/19/2019   CALCIUM 9.9 11/19/2019   AST 58 (H) 11/19/2019   ALT 40 (H) 11/19/2019   The 10-year ASCVD risk score Mikey Bussing DC Jr., et al., 2013) is: 18.7%  ---------------------------------------------------------------------------------------------------  Rash: Patient complains of a red itchy rash on her left lower leg. Rash appeared 2 weeks ago and has spread to a large part of her lower leg.  She has tried using Cereva  lotion. She had a family member that have her triamcinolone cream to use which helped with itching, but didn't clear the rash up.      Medications: Outpatient Medications Prior to Visit  Medication Sig  . Accu-Chek Softclix Lancets lancets Use to check blood sugar daily for type 2 diabetes. E11.9  . amLODipine (NORVASC) 5 MG tablet Take  1 tablet (5 mg total) by mouth daily.  Marland Kitchen aspirin EC 81 MG tablet Take 81 mg by mouth daily.  . betamethasone dipropionate (DIPROLENE) 0.05 % cream Apply topically 2 (two) times daily as needed.  . Blood Glucose Monitoring Suppl (ACCU-CHEK AVIVA PLUS) w/Device KIT Use to check blood sugar daily for type 2 diabetes. E11.9  . Cholecalciferol (VITAMIN D-3 PO) Take 1 tablet by mouth daily.  . Dulaglutide (TRULICITY) 1.5 JQ/7.3AL SOPN INJECT 1.5 MG INTO THE SKIN ONCE A WEEK.  Marland Kitchen glipiZIDE (GLUCOTROL XL) 5 MG 24 hr tablet Take 1 tablet (5 mg total) by mouth daily.  Marland Kitchen glucose blood (ACCU-CHEK AVIVA PLUS) test strip Use to check blood sugar daily for type 2 diabetes. E11.9  . lovastatin (MEVACOR) 20 MG tablet TAKE 1 TABLET BY MOUTH (20 MG TOTAL) AT BEDTIME  . metFORMIN (GLUCOPHAGE) 1000 MG tablet Take 1 tablet (1,000 mg total) by mouth 2 (two) times daily.  . trandolapril (MAVIK) 4 MG tablet Take 1 tablet (4 mg total) by mouth daily.  . verapamil (VERELAN PM) 240 MG 24 hr capsule TAKE 1 CAPSULE (240 MG TOTAL) BY MOUTH AT BEDTIME.   No facility-administered medications prior to visit.       Objective    BP 123/74 (BP Location: Left Arm, Patient Position: Sitting, Cuff Size: Normal)   Pulse 91   Temp 98.4 F (36.9 C) (Oral)   Resp 16   Wt 152 lb (68.9 kg)   BMI 24.53 kg/m    Physical Exam   General: Appearance:    Well developed, well nourished female in no acute distress  Eyes:    PERRL, conjunctiva/corneas clear, EOM's intact       Lungs:     Clear to auscultation bilaterally, respirations unlabored  Heart:    Normal heart rate. Normal rhythm. No murmurs, rubs, or gallops.   MS:   All extremities are intact.   Neurologic:   Awake, alert, oriented x 3. No apparent focal neurological           defect.          Results for orders placed or performed in visit on 04/22/20  POCT HgB A1C  Result Value Ref Range   Hemoglobin A1C 6.0 (A) 4.0 - 5.6 %   Est. average glucose Bld gHb Est-mCnc  126     Assessment & Plan     1. Type 2 diabetes mellitus without complication, without long-term current use of insulin (HCC) Very well controlled since starting Trulicity. Will stop glipizide, but advised to restart at 1/2 table if see sugars over 180.   2. Tinea corporis (suspected) vs eczema vs psoriasis  - ketoconazole (NIZORAL) 2 % cream; Apply 1 application topically daily.  Dispense: 15 g; Refill: 0 - triamcinolone cream (KENALOG) 0.5 %; Apply 1 application topically 2 (two) times daily.  Dispense: 30 g; Refill: 0 Call if symptoms change or if not rapidly improving.     3. Need for influenza vaccination  She has not yet had Covid vaccine. She agreed to return to get both the Covid and flu vaccines.   4. Colon  cancer screening She declined Cologuard test at this time. Will readdress at follow up.   Refill - glucose blood (ACCU-CHEK AVIVA PLUS) test strip; Use to check blood sugar daily for type 2 diabetes. E11.9  Dispense: 100 each; Refill: 4   Follow up CPE in 5-6 months.       The entirety of the information documented in the History of Present Illness, Review of Systems and Physical Exam were personally obtained by me. Portions of this information were initially documented by the CMA and reviewed by me for thoroughness and accuracy.      Lelon Huh, MD  [facility] [phone] (phone) [phone] (fax)  [place]

## 2019-12-26 ENCOUNTER — Telehealth: Payer: Self-pay

## 2019-12-26 NOTE — Telephone Encounter (Signed)
Pt notified of results. Pt stated she has the cologuard test that she needs to do. She will get that sent out soon.

## 2019-12-26 NOTE — Telephone Encounter (Signed)
-----   Message from Midge Minium, MD sent at 12/12/2019 10:41 AM EDT ----- Let the patient know that her blood tests show that she is immune to hepatitis A but needs a vaccination for hepatitis B.  The rest of her labs showed her liver enzymes still elevated.  Her iron is low and if she has not had a colonoscopy recently she should be set up for a colonoscopy.

## 2020-01-08 ENCOUNTER — Other Ambulatory Visit: Payer: Self-pay | Admitting: Family Medicine

## 2020-01-17 ENCOUNTER — Other Ambulatory Visit: Payer: Self-pay | Admitting: Family Medicine

## 2020-01-17 DIAGNOSIS — E7800 Pure hypercholesterolemia, unspecified: Secondary | ICD-10-CM

## 2020-01-21 ENCOUNTER — Other Ambulatory Visit: Payer: Self-pay | Admitting: Family Medicine

## 2020-01-21 DIAGNOSIS — I10 Essential (primary) hypertension: Secondary | ICD-10-CM

## 2020-01-21 MED ORDER — AMLODIPINE BESYLATE 5 MG PO TABS: 5.0000 mg | ORAL_TABLET | Freq: Every day | ORAL | 3 refills | Status: DC

## 2020-01-21 NOTE — Telephone Encounter (Signed)
amLODipine (NORVASC) 5 MG tablet [    Patient requesting refill. She states pharmacy is needing new RX.   Pharmacy:  [facility] - [place], [place] - [address] Phone:  [phone]  Fax:  [phone]

## 2020-01-21 NOTE — Telephone Encounter (Signed)
Requested medication (s) are due for refill today: No filled 11/30/19  Requested medication (s) are on the active medication list:yes  Last refill: 11/30/19  Future visit scheduled: yes  Notes to clinic:  Spoke with [facility] pharmacy  they need clarification. They are questioning because patient is on Amlodipine and Verapamil. I did verify that patient has both medications listed on her med list. Please advise humanna    Requested Prescriptions  Pending Prescriptions Disp Refills   amLODipine (NORVASC) 5 MG tablet 90 tablet 3    Sig: Take 1 tablet (5 mg total) by mouth daily.      Cardiovascular:  Calcium Channel Blockers Passed - 01/21/2020 11:19 AM      Passed - Last BP in normal range    BP Readings from Last 1 Encounters:  12/21/19 129/71          Passed - Valid encounter within last 6 months    Recent Outpatient Visits           1 month ago Type 2 diabetes mellitus without complication, without long-term current use of insulin [facility])   [facility] Malva Limes, MD   4 months ago Annual physical exam   [facility] Malva Limes, MD   7 months ago Essential hypertension   [facility] Malva Limes, MD   1 year ago Type 2 diabetes mellitus without complication, without long-term current use of insulin [facility])   [facility] Malva Limes, MD   1 year ago Annual physical exam   [facility] Malva Limes, MD       Future Appointments             In 2 months Stoioff, Verna Czech, MD [facility]   In 3 months Fisher, Demetrios Isaacs, MD [facility]

## 2020-02-22 ENCOUNTER — Telehealth: Payer: Self-pay

## 2020-02-22 NOTE — Telephone Encounter (Signed)
Is it okay to give pt samples if available?  Thanks,   -Anna Sweeney   

## 2020-02-22 NOTE — Telephone Encounter (Signed)
She can have two of the 1.5mg  pens if we have any

## 2020-02-22 NOTE — Telephone Encounter (Signed)
Patient was advised we do have samples available.

## 2020-02-22 NOTE — Telephone Encounter (Signed)
Copied from CRM [id number]. Topic: General - Other >> Feb 22, 2020  9:04 AM Dalphine Handing A wrote: Patient wants to know if the office has any trulicity samples available today. Please advise

## 2020-02-28 DIAGNOSIS — M5136 Other intervertebral disc degeneration, lumbar region: Secondary | ICD-10-CM | POA: Diagnosis not present

## 2020-03-20 ENCOUNTER — Telehealth: Payer: Self-pay

## 2020-03-20 NOTE — Telephone Encounter (Signed)
Copied from CRM [id number]. Topic: General - Inquiry >> Mar 20, 2020  8:57 AM Anna Sweeney wrote: Reason for CRM: Pt is waiting on the processing of her assistance approval with her medications / pt asked if she can get some samples of Trulicity until the process is complete/ Pt states she needs about three weeks worth/ please advise

## 2020-03-20 NOTE — Telephone Encounter (Signed)
Patient advised samples are in the refrigerator labeled with her name and ready for pick up.

## 2020-03-20 NOTE — Telephone Encounter (Signed)
She can have two pens if we have them

## 2020-03-27 DIAGNOSIS — M5136 Other intervertebral disc degeneration, lumbar region: Secondary | ICD-10-CM | POA: Diagnosis not present

## 2020-03-31 ENCOUNTER — Other Ambulatory Visit: Payer: Self-pay | Admitting: Family Medicine

## 2020-04-09 ENCOUNTER — Telehealth: Payer: Self-pay

## 2020-04-09 NOTE — Telephone Encounter (Signed)
Copied from CRM [id number]. Topic: Referral - Request for Referral >> Apr 09, 2020 12:45 PM Adrian Prince D wrote: Has patient seen PCP for this complaint? No. *If NO, is insurance requiring patient see PCP for this issue before PCP can refer them? Referral for which specialty: Dermatologist Preferred provider/office: Dr. Theodis Aguas choice Reason for referral: Some issues with skin on her leg that want go away

## 2020-04-09 NOTE — Telephone Encounter (Signed)
Needs office visit. Patient advised. Will discuss referral at upcoming follow up appt 04/22/2020.

## 2020-04-10 ENCOUNTER — Ambulatory Visit: Payer: Self-pay | Admitting: Urology

## 2020-04-15 ENCOUNTER — Ambulatory Visit
Admission: RE | Admit: 2020-04-15 | Discharge: 2020-04-15 | Disposition: A | Discharge status: Home / Self Care | Payer: Medicare HMO | Source: Ambulatory Visit | Attending: Urology | Admitting: Urology

## 2020-04-15 ENCOUNTER — Other Ambulatory Visit: Payer: Self-pay

## 2020-04-15 DIAGNOSIS — I700 Atherosclerosis of aorta: Secondary | ICD-10-CM | POA: Diagnosis not present

## 2020-04-15 DIAGNOSIS — N2889 Other specified disorders of kidney and ureter: Secondary | ICD-10-CM | POA: Diagnosis not present

## 2020-04-15 DIAGNOSIS — K7460 Unspecified cirrhosis of liver: Secondary | ICD-10-CM | POA: Diagnosis not present

## 2020-04-15 DIAGNOSIS — N281 Cyst of kidney, acquired: Secondary | ICD-10-CM | POA: Insufficient documentation

## 2020-04-15 IMAGING — MR MR ABDOMEN WO/W CM
18 series · 48 of 48 positions shown · IV contrast (7ml Gadavist)
Comparison: [DATE]

CLINICAL DATA: Surveillance of renal cyst.  Asymptomatic.

EXAM:
MRI ABDOMEN WITHOUT AND WITH CONTRAST
TECHNIQUE: Multiplanar multisequence MR imaging of the abdomen was performed
both before and after the administration of intravenous contrast.
CONTRAST:  7mL GADAVIST GADOBUTROL 1 MMOL/ML IV SOLN

[Series 2: cor haste · coronal · 6.0mm · 1.19mm/px · 2 of 32 slices shown]
[im 1/32]
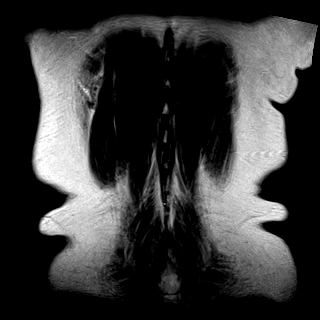
[im 32/32]
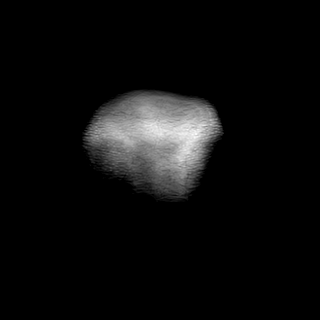

[Series 5: T2 fat-sat · axial · 6.0mm · 1.19mm/px · z∈[-88,+135]mm · 2 of 32 slices shown]
[im 1/32]
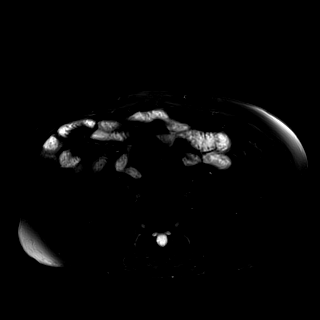
[im 32/32]
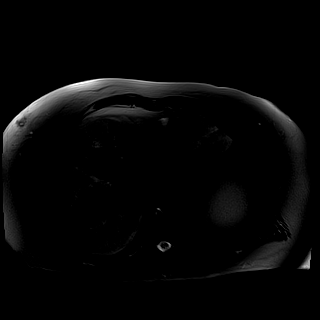

[Series 6: DWI · axial · 6.0mm · 1.42mm/px · z∈[-88,+135]mm · 5 of 96 slices shown (1 of 2)]
[im 1/96]
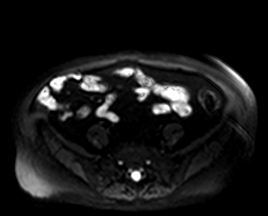
[im 24/96]
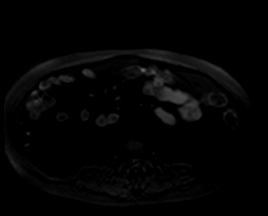
[im 48/96]
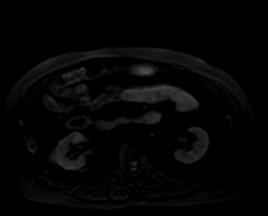
[im 72/96]
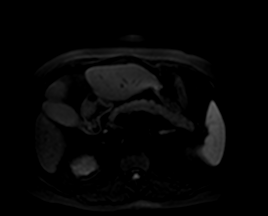
[im 96/96]
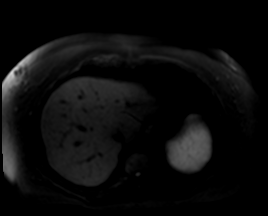

[Series 7: DWI · axial · 6.0mm · 1.42mm/px · 1 of 32 slices shown (2 of 2)]
[im 1/32]
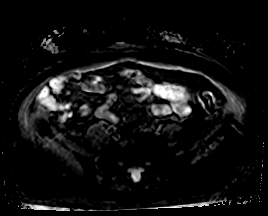

[Series 8: ax in & · axial · 3.0mm · 1.19mm/px · z∈[-83,+130]mm · 3 of 72 slices shown]
[im 1/72]
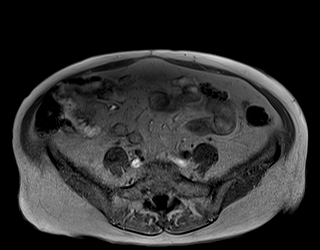
[im 36/72]
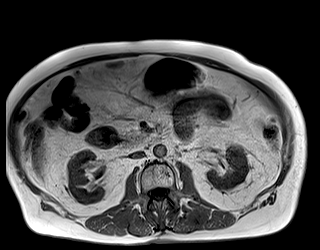
[im 72/72]
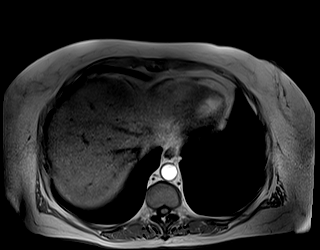

[Series 9: bSSFP · axial · 6.0mm · 0.74mm/px · 1 of 32 slices shown]
[im 1/32]
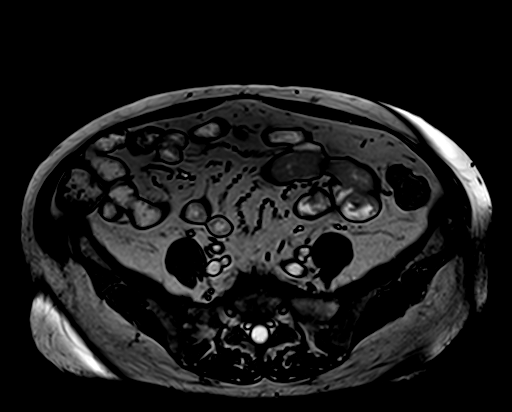

[Series 10: T1 dynamic · axial · non-contrast · 3.0mm · 1.19mm/px · z∈[-83,+130]mm · 3 of 72 slices shown (1 of 9)]
[im 1/72]
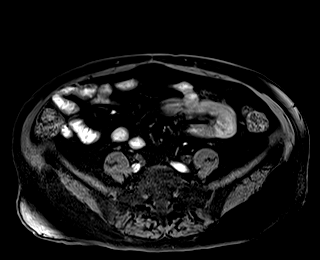
[im 36/72]
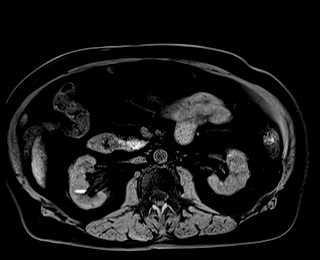
[im 72/72]
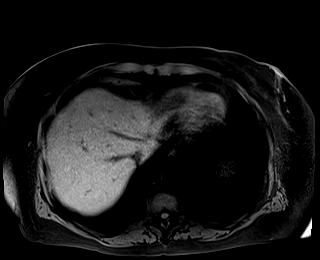

[Series 11: T1 dynamic · axial · 3.0mm · 1.19mm/px · z∈[-83,+130]mm · 3 of 72 slices shown (2 of 9)]
[im 1/72]
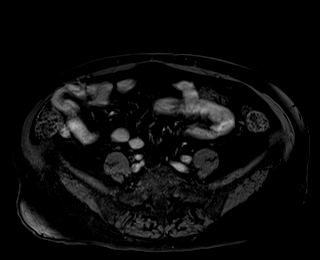
[im 36/72]
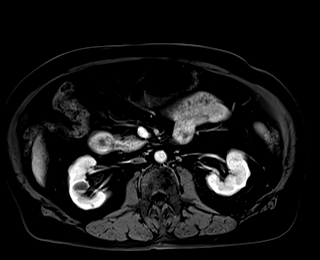
[im 72/72]
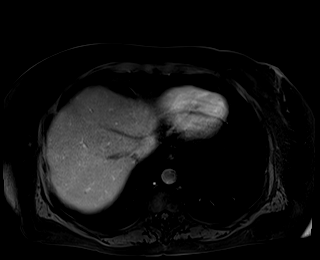

[Series 12: T1 dynamic · axial · 3.0mm · 1.19mm/px · z∈[-83,+130]mm · 3 of 72 slices shown (3 of 9)]
[im 1/72]
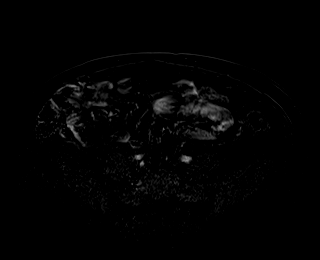
[im 36/72]
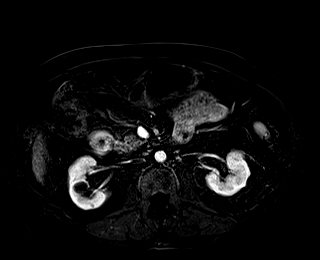
[im 72/72]
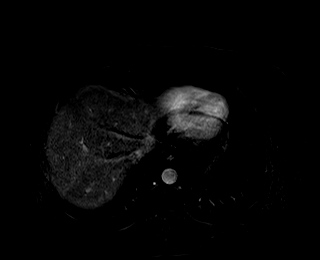

[Series 13: T1 dynamic · axial · 3.0mm · 1.19mm/px · z∈[-83,+130]mm · 3 of 72 slices shown (4 of 9)]
[im 1/72]
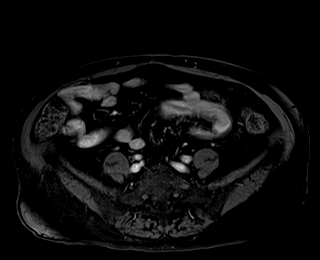
[im 36/72]
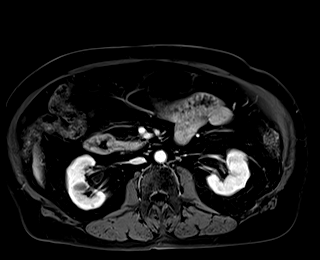
[im 72/72]
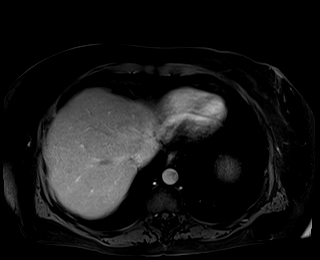

[Series 14: T1 dynamic · axial · 3.0mm · 1.19mm/px · z∈[-83,+130]mm · 3 of 72 slices shown (5 of 9)]
[im 1/72]
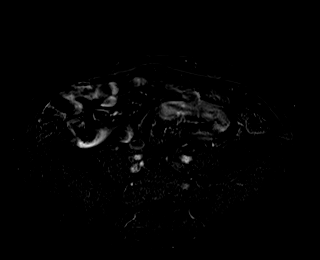
[im 36/72]
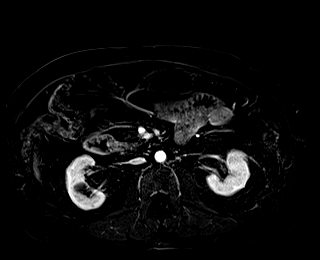
[im 72/72]
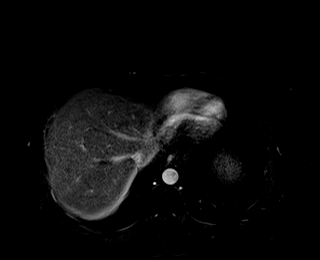

[Series 15: T1 dynamic · axial · 3.0mm · 1.19mm/px · z∈[-83,+130]mm · 3 of 72 slices shown (6 of 9)]
[im 1/72]
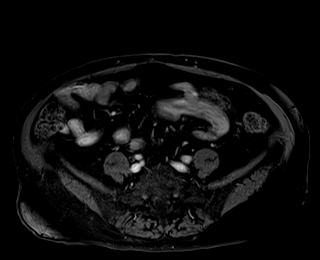
[im 36/72]
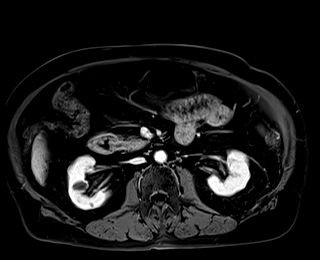
[im 72/72]
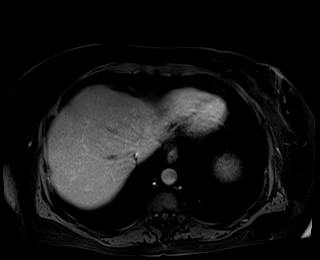

[Series 16: T1 dynamic · axial · 3.0mm · 1.19mm/px · z∈[-83,+130]mm · 3 of 72 slices shown (7 of 9)]
[im 1/72]
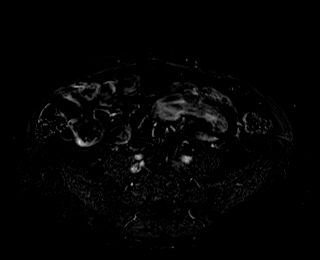
[im 36/72]
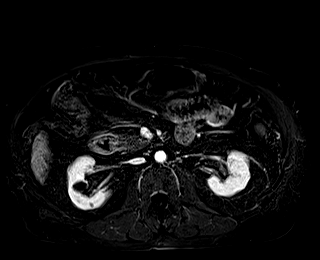
[im 72/72]
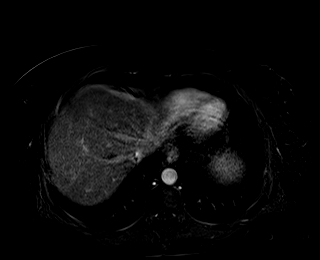

[Series 17: T1 dynamic post-contrast · coronal · 3.0mm · 1.31mm/px · 3 of 80 slices shown]
[im 1/80]
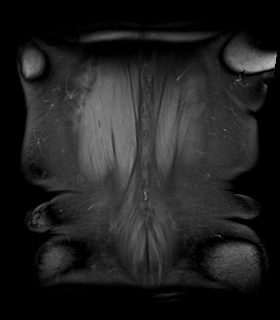
[im 40/80]
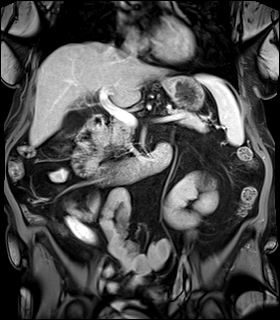
[im 80/80]
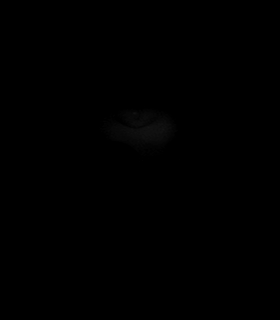

[Series 18: T1 dynamic · axial · 3.0mm · 1.19mm/px · z∈[-83,+130]mm · 3 of 72 slices shown (8 of 9)]
[im 1/72]
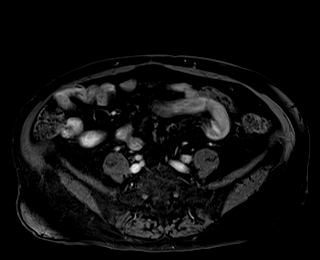
[im 36/72]
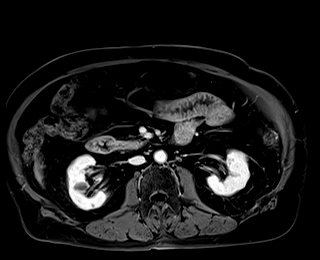
[im 72/72]
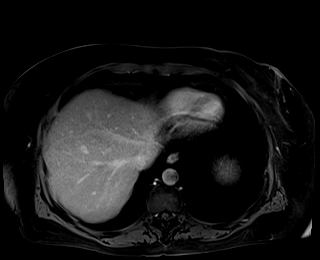

[Series 19: T1 dynamic · axial · 3.0mm · 1.19mm/px · z∈[-83,+130]mm · 3 of 72 slices shown (9 of 9)]
[im 1/72]
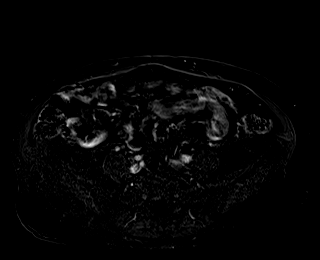
[im 36/72]
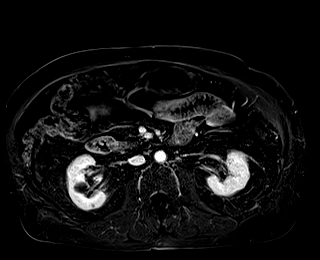
[im 72/72]
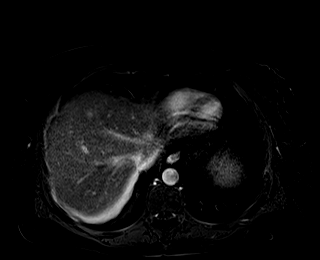

[Series 20: T2 · axial · 6.0mm · 1.19mm/px · 1 of 32 slices shown]
[im 1/32]
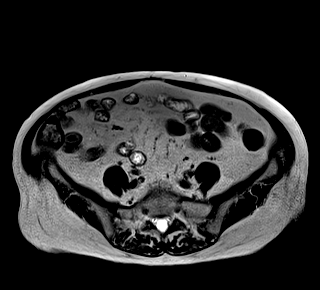

[Series 1011: out of phase · axial · 3.0mm · 1.19mm/px · z∈[-83,+130]mm · 3 of 72 slices shown]
[im 1/72]
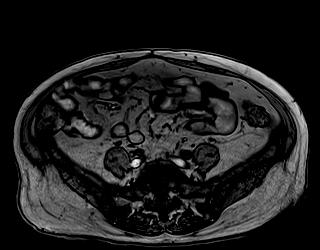
[im 36/72]
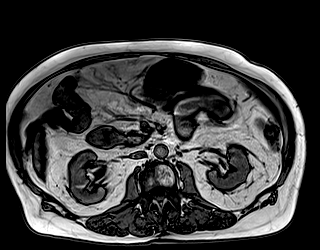
[im 72/72]
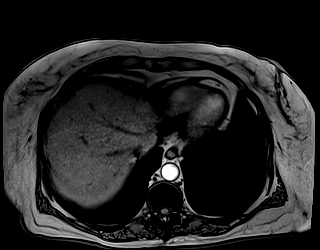

[48 of 48 positions shown; findings below may reference images not displayed]

FINDINGS: Lower chest: Normal heart size without pericardial or pleural
effusion.

Hepatobiliary: Mild cirrhosis. No suspicious liver lesion. Possible
tiny gallstones including on [DATE]. No acute cholecystitis or
biliary duct dilatation.

Pancreas:  Normal, without mass or ductal dilatation.

Spleen:  Normal in size, without focal abnormality.

Adrenals/Urinary Tract: Normal adrenal glands. Tiny left renal
cysts. A dominant lower pole exophytic right renal 4.5 cm cyst.

Dependent T1 hyperintensity within a lateral inter/upper pole right
renal 1.5 cm lesion on 36/11, consistent with a
hemorrhagic/proteinaceous cyst.

The lesion of interest, within the anterior inter/lower pole right
kidney, is again identified. Demonstrates multiple septations,
especially posteriorly and laterally. Enhancement within the
septations, including on 45/12.

Measures 1.6 x 1.4 cm on post-contrast image 45/12 today versus
x 1.7 cm on 66/17 of the prior exam (when remeasured). Based on
coronal postcontrast imaging, 2.1 cm craniocaudal on 32/17 today,
similar to on the prior exam (when remeasured).

Stomach/Bowel: Normal stomach and abdominal bowel loops.

Vascular/Lymphatic: Aortic atherosclerosis. Single renal arteries
bilaterally. Patent renal veins. No abdominal adenopathy. No
evidence of portal venous hypertension. Patent splenic and portal
veins.

Other:  No ascites.

Musculoskeletal: No acute osseous abnormality.
IMPRESSION: 1. Similar size of an inter/lower pole right renal mass which
remains a Bosniak 3 lesion.
2. No evidence of renal vein involvement or abdominal metastatic
disease.
3. Cirrhosis without hepatocellular carcinoma or portal venous
hypertension.
4. Possible cholelithiasis.
5.  Aortic Atherosclerosis ([9N]-[9N]).

## 2020-04-15 MED ORDER — GADOBUTROL 1 MMOL/ML IV SOLN
7.0000 mL | Freq: Once | INTRAVENOUS | Status: AC | PRN
  Administered 2020-04-15: 7 mL via INTRAVENOUS

## 2020-04-22 ENCOUNTER — Other Ambulatory Visit: Payer: Self-pay

## 2020-04-22 ENCOUNTER — Encounter: Payer: Self-pay | Admitting: Family Medicine

## 2020-04-22 ENCOUNTER — Ambulatory Visit (INDEPENDENT_AMBULATORY_CARE_PROVIDER_SITE_OTHER): Payer: Medicare HMO | Admitting: Family Medicine

## 2020-04-22 VITALS — BP 123/74 | HR 91 | Temp 98.4°F | Resp 16 | Wt 152.0 lb

## 2020-04-22 DIAGNOSIS — E119 Type 2 diabetes mellitus without complications: Secondary | ICD-10-CM

## 2020-04-22 DIAGNOSIS — B354 Tinea corporis: Secondary | ICD-10-CM

## 2020-04-22 DIAGNOSIS — Z1211 Encounter for screening for malignant neoplasm of colon: Secondary | ICD-10-CM | POA: Diagnosis not present

## 2020-04-22 DIAGNOSIS — Z23 Encounter for immunization: Secondary | ICD-10-CM

## 2020-04-22 LAB — POCT GLYCOSYLATED HEMOGLOBIN (HGB A1C)
Est. average glucose Bld gHb Est-mCnc: 126
Hemoglobin A1C: 6 % — AB (ref 4.0–5.6)

## 2020-04-22 MED ORDER — ACCU-CHEK AVIVA PLUS VI STRP: ORAL_STRIP | 4 refills | Status: DC

## 2020-04-22 MED ORDER — KETOCONAZOLE 2 % EX CREA: 1.0000 "application " | TOPICAL_CREAM | Freq: Every day | CUTANEOUS | 0 refills | Status: DC

## 2020-04-22 MED ORDER — TRIAMCINOLONE ACETONIDE 0.5 % EX CREA: 1.0000 "application " | TOPICAL_CREAM | Freq: Two times a day (BID) | CUTANEOUS | 0 refills | Status: AC

## 2020-04-22 NOTE — Patient Instructions (Addendum)
Covid-19 vaccines: The Covid vaccines have been given to hundreds of millions of people and found to be very effective and are as safe as any other vaccine.  The [employer] vaccine has been associated with very rare dangerous blood clots, but only in adult women under the age of 6.  The risk of dying from Covid infections is much higher than having a serious reaction to the vaccine.  I strongly recommend getting fully vaccinated against Covid-19.   I recommend that adult women under 60 get fully vaccinated, but the [place] and [employer] vaccines may be safer for those women than the [employer] vaccine.     You can stop taking glipizide. If you start seeing sugar over 180, then you can restart by taking 1/2 tablet daily

## 2020-04-23 NOTE — Progress Notes (Signed)
04/24/2020 3:36 PM   Anna Sweeney 09/17/1950 [id number]  Referring provider: [name], MD [address] [place],  [place] [address] Chief Complaint  Patient presents with  . Follow-up    HPI: Anna Sweeney is a 69 y.o. female who returns for a 4 month follow up of Bosniak 3 right renal cyst and review of interval MRI.  -Initially referred to GI for abnormal LFT evaluation -Hepatic MRI with/without contrast performed 12/06/2019 -Incidental finding of a 2.3 x 2.1 cm complex renal cyst anterior right lower pole which had thickened, enhancing internal septations consistent with Bosniak 3 -Additional simple and Bosniak 2 cysts noted -Abdominal MRI w/ w/o contrast on 04/15/2020 revealed similar size of an inter/lower pole right renal mass which remains a Bosniak 3 lesion. -Cyst was remeasured and on previous scan was 1.6 x 1.7 x 2.1 with the most recent scan measuring 1.6 x 1.4 by 2.1 - She is doing well. She has no complaints.  Denies flank, abdominal pain or gross hematuria    PMH: Past Medical History:  Diagnosis Date  . BRCA negative 11/15/2012   [facility]  . Diabetes mellitus without complication ([place])   . History of chicken pox   . Hypertension     Surgical History: Past Surgical History:  Procedure Laterality Date  . FOOT SURGERY      Home Medications:  Allergies as of 04/24/2020      Reactions   Pioglitazone    Dizziness      Medication List       Accurate as of April 24, 2020  3:36 PM. If you have any questions, ask your nurse or doctor.        Accu-Chek Aviva Plus test strip Generic drug: glucose blood Use to check blood sugar daily for type 2 diabetes. E11.9   Accu-Chek Aviva Plus w/Device Kit Use to check blood sugar daily for type 2 diabetes. E11.9   Accu-Chek Softclix Lancets lancets Use to check blood sugar daily for type 2 diabetes. E11.9   amLODipine 5 MG tablet Commonly known as: NORVASC Take 1 tablet (5 mg total) by  mouth daily.   aspirin EC 81 MG tablet Take 81 mg by mouth daily.   betamethasone dipropionate 0.05 % cream Apply topically 2 (two) times daily as needed.   ketoconazole 2 % cream Commonly known as: NIZORAL Apply 1 application topically daily.   lovastatin 20 MG tablet Commonly known as: MEVACOR TAKE 1 TABLET BY MOUTH (20 MG TOTAL) AT BEDTIME   metFORMIN 1000 MG tablet Commonly known as: GLUCOPHAGE Take 1 tablet (1,000 mg total) by mouth 2 (two) times daily.   trandolapril 4 MG tablet Commonly known as: MAVIK Take 1 tablet (4 mg total) by mouth daily.   triamcinolone cream 0.5 % Commonly known as: KENALOG Apply 1 application topically 2 (two) times daily.   Trulicity 1.5 MH/9.6QI Sopn Generic drug: Dulaglutide INJECT 1.5 MG INTO THE SKIN ONCE A WEEK.   verapamil 240 MG 24 hr capsule Commonly known as: VERELAN PM TAKE 1 CAPSULE (240 MG TOTAL) BY MOUTH AT BEDTIME.   VITAMIN D-3 PO Take 1 tablet by mouth daily.       Allergies:  Allergies  Allergen Reactions  . Pioglitazone     Dizziness    Family History: Family History  Problem Relation Age of Onset  . Breast cancer Mother   . Breast cancer Sister   . Cancer Sister        renal  Social History:  reports that she has quit smoking. She has a 4.00 pack-year smoking history. She has never used smokeless tobacco. She reports that she does not drink alcohol and does not use drugs.   Physical Exam: BP (!) 144/74   Pulse 89   Ht 5' 6"  (1.676 m)   Wt 152 lb (68.9 kg)   BMI 24.53 kg/m   Constitutional:  Alert and oriented, No acute distress. HEENT: [place] AT, moist mucus membranes.  Trachea midline, no masses. Cardiovascular: No clubbing, cyanosis, or edema. Respiratory: Normal respiratory effort, no increased work of breathing. Skin: No rashes, bruises or suspicious lesions. Neurologic: Grossly intact, no focal deficits, moving all 4 extremities. Psychiatric: Normal mood and affect.   Pertinent  Imaging: CLINICAL DATA:  Surveillance of renal cyst.  Asymptomatic.  EXAM: MRI ABDOMEN WITHOUT AND WITH CONTRAST  TECHNIQUE: Multiplanar multisequence MR imaging of the abdomen was performed both before and after the administration of intravenous contrast.  CONTRAST:  85m GADAVIST GADOBUTROL 1 MMOL/ML IV SOLN  COMPARISON:  12/06/2019  FINDINGS: Lower chest: Normal heart size without pericardial or pleural effusion.  Hepatobiliary: Mild cirrhosis. No suspicious liver lesion. Possible tiny gallstones including on 12/20. No acute cholecystitis or biliary duct dilatation.  Pancreas:  Normal, without mass or ductal dilatation.  Spleen:  Normal in size, without focal abnormality.  Adrenals/Urinary Tract: Normal adrenal glands. Tiny left renal cysts. A dominant lower pole exophytic right renal 4.5 cm cyst.  Dependent T1 hyperintensity within a lateral inter/upper pole right renal 1.5 cm lesion on 36/11, consistent with a hemorrhagic/proteinaceous cyst.  The lesion of interest, within the anterior inter/lower pole right kidney, is again identified. Demonstrates multiple septations, especially posteriorly and laterally. Enhancement within the septations, including on 45/12.  Measures 1.6 x 1.4 cm on post-contrast image 45/12 today versus 1.6 x 1.7 cm on 66/17 of the prior exam (when remeasured). Based on coronal postcontrast imaging, 2.1 cm craniocaudal on 32/17 today, similar to on the prior exam (when remeasured).  Stomach/Bowel: Normal stomach and abdominal bowel loops.  Vascular/Lymphatic: Aortic atherosclerosis. Single renal arteries bilaterally. Patent renal veins. No abdominal adenopathy. No evidence of portal venous hypertension. Patent splenic and portal veins.  Other:  No ascites.  Musculoskeletal: No acute osseous abnormality.  IMPRESSION: 1. Similar size of an inter/lower pole right renal mass which remains a Bosniak 3 lesion. 2. No  evidence of renal vein involvement or abdominal metastatic disease. 3. Cirrhosis without hepatocellular carcinoma or portal venous hypertension. 4. Possible cholelithiasis. 5.  Aortic Atherosclerosis (ICD10-I70.0).   Electronically Signed   By: KAbigail MiyamotoM.D.   On: 04/15/2020 14:05  I have personally reviewed the images and agree with radiologist interpretation.     Assessment & Plan:    1. Bosniak 3 right renal cyst   We again discussed that approximately 50% of Bosniak 3 cyst are malignant and options of surveillance versus treatment were again reviewed  Stable on recent abdominal MRI and she desires continued surveillance.   RTC in 6 months with abdominal MRI.   [place] [place] [phone] I, ASelena Batten am acting as a scribe for Dr. SNicki ReaperC. Tayten Bergdoll,  I have reviewed the above documentation for accuracy and completeness, and I agree with the above.   SAbbie Sons MD

## 2020-04-24 ENCOUNTER — Ambulatory Visit (INDEPENDENT_AMBULATORY_CARE_PROVIDER_SITE_OTHER): Payer: Medicare HMO | Admitting: Urology

## 2020-04-24 ENCOUNTER — Encounter: Payer: Self-pay | Admitting: Urology

## 2020-04-24 ENCOUNTER — Encounter: Payer: Self-pay | Admitting: Family Medicine

## 2020-04-24 ENCOUNTER — Other Ambulatory Visit: Payer: Self-pay

## 2020-04-24 VITALS — BP 144/74 | HR 89 | Ht 66.0 in | Wt 152.0 lb

## 2020-04-24 DIAGNOSIS — N281 Cyst of kidney, acquired: Secondary | ICD-10-CM

## 2020-04-25 ENCOUNTER — Encounter: Payer: Self-pay | Admitting: Urology

## 2020-04-29 ENCOUNTER — Telehealth: Payer: Self-pay | Admitting: Family Medicine

## 2020-04-29 DIAGNOSIS — R21 Rash and other nonspecific skin eruption: Secondary | ICD-10-CM

## 2020-04-29 MED ORDER — DOXYCYCLINE HYCLATE 100 MG PO TABS: 100.0000 mg | ORAL_TABLET | Freq: Two times a day (BID) | ORAL | 0 refills | Status: DC

## 2020-04-29 NOTE — Telephone Encounter (Signed)
Have sent prescription for antibiotic to cover for various bacterial infections. Also initiated referral to derm in case the antibiotic doesn't work.

## 2020-04-29 NOTE — Telephone Encounter (Signed)
Please review. Does pt need another OV?

## 2020-04-29 NOTE — Telephone Encounter (Signed)
Pt called stating that she received a creams, ketoconazole and triamcinolone,  for a rash on her leg. Pt states that she has been using the cream, but that it is not working. She states that the rash almost looks worse. Please advise.       [id number] - [place], [place] - [address]  [address] [place] [address]  Phone: [phone] Fax: [phone]  Hours: Not open 24 hours

## 2020-05-01 ENCOUNTER — Ambulatory Visit (INDEPENDENT_AMBULATORY_CARE_PROVIDER_SITE_OTHER): Payer: Medicare HMO

## 2020-05-01 ENCOUNTER — Ambulatory Visit: Payer: Medicare HMO

## 2020-05-01 ENCOUNTER — Other Ambulatory Visit: Payer: Self-pay

## 2020-05-01 DIAGNOSIS — Z23 Encounter for immunization: Secondary | ICD-10-CM | POA: Diagnosis not present

## 2020-05-07 ENCOUNTER — Encounter: Payer: Self-pay | Admitting: Dermatology

## 2020-05-07 ENCOUNTER — Other Ambulatory Visit: Payer: Self-pay

## 2020-05-07 ENCOUNTER — Ambulatory Visit: Payer: Medicare HMO | Admitting: Dermatology

## 2020-05-07 DIAGNOSIS — R21 Rash and other nonspecific skin eruption: Secondary | ICD-10-CM | POA: Diagnosis not present

## 2020-05-07 DIAGNOSIS — L249 Irritant contact dermatitis, unspecified cause: Secondary | ICD-10-CM

## 2020-05-07 DIAGNOSIS — L308 Other specified dermatitis: Secondary | ICD-10-CM | POA: Diagnosis not present

## 2020-05-07 NOTE — Patient Instructions (Signed)

## 2020-05-07 NOTE — Progress Notes (Signed)
   New Patient Visit  Subjective  Anna Sweeney is a 69 y.o. female who presents for the following: Rash (New pt presents with a rash on L leg x 1 month, treating with Triamcinolone cream, Doxycyline tablets with a poor response). Check irritation on the back itching for several weeks.   The following portions of the chart were reviewed this encounter and updated as appropriate:  Tobacco  Allergies  Meds  Problems  Med Hx  Surg Hx  Fam Hx     Review of Systems:  No other skin or systemic complaints except as noted in HPI or Assessment and Plan.  Objective  Well appearing patient in no apparent distress; mood and affect are within normal limits.  A focused examination was performed including face,back, L leg. Relevant physical exam findings are noted in the Assessment and Plan.  Objective  Left Lower Leg: Pink scaly patches and plaque  Images      Objective  Mid Back: Mild pinkness    Assessment & Plan  Rash -failed treatment with oral doxycycline and topical triamcinolone Left Lower Leg R/O Tinea vs Stasis vs Eczema  We will discuss treatment options pending Biopsy result   Skin / nail biopsy - Left Lower Leg Type of biopsy: tangential   Informed consent: discussed and consent obtained   Patient was prepped and draped in usual sterile fashion: area prepped with alochol. Anesthesia: the lesion was anesthetized in a standard fashion   Anesthetic:  1% lidocaine w/ epinephrine 1-100,000 buffered w/ 8.4% NaHCO3 Instrument used: flexible razor blade   Hemostasis achieved with: pressure, aluminum chloride and electrodesiccation   Outcome: patient tolerated procedure well   Post-procedure details: wound care instructions given   Post-procedure details comment:  Ointment and small bandage  Specimen 1 - Surgical pathology Differential Diagnosis: R/O Tinea vs Stasis vs Eczema  Check Margins: No Pink scaly patches and plaque  Irritant contact dermatitis, unspecified  trigger Mid Back Start Triamcinolone cream apply to back qd-bid prn  Pt already has Triamcinolone cream   Return in about 2 weeks (around 05/21/2020).  IAngelique Holm, CMA, am acting as scribe for Armida Sans, MD .  Documentation: I have reviewed the above documentation for accuracy and completeness, and I agree with the above.  Armida Sans, MD

## 2020-05-08 ENCOUNTER — Other Ambulatory Visit: Payer: Self-pay | Admitting: Family Medicine

## 2020-05-08 DIAGNOSIS — B354 Tinea corporis: Secondary | ICD-10-CM

## 2020-05-12 ENCOUNTER — Telehealth: Payer: Self-pay

## 2020-05-12 DIAGNOSIS — R21 Rash and other nonspecific skin eruption: Secondary | ICD-10-CM

## 2020-05-12 MED ORDER — CLOBETASOL PROP EMOLLIENT BASE 0.05 % EX CREA: TOPICAL_CREAM | CUTANEOUS | 0 refills | Status: DC

## 2020-05-12 NOTE — Telephone Encounter (Signed)
Patient informed of results and to keep follow up appointment on 05/22/20.

## 2020-05-12 NOTE — Telephone Encounter (Signed)
-----   Message from Deirdre Evener, MD sent at 05/12/2020 12:23 PM EDT ----- Skin , left lower leg SPONGIOTIC DERMATITIS, SEE DESCRIPTION  Spongiotic dermatitis  Consistent with contact vs eczema Send Clobetasol cream bid Keep fu appt

## 2020-05-21 ENCOUNTER — Encounter: Payer: Self-pay | Admitting: Family Medicine

## 2020-05-21 DIAGNOSIS — E7800 Pure hypercholesterolemia, unspecified: Secondary | ICD-10-CM | POA: Diagnosis not present

## 2020-05-21 DIAGNOSIS — E119 Type 2 diabetes mellitus without complications: Secondary | ICD-10-CM | POA: Diagnosis not present

## 2020-05-21 DIAGNOSIS — Z135 Encounter for screening for eye and ear disorders: Secondary | ICD-10-CM | POA: Diagnosis not present

## 2020-05-21 DIAGNOSIS — I10 Essential (primary) hypertension: Secondary | ICD-10-CM | POA: Diagnosis not present

## 2020-05-21 DIAGNOSIS — H35033 Hypertensive retinopathy, bilateral: Secondary | ICD-10-CM | POA: Diagnosis not present

## 2020-05-21 DIAGNOSIS — H524 Presbyopia: Secondary | ICD-10-CM | POA: Diagnosis not present

## 2020-05-21 DIAGNOSIS — H18413 Arcus senilis, bilateral: Secondary | ICD-10-CM | POA: Diagnosis not present

## 2020-05-21 DIAGNOSIS — Z0100 Encounter for examination of eyes and vision without abnormal findings: Secondary | ICD-10-CM | POA: Diagnosis not present

## 2020-05-21 DIAGNOSIS — H40023 Open angle with borderline findings, high risk, bilateral: Secondary | ICD-10-CM | POA: Diagnosis not present

## 2020-05-21 LAB — HM DIABETES EYE EXAM

## 2020-05-22 ENCOUNTER — Ambulatory Visit: Payer: Medicare HMO | Admitting: Dermatology

## 2020-05-22 ENCOUNTER — Encounter: Payer: Self-pay | Admitting: Dermatology

## 2020-05-22 ENCOUNTER — Other Ambulatory Visit: Payer: Self-pay

## 2020-05-22 DIAGNOSIS — L853 Xerosis cutis: Secondary | ICD-10-CM

## 2020-05-22 DIAGNOSIS — R21 Rash and other nonspecific skin eruption: Secondary | ICD-10-CM

## 2020-05-22 NOTE — Patient Instructions (Signed)
Start Cerave cream for moisturizer, and Dove for sensitive skin for soap

## 2020-05-22 NOTE — Progress Notes (Signed)
   Follow-Up Visit   Subjective  Anna Sweeney is a 69 y.o. female who presents for the following: bx proven spongiotic derm contact vs eczema (L lower leg 2 wk f/u, Clobetasol cr bid, itching improved).  The following portions of the chart were reviewed this encounter and updated as appropriate:  Tobacco  Allergies  Meds  Problems  Med Hx  Surg Hx  Fam Hx     Review of Systems:  No other skin or systemic complaints except as noted in HPI or Assessment and Plan.  Objective  Well appearing patient in no apparent distress; mood and affect are within normal limits.  A focused examination was performed including left lower leg. Relevant physical exam findings are noted in the Assessment and Plan.  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  In this patient eczema/atopic dermatitis is the most likely diagnosis due to distribution and location.  She is improving on current treatment. left lower leg Bx prove spongiotic derm contact vs eczema  Cont Colbetasol cream bid until finish two weeks, then decrease to qd up to 5 days a week until clear  Xerosis cutis legs Recommend mild soap and cerave moisturizer, samples of Dove for Sensitive skin given  Return if symptoms worsen or fail to improve.  I, Ardis Rowan, RMA, am acting as scribe for Armida Sans, MD .  Documentation: I have reviewed the above documentation for accuracy and completeness, and I agree with the above.  Armida Sans, MD

## 2020-06-25 DIAGNOSIS — H2513 Age-related nuclear cataract, bilateral: Secondary | ICD-10-CM | POA: Diagnosis not present

## 2020-06-25 DIAGNOSIS — H35373 Puckering of macula, bilateral: Secondary | ICD-10-CM | POA: Diagnosis not present

## 2020-06-25 DIAGNOSIS — H40013 Open angle with borderline findings, low risk, bilateral: Secondary | ICD-10-CM | POA: Diagnosis not present

## 2020-07-09 ENCOUNTER — Telehealth: Payer: Self-pay

## 2020-07-09 NOTE — Progress Notes (Signed)
Chronic Care Management Pharmacy Assistant   Name: Anna Sweeney  MRN: [id number] DOB: [birth year]  Reason for Encounter: Medication Management  Patient Questions:  Patient has been referred to [facility] Pharmacist with need for renewal on Trulicity patient assistance.  Anna Sweeney,  69 y.o. , female PCP : [name], MD  Allergies:   Allergies  Allergen Reactions  . Pioglitazone     Dizziness    Medications: Outpatient Encounter Medications as of 07/09/2020  Medication Sig  . Accu-Chek Softclix Lancets lancets Use to check blood sugar daily for type 2 diabetes. E11.9  . amLODipine (NORVASC) 5 MG tablet Take 1 tablet (5 mg total) by mouth daily.  Marland Kitchen aspirin EC 81 MG tablet Take 81 mg by mouth daily.  . betamethasone dipropionate (DIPROLENE) 0.05 % cream Apply topically 2 (two) times daily as needed.  . Blood Glucose Monitoring Suppl (ACCU-CHEK AVIVA PLUS) w/Device KIT Use to check blood sugar daily for type 2 diabetes. E11.9  . Cholecalciferol (VITAMIN D-3 PO) Take 1 tablet by mouth daily.  . Clobetasol Prop Emollient Base (CLOBETASOL PROPIONATE E) 0.05 % emollient cream Apply to aa's lower legs BID until clear. Then PRN thereafter. Avoid f/g/a.  Marland Kitchen doxycycline (VIBRA-TABS) 100 MG tablet Take 1 tablet (100 mg total) by mouth 2 (two) times daily.  . Dulaglutide (TRULICITY) 1.5 JF/9.9OQ SOPN INJECT 1.5 MG INTO THE SKIN ONCE A WEEK.  Marland Kitchen glucose blood (ACCU-CHEK AVIVA PLUS) test strip Use to check blood sugar daily for type 2 diabetes. E11.9  . ketoconazole (NIZORAL) 2 % cream APPLY TO AFFECTED AREA EVERY DAY  . lovastatin (MEVACOR) 20 MG tablet TAKE 1 TABLET BY MOUTH (20 MG TOTAL) AT BEDTIME  . metFORMIN (GLUCOPHAGE) 1000 MG tablet Take 1 tablet (1,000 mg total) by mouth 2 (two) times daily.  . trandolapril (MAVIK) 4 MG tablet Take 1 tablet (4 mg total) by mouth daily.  Marland Kitchen triamcinolone cream (KENALOG) 0.5 % Apply 1 application topically 2 (two) times daily.  . verapamil (VERELAN PM)  240 MG 24 hr capsule TAKE 1 CAPSULE (240 MG TOTAL) BY MOUTH AT BEDTIME.   No facility-administered encounter medications on file as of 07/09/2020.    Current Diagnosis: Patient Assistance Coordination   Follow-Up:  Patient Assistance Coordination Mailed application for Trulicity patient assistance to patient. Patient to complete and return it and any needed supporting documents for so that Fisher, Kirstie Peri, MD can provide prescription for application. Patient may contact me if any questions, phone number provided.

## 2020-07-16 ENCOUNTER — Telehealth: Payer: Self-pay
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
## 2020-08-15 ENCOUNTER — Other Ambulatory Visit: Payer: Self-pay | Admitting: Dermatology

## 2020-08-15 DIAGNOSIS — R21 Rash and other nonspecific skin eruption: Secondary | ICD-10-CM

## 2020-08-18 ENCOUNTER — Other Ambulatory Visit: Payer: Self-pay | Admitting: Dermatology

## 2020-08-18 DIAGNOSIS — R21 Rash and other nonspecific skin eruption: Secondary | ICD-10-CM

## 2020-08-18 MED ORDER — CLOBETASOL PROP EMOLLIENT BASE 0.05 % EX CREA: TOPICAL_CREAM | CUTANEOUS | 0 refills | Status: DC

## 2020-08-25 ENCOUNTER — Ambulatory Visit: Payer: Self-pay

## 2020-08-25 NOTE — Telephone Encounter (Addendum)
Pt stated that she is not sure that she got whole dose of Trulicity. Pt stated that she pushed the med in but when she pulled the needle out, the med streamed across the room. Pt wanting to know if she should take dose of Truliciity now. Advised pt to take it at her normal time tonight. Advised pt to report to pharmacy. Pt advised to call back for further issues.

## 2020-09-03 ENCOUNTER — Telehealth: Payer: Self-pay | Admitting: Family Medicine

## 2020-09-03 ENCOUNTER — Telehealth: Payer: Self-pay

## 2020-09-03 NOTE — Telephone Encounter (Signed)
Pt called about bringing in some paperwork for [facility]  that needs to be completed by the PCP/ Pt stated she completed something like this already and she spoke with Idelle Crouch and asked to speak with her / Pt is confused on why she is doing this again and thought this was taken care of/ Pt stated she will bring by paper work Wynelle Link advise

## 2020-09-03 NOTE — Telephone Encounter (Signed)
Alex, are you or [facility] helping this patient with medication assistance? Please advise.

## 2020-09-03 NOTE — Chronic Care Management (AMB) (Signed)
09/03/2020- Patient called inquiring about getting another [employer] form when she already completed one. Patient aware I will call [employer] to inquire.  [employer], spoke with representative, she informed me that their October letter were not sent until recently, so patient's are receiving 2 applications but to please ignore the 2nd one from October if they have already completed. She also mentioned to me that those patient's with Medicare D will renew at the end of each calendar year and those with Medicare or Self Pay do not have to renew their application until a full year (12 months).   Called patient to inform she can get rid of the extra application, they have received her form on 07/30/2020 and she was given a 4 months supply. Patient states she is not out of any medications and does have her 4 months supply but very appreciative of the help. Angelena Sole, CPP notified.  Billee Cashing, CMA Clinical Pharmacist Assistant [phone]

## 2020-09-11 ENCOUNTER — Telehealth: Payer: Self-pay | Admitting: Family Medicine

## 2020-09-11 NOTE — Telephone Encounter (Signed)
Copied from CRM [id number]. Topic: Medicare AWV >> Sep 11, 2020 11:23 AM Claudette Laws R wrote: Reason for CRM:   Left message for patient to call back and schedule Medicare Annual Wellness Visit (AWV) in office.   If not able to come in office, please offer to do virtually or by telephone.   Last AWV 09/18/2019  Please schedule at anytime with [facility] Health Advisor.  If any questions, please contact me at [phone]

## 2020-09-22 NOTE — Progress Notes (Unsigned)
Subjective:   Anna Sweeney is a 70 y.o. female who presents for Medicare Annual (Subsequent) preventive examination.  I connected with Kolleen Ochsner today by telephone and verified that I am speaking with the correct person using two identifiers. Location patient: home Location provider: work Persons participating in the virtual visit: patient, provider.   I discussed the limitations, risks, security and privacy concerns of performing an evaluation and management service by telephone and the availability of in person appointments. I also discussed with the patient that there may be a patient responsible charge related to this service. The patient expressed understanding and verbally consented to this telephonic visit.    Interactive audio and video telecommunications were attempted between this provider and patient, however failed, due to patient having technical difficulties OR patient did not have access to video capability.  We continued and completed visit with audio only.   Review of Systems    N/A  Cardiac Risk Factors include: advanced age (>35mn, >>64women);diabetes mellitus;dyslipidemia;hypertension     Objective:    There were no vitals filed for this visit. There is no height or weight on file to calculate BMI.  Advanced Directives 09/23/2020 07/17/2018 08/18/2017  Does Patient Have a Medical Advance Directive? No No No  Would patient like information on creating a medical advance directive? No - Patient declined No - Patient declined -    Current Medications (verified) Outpatient Encounter Medications as of 09/23/2020  Medication Sig  . Accu-Chek Softclix Lancets lancets Use to check blood sugar daily for type 2 diabetes. E11.9  . amLODipine (NORVASC) 5 MG tablet Take 1 tablet (5 mg total) by mouth daily.  .Marland Kitchenaspirin EC 81 MG tablet Take 81 mg by mouth daily.  . Blood Glucose Monitoring Suppl (ACCU-CHEK AVIVA PLUS) w/Device KIT Use to check blood sugar daily for type 2  diabetes. E11.9  . Cholecalciferol (VITAMIN D-3 PO) Take 1 tablet by mouth daily.  . Clobetasol Prop Emollient Base (CLOBETASOL PROPIONATE E) 0.05 % emollient cream Apply to aa's lower legs BID until clear. Then PRN thereafter. Avoid f/g/a.  . Dulaglutide (TRULICITY) 1.5 MOX/7.3ZHSOPN INJECT 1.5 MG INTO THE SKIN ONCE A WEEK.  .Marland Kitchenglucose blood (ACCU-CHEK AVIVA PLUS) test strip Use to check blood sugar daily for type 2 diabetes. E11.9  . ketoconazole (NIZORAL) 2 % cream APPLY TO AFFECTED AREA EVERY DAY  . lovastatin (MEVACOR) 20 MG tablet TAKE 1 TABLET BY MOUTH (20 MG TOTAL) AT BEDTIME  . metFORMIN (GLUCOPHAGE) 1000 MG tablet Take 1 tablet (1,000 mg total) by mouth 2 (two) times daily.  . trandolapril (MAVIK) 4 MG tablet Take 1 tablet (4 mg total) by mouth daily.  .Marland Kitchentriamcinolone cream (KENALOG) 0.5 % Apply 1 application topically 2 (two) times daily.  . verapamil (VERELAN PM) 240 MG 24 hr capsule TAKE 1 CAPSULE (240 MG TOTAL) BY MOUTH AT BEDTIME.  . betamethasone dipropionate (DIPROLENE) 0.05 % cream Apply topically 2 (two) times daily as needed. (Patient not taking: Reported on 09/23/2020)  . doxycycline (VIBRA-TABS) 100 MG tablet Take 1 tablet (100 mg total) by mouth 2 (two) times daily. (Patient not taking: Reported on 09/23/2020)   No facility-administered encounter medications on file as of 09/23/2020.    Allergies (verified) Pioglitazone   History: Past Medical History:  Diagnosis Date  . BRCA negative 11/15/2012   [facility]  . Diabetes mellitus without complication ([facility]   . History of chicken pox   . Hypertension    Past Surgical History:  Procedure Laterality Date  . FOOT SURGERY     Family History  Problem Relation Age of Onset  . Breast cancer Mother   . Breast cancer Sister   . Cancer Sister        renal    Social History   Socioeconomic History  . Marital status: Widowed    Spouse name: Not on file  . Number of children: 2  . Years of education: Not on file  .  Highest education level: Some college, no degree  Occupational History  . Occupation: retired  Tobacco Use  . Smoking status: Former Smoker    Packs/day: 1.00    Years: 4.00    Pack years: 4.00  . Smokeless tobacco: Never Used  . Tobacco comment: quit in 1991  Vaping Use  . Vaping Use: Never used  Substance and Sexual Activity  . Alcohol use: No  . Drug use: No  . Sexual activity: Yes    Birth control/protection: Post-menopausal  Other Topics Concern  . Not on file  Social History Narrative  . Not on file   Social Determinants of Health   Financial Resource Strain: Low Risk   . Difficulty of Paying Living Expenses: Not hard at all  Food Insecurity: No Food Insecurity  . Worried About Charity fundraiser in the Last Year: Never true  . Ran Out of Food in the Last Year: Never true  Transportation Needs: No Transportation Needs  . Lack of Transportation (Medical): No  . Lack of Transportation (Non-Medical): No  Physical Activity: Inactive  . Days of Exercise per Week: 0 days  . Minutes of Exercise per Session: 0 min  Stress: No Stress Concern Present  . Feeling of Stress : Not at all  Social Connections: Socially Isolated  . Frequency of Communication with Friends and Family: More than three times a week  . Frequency of Social Gatherings with Friends and Family: Once a week  . Attends Religious Services: Never  . Active Member of Clubs or Organizations: No  . Attends Archivist Meetings: Never  . Marital Status: Widowed    Tobacco Counseling Counseling given: Not Answered Comment: quit in 1991   Clinical Intake:  Pre-visit preparation completed: Yes  Pain : No/denies pain     Nutritional Risks: None Diabetes: Yes  How often do you need to have someone help you when you read instructions, pamphlets, or other written materials from your doctor or pharmacy?: 1 - Never  Diabetic? Yes  Nutrition Risk Assessment:  Has the patient had any N/V/D  within the last 2 months?  No  Does the patient have any non-healing wounds?  No  Has the patient had any unintentional weight loss or weight gain?  No   Diabetes:  Is the patient diabetic?  Yes  If diabetic, was a CBG obtained today?  No  Did the patient bring in their glucometer from home?  No  How often do you monitor your CBG's? Hasn't checked recently.   Financial Strains and Diabetes Management:  Are you having any financial strains with the device, your supplies or your medication? No .  Does the patient want to be seen by Chronic Care Management for management of their diabetes?  No  Would the patient like to be referred to a Nutritionist or for Diabetic Management?  No   Diabetic Exams:  Diabetic Eye Exam: Completed 05/21/20 Diabetic Foot Exam: Overdue, Pt has been advised about the importance in completing this exam.  Interpreter Needed?: No  Information entered by :: [facility], LPN   Activities of Daily Living In your present state of health, do you have any difficulty performing the following activities: 09/23/2020  Hearing? N  Vision? N  Difficulty concentrating or making decisions? N  Walking or climbing stairs? N  Dressing or bathing? N  Doing errands, shopping? N  Preparing Food and eating ? N  Using the Toilet? N  In the past six months, have you accidently leaked urine? Y  Comment Occasionally when going out to eat.  Do you have problems with loss of bowel control? N  Managing your Medications? N  Managing your Finances? N  Housekeeping or managing your Housekeeping? N  Some recent data might be hidden    Patient Care Team: [name], MD as PCP - General (Family Medicine) Garth Bigness as Referring Physician (Chiropractic Medicine) [facility], [facility], Ronda Fairly, MD (Urology)  Indicate any recent Medical Services you may have received from other than [facility] providers in the past year (date may be  approximate).     Assessment:   This is a routine wellness examination for Syliva.  Hearing/Vision screen No exam data present  Dietary issues and exercise activities discussed: Current Exercise Habits: The patient does not participate in regular exercise at present, Exercise limited by: None identified  Goals    . DIET - INCREASE WATER INTAKE     Recommend increasing water intake to at least 3 glasses every day.     . Prevent falls     Recommend to remove any items from the home that may cause slips or trips.      Depression Screen PHQ 2/9 Scores 09/23/2020 09/18/2019 07/17/2018 11/28/2017 11/16/2016 07/17/2015  PHQ - 2 Score 0 0 1 0 0 0  PHQ- 9 Score - - - 0 2 0    Fall Risk Fall Risk  09/23/2020 09/18/2019 07/17/2018 11/28/2017 11/16/2016  Falls in the past year? 1 0 0 No No  Number falls in past yr: 0 0 - - -  Injury with Fall? 0 0 - - -  Follow up Falls prevention discussed Falls evaluation completed - - -    FALL RISK PREVENTION PERTAINING TO THE HOME:  Any stairs in or around the home? Yes  If so, are there any without handrails? No  Home free of loose throw rugs in walkways, pet beds, electrical cords, etc? Yes  Adequate lighting in your home to reduce risk of falls? Yes   ASSISTIVE DEVICES UTILIZED TO PREVENT FALLS:  Life alert? No  Use of a cane, walker or w/c? No  Grab bars in the bathroom? No  Shower chair or bench in shower? No  Elevated toilet seat or a handicapped toilet? No     Cognitive Function: Normal cognitive status assessed by observation by this Nurse Health Advisor. No abnormalities found.          Immunizations Immunization History  Administered Date(s) Administered  . Fluad Quad(high Dose 65+) 06/15/2019, 05/01/2020  . Influenza, High Dose Seasonal PF 04/19/2018  . Influenza,inj,Quad PF,6+ Mos 07/17/2015, 07/09/2016  . Pneumococcal Conjugate-13 11/16/2016  . Pneumococcal Polysaccharide-23 09/09/2011, 07/17/2018  . Tdap 07/10/2013    TDAP  status: Up to date  Flu Vaccine status: Up to date  Pneumococcal vaccine status: Up to date  Covid-19 vaccine status: Declined, Education has been provided regarding the importance of this vaccine but patient still declined. Advised may receive this vaccine at  local pharmacy or Health Dept.or vaccine clinic. Aware to provide a copy of the vaccination record if obtained from local pharmacy or Health Dept. Verbalized acceptance and understanding.  Qualifies for Shingles Vaccine? Yes   Zostavax completed Yes   Shingrix Completed?: No.    Education has been provided regarding the importance of this vaccine. Patient has been advised to call insurance company to determine out of pocket expense if they have not yet received this vaccine. Advised may also receive vaccine at local pharmacy or Health Dept. Verbalized acceptance and understanding.  Screening Tests Health Maintenance  Topic Date Due  . FOOT EXAM  Never done  . Fecal DNA (Cologuard)  02/02/2019  . COVID-19 Vaccine (1) 10/09/2020 (Originally 08/12/1955)  . HEMOGLOBIN A1C  10/20/2020  . OPHTHALMOLOGY EXAM  05/21/2021  . MAMMOGRAM  10/24/2021  . DEXA SCAN  08/11/2022  . TETANUS/TDAP  07/11/2023  . INFLUENZA VACCINE  Completed  . Hepatitis C Screening  Completed  . PNA vac Low Risk Adult  Completed    Health Maintenance  Health Maintenance Due  Topic Date Due  . FOOT EXAM  Never done  . Fecal DNA (Cologuard)  02/02/2019    Colorectal cancer screening: Currently due, declined a colonoscopy referral or cologuard order at this time.   Mammogram status: Completed 10/25/19. Repeat every year  Bone Density status: Completed 08/11/17. Results reflect: Bone density results: OSTEOPENIA. Repeat every 5 years.  Lung Cancer Screening: (Low Dose CT Chest recommended if Age 65-80 years, 30 pack-year currently smoking OR have quit w/in 15years.) does not qualify.   Additional Screening:  Hepatitis C Screening: Up to date  Vision  Screening: Recommended annual ophthalmology exams for early detection of glaucoma and other disorders of the eye. Is the patient up to date with their annual eye exam?  Yes  Who is the provider or what is the name of the office in which the patient attends annual eye exams? [facility] If pt is not established with a provider, would they like to be referred to a provider to establish care? No .   Dental Screening: Recommended annual dental exams for proper oral hygiene  Community Resource Referral / Chronic Care Management: CRR required this visit?  No   CCM required this visit?  No      Plan:     I have personally reviewed and noted the following in the patient's chart:   . Medical and social history . Use of alcohol, tobacco or illicit drugs  . Current medications and supplements . Functional ability and status . Nutritional status . Physical activity . Advanced directives . List of other physicians . Hospitalizations, surgeries, and ER visits in previous 12 months . Vitals . Screenings to include cognitive, depression, and falls . Referrals and appointments  In addition, I have reviewed and discussed with patient certain preventive protocols, quality metrics, and best practice recommendations. A written personalized care plan for preventive services as well as general preventive health recommendations were provided to patient.     Perseus Westall [place], [place]   [id number]   Nurse Notes: Pt needs a diabetic foot exam at next in office apt. Pt declined a colonoscopy referral or cologuard order at this time.

## 2020-09-23 ENCOUNTER — Ambulatory Visit (INDEPENDENT_AMBULATORY_CARE_PROVIDER_SITE_OTHER): Payer: Medicare HMO

## 2020-09-23 ENCOUNTER — Other Ambulatory Visit: Payer: Self-pay

## 2020-09-23 DIAGNOSIS — Z0000 Encounter for general adult medical examination without abnormal findings: Secondary | ICD-10-CM | POA: Diagnosis not present

## 2020-09-23 NOTE — Patient Instructions (Signed)
Anna Sweeney , Thank you for taking time to come for your Medicare Wellness Visit. I appreciate your ongoing commitment to your health goals. Please review the following plan we discussed and let me know if I can assist you in the future.   Screening recommendations/referrals: Colonoscopy: Currently due, declined a colonoscopy referral or cologuard order at this time.  Mammogram: Up to date, due 69/2022 Bone Density: Up to date, due 69/2024 Recommended yearly ophthalmology/optometry visit for glaucoma screening and checkup Recommended yearly dental visit for hygiene and checkup  Vaccinations: Influenza vaccine: Done 05/01/20 Pneumococcal vaccine: Completed series Tdap vaccine: Up to date, due 07/2023 Shingles vaccine: Shingrix discussed. Please contact your pharmacy for coverage information.     Advanced directives: Advance directive discussed with you today. Even though you declined this today please call our office should you change your mind and we can give you the proper paperwork for you to fill out.  Conditions/risks identified: Fall risk preventatives discussed today. Recommend to increase water intake to 6-8 8 oz glasses a day.   Next appointment: 10/20/20 @ 8:40 AM with Dr Sherrie Mustache    Preventive Care 65 Years and Older, Female Preventive care refers to lifestyle choices and visits with your health care provider that can promote health and wellness. What does preventive care include?  A yearly physical exam. This is also called an annual well check.  Dental exams once or twice a year.  Routine eye exams. Ask your health care provider how often you should have your eyes checked.  Personal lifestyle choices, including:  Daily care of your teeth and gums.  Regular physical activity.  Eating a healthy diet.  Avoiding tobacco and drug use.  Limiting alcohol use.  Practicing safe sex.  Taking low-dose aspirin every day.  Taking vitamin and mineral supplements as recommended  by your health care provider. What happens during an annual well check? The services and screenings done by your health care provider during your annual well check will depend on your age, overall health, lifestyle risk factors, and family history of disease. Counseling  Your health care provider may ask you questions about your:  Alcohol use.  Tobacco use.  Drug use.  Emotional well-being.  Home and relationship well-being.  Sexual activity.  Eating habits.  History of falls.  Memory and ability to understand (cognition).  Work and work Astronomer.  Reproductive health. Screening  You may have the following tests or measurements:  Height, weight, and BMI.  Blood pressure.  Lipid and cholesterol levels. These may be checked every 5 years, or more frequently if you are over 60 years old.  Skin check.  Lung cancer screening. You may have this screening every year starting at age 69 if you have a 30-pack-year history of smoking and currently smoke or have quit within the past 15 years.  Fecal occult blood test (FOBT) of the stool. You may have this test every year starting at age 69.  Flexible sigmoidoscopy or colonoscopy. You may have a sigmoidoscopy every 5 years or a colonoscopy every 10 years starting at age 69.  Hepatitis C blood test.  Hepatitis B blood test.  Sexually transmitted disease (STD) testing.  Diabetes screening. This is done by checking your blood sugar (glucose) after you have not eaten for a while (fasting). You may have this done every 1-3 years.  Bone density scan. This is done to screen for osteoporosis. You may have this done starting at age 69.  Mammogram. This may be done  every 1-2 years. Talk to your health care provider about how often you should have regular mammograms. Talk with your health care provider about your test results, treatment options, and if necessary, the need for more tests. Vaccines  Your health care provider may  recommend certain vaccines, such as:  Influenza vaccine. This is recommended every year.  Tetanus, diphtheria, and acellular pertussis (Tdap, Td) vaccine. You may need a Td booster every 10 years.  Zoster vaccine. You may need this after age 69.  Pneumococcal 13-valent conjugate (PCV13) vaccine. One dose is recommended after age 69.  Pneumococcal polysaccharide (PPSV23) vaccine. One dose is recommended after age 70. Talk to your health care provider about which screenings and vaccines you need and how often you need them. This information is not intended to replace advice given to you by your health care provider. Make sure you discuss any questions you have with your health care provider. Document Released: 08/22/2015 Document Revised: 04/14/2016 Document Reviewed: 05/27/2015 Elsevier Interactive Patient Education  2017 [place] Prevention in the Home Falls can cause injuries. They can happen to people of all ages. There are many things you can do to make your home safe and to help prevent falls. What can I do on the outside of my home?  Regularly fix the edges of walkways and driveways and fix any cracks.  Remove anything that might make you trip as you walk through a door, such as a raised step or threshold.  Trim any bushes or trees on the path to your home.  Use bright outdoor lighting.  Clear any walking paths of anything that might make someone trip, such as rocks or tools.  Regularly check to see if handrails are loose or broken. Make sure that both sides of any steps have handrails.  Any raised decks and porches should have guardrails on the edges.  Have any leaves, snow, or ice cleared regularly.  Use sand or salt on walking paths during winter.  Clean up any spills in your garage right away. This includes oil or grease spills. What can I do in the bathroom?  Use night lights.  Install grab bars by the toilet and in the tub and shower. Do not use towel  bars as grab bars.  Use non-skid mats or decals in the tub or shower.  If you need to sit down in the shower, use a plastic, non-slip stool.  Keep the floor dry. Clean up any water that spills on the floor as soon as it happens.  Remove soap buildup in the tub or shower regularly.  Attach bath mats securely with double-sided non-slip rug tape.  Do not have throw rugs and other things on the floor that can make you trip. What can I do in the bedroom?  Use night lights.  Make sure that you have a light by your bed that is easy to reach.  Do not use any sheets or blankets that are too big for your bed. They should not hang down onto the floor.  Have a firm chair that has side arms. You can use this for support while you get dressed.  Do not have throw rugs and other things on the floor that can make you trip. What can I do in the kitchen?  Clean up any spills right away.  Avoid walking on wet floors.  Keep items that you use a lot in easy-to-reach places.  If you need to reach something above you, use a  strong step stool that has a grab bar.  Keep electrical cords out of the way.  Do not use floor polish or wax that makes floors slippery. If you must use wax, use non-skid floor wax.  Do not have throw rugs and other things on the floor that can make you trip. What can I do with my stairs?  Do not leave any items on the stairs.  Make sure that there are handrails on both sides of the stairs and use them. Fix handrails that are broken or loose. Make sure that handrails are as long as the stairways.  Check any carpeting to make sure that it is firmly attached to the stairs. Fix any carpet that is loose or worn.  Avoid having throw rugs at the top or bottom of the stairs. If you do have throw rugs, attach them to the floor with carpet tape.  Make sure that you have a light switch at the top of the stairs and the bottom of the stairs. If you do not have them, ask someone to  add them for you. What else can I do to help prevent falls?  Wear shoes that:  Do not have high heels.  Have rubber bottoms.  Are comfortable and fit you well.  Are closed at the toe. Do not wear sandals.  If you use a stepladder:  Make sure that it is fully opened. Do not climb a closed stepladder.  Make sure that both sides of the stepladder are locked into place.  Ask someone to hold it for you, if possible.  Clearly mark and make sure that you can see:  Any grab bars or handrails.  First and last steps.  Where the edge of each step is.  Use tools that help you move around (mobility aids) if they are needed. These include:  Canes.  Walkers.  Scooters.  Crutches.  Turn on the lights when you go into a dark area. Replace any light bulbs as soon as they burn out.  Set up your furniture so you have a clear path. Avoid moving your furniture around.  If any of your floors are uneven, fix them.  If there are any pets around you, be aware of where they are.  Review your medicines with your doctor. Some medicines can make you feel dizzy. This can increase your chance of falling. Ask your doctor what other things that you can do to help prevent falls. This information is not intended to replace advice given to you by your health care provider. Make sure you discuss any questions you have with your health care provider. Document Released: 05/22/2009 Document Revised: 01/01/2016 Document Reviewed: 08/30/2014 Elsevier Interactive Patient Education  2017 [employer].

## 2020-09-27 ENCOUNTER — Other Ambulatory Visit: Payer: Self-pay | Admitting: Family Medicine

## 2020-09-30 DIAGNOSIS — Z79899 Other long term (current) drug therapy: Secondary | ICD-10-CM | POA: Diagnosis not present

## 2020-10-17 ENCOUNTER — Ambulatory Visit
Admission: RE | Admit: 2020-10-17 | Discharge: 2020-10-17 | Disposition: A | Discharge status: Home / Self Care | Payer: Medicare HMO | Source: Ambulatory Visit | Attending: Urology | Admitting: Urology

## 2020-10-17 ENCOUNTER — Other Ambulatory Visit: Payer: Self-pay

## 2020-10-17 DIAGNOSIS — N281 Cyst of kidney, acquired: Secondary | ICD-10-CM | POA: Diagnosis not present

## 2020-10-17 DIAGNOSIS — K7460 Unspecified cirrhosis of liver: Secondary | ICD-10-CM | POA: Diagnosis not present

## 2020-10-17 IMAGING — MR MR ABDOMEN WO/W CM
17 series · 48 of 48 positions shown · IV contrast (6ml Gadavist)
Comparison: Multiple priors including most recent MRI abdomen
[DATE].

CLINICAL DATA: Follow-up right renal cyst.

EXAM:
MRI ABDOMEN WITHOUT AND WITH CONTRAST
TECHNIQUE: Multiplanar multisequence MR imaging of the abdomen was performed
both before and after the administration of intravenous contrast.
CONTRAST:  6mL GADAVIST GADOBUTROL 1 MMOL/ML IV SOLN

[Series 3: cor haste · coronal · 6.0mm · 1.19mm/px · 2 of 32 slices shown]
[im 1/32]
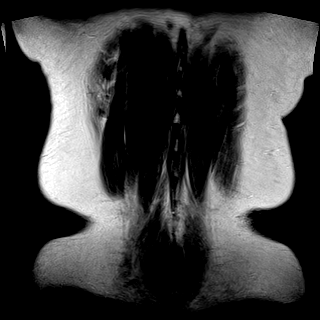
[im 32/32]
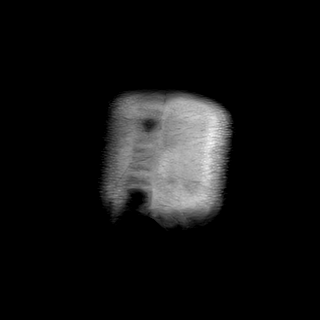

[Series 6: T2 fat-sat · axial · 6.0mm · 1.19mm/px · z∈[-77,+146]mm · 2 of 32 slices shown]
[im 1/32]
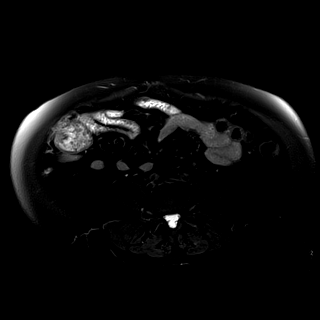
[im 32/32]
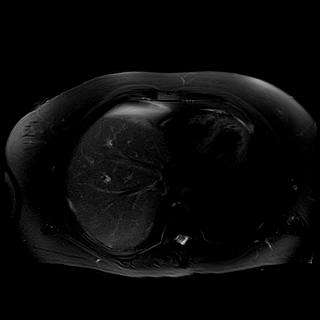

[Series 7: DWI · axial · 6.0mm · 1.42mm/px · z∈[-77,+146]mm · 5 of 96 slices shown (1 of 2)]
[im 1/96]
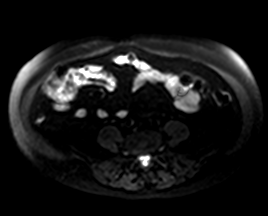
[im 24/96]
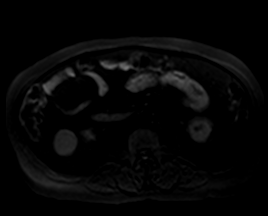
[im 48/96]
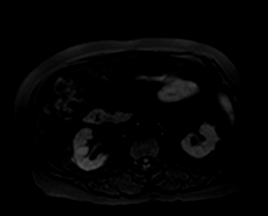
[im 72/96]
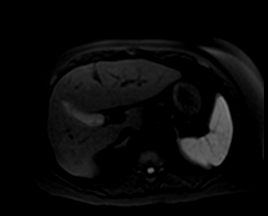
[im 96/96]
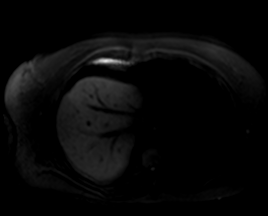

[Series 8: DWI · axial · 6.0mm · 1.42mm/px · 1 of 32 slices shown (2 of 2)]
[im 1/32]
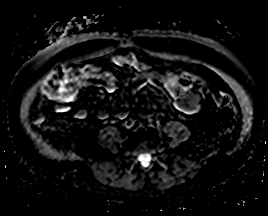

[Series 9: ax in & · axial · 3.0mm · 1.19mm/px · z∈[-72,+141]mm · 6 of 144 slices shown]
[im 1/144]
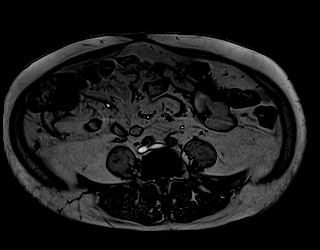
[im 29/144]
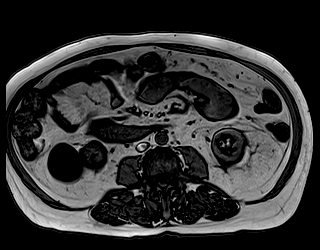
[im 58/144]
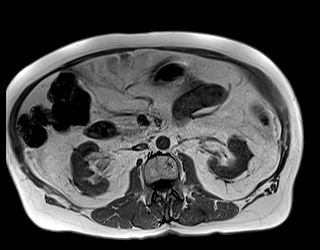
[im 86/144]
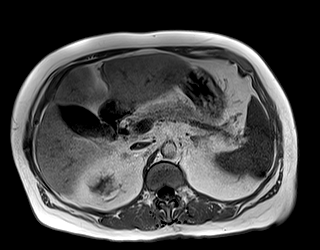
[im 115/144]
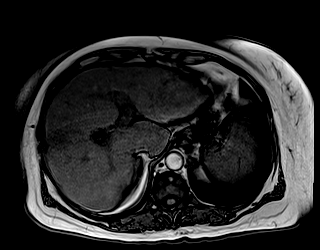
[im 144/144]
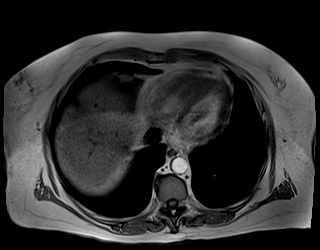

[Series 10: bSSFP · axial · 6.0mm · 0.74mm/px · 1 of 32 slices shown]
[im 1/32]
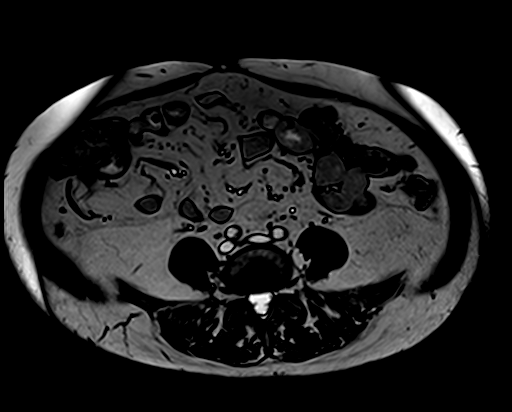

[Series 11: T1 dynamic · axial · non-contrast · 3.0mm · 1.19mm/px · z∈[-72,+141]mm · 3 of 72 slices shown (1 of 9)]
[im 1/72]
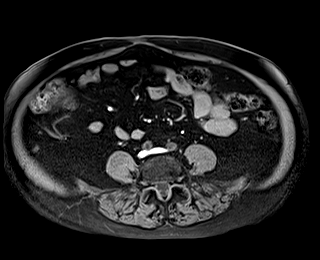
[im 36/72]
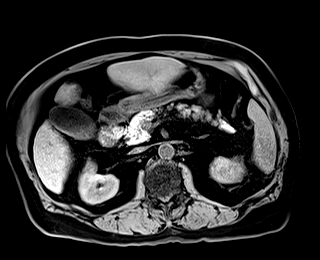
[im 72/72]
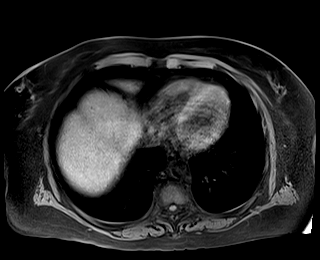

[Series 12: T2 · axial · 6.0mm · 1.19mm/px · 1 of 32 slices shown]
[im 1/32]
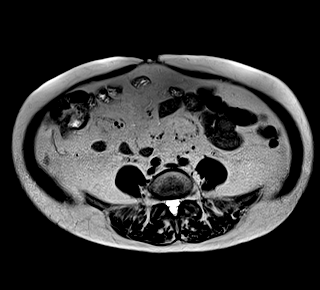

[Series 13: T1 dynamic · axial · 3.0mm · 1.19mm/px · z∈[-72,+141]mm · 3 of 72 slices shown (2 of 9)]
[im 1/72]
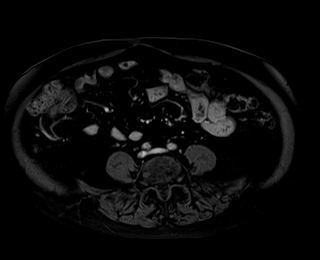
[im 36/72]
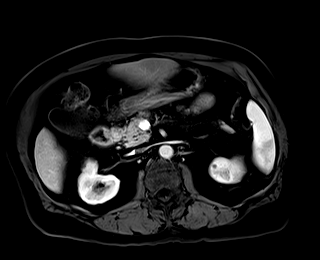
[im 72/72]
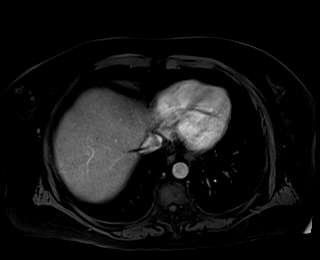

[Series 14: T1 dynamic · axial · 3.0mm · 1.19mm/px · z∈[-72,+141]mm · 3 of 72 slices shown (3 of 9)]
[im 1/72]
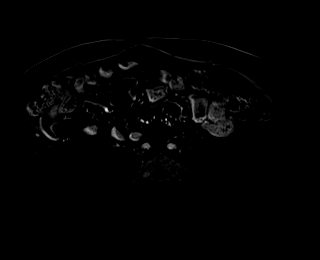
[im 36/72]
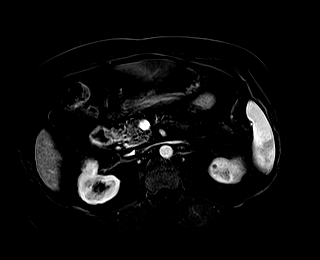
[im 72/72]
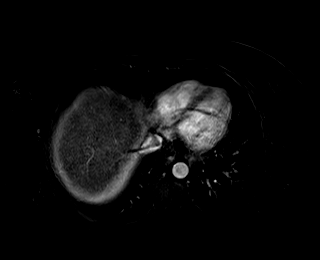

[Series 15: T1 dynamic · axial · 3.0mm · 1.19mm/px · z∈[-72,+141]mm · 3 of 72 slices shown (4 of 9)]
[im 1/72]
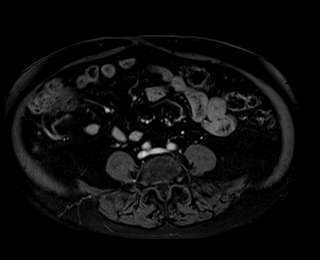
[im 36/72]
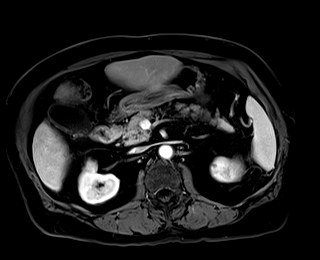
[im 72/72]
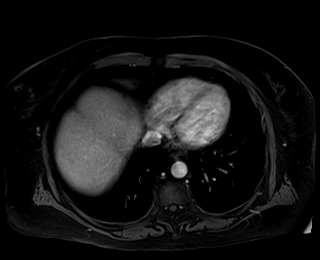

[Series 16: T1 dynamic · axial · 3.0mm · 1.19mm/px · z∈[-72,+141]mm · 3 of 72 slices shown (5 of 9)]
[im 1/72]
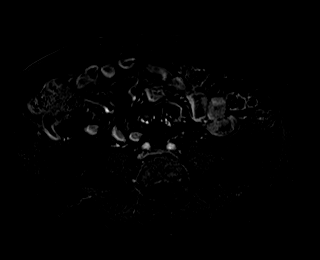
[im 36/72]
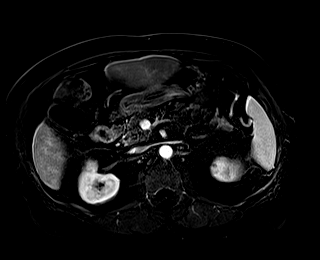
[im 72/72]
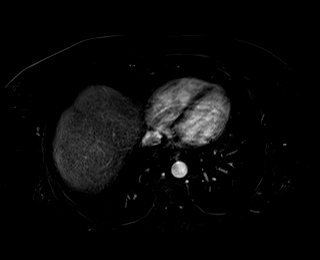

[Series 17: T1 dynamic · axial · 3.0mm · 1.19mm/px · z∈[-72,+141]mm · 3 of 72 slices shown (6 of 9)]
[im 1/72]
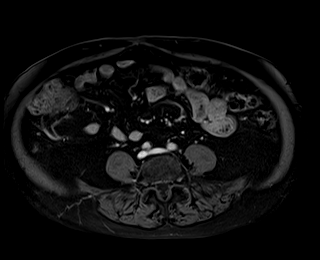
[im 36/72]
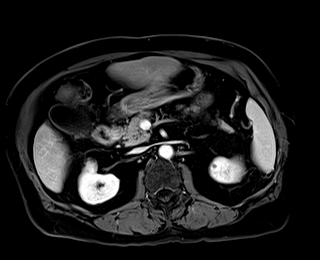
[im 72/72]
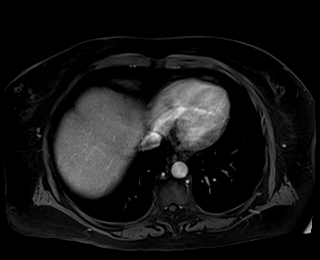

[Series 18: T1 dynamic · axial · 3.0mm · 1.19mm/px · z∈[-72,+141]mm · 3 of 72 slices shown (7 of 9)]
[im 1/72]
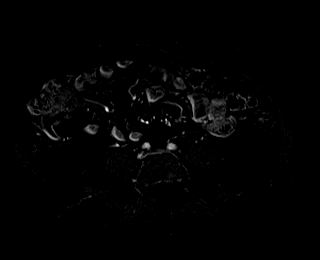
[im 36/72]
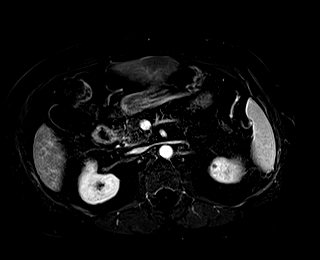
[im 72/72]
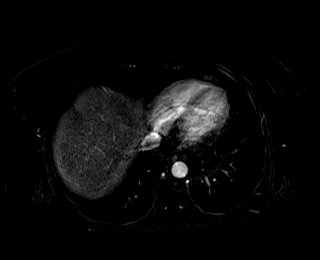

[Series 19: T1 dynamic post-contrast · coronal · 3.0mm · 1.31mm/px · 3 of 80 slices shown]
[im 1/80]
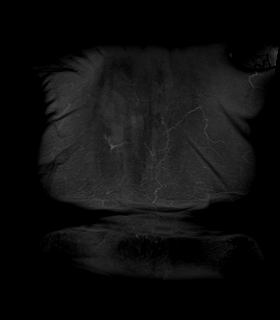
[im 40/80]
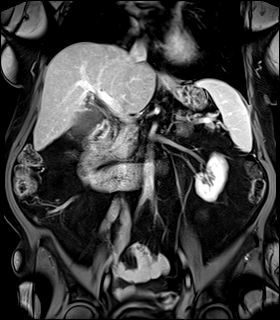
[im 80/80]
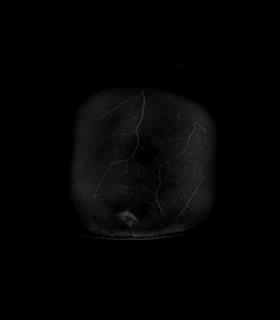

[Series 20: T1 dynamic · axial · 3.0mm · 1.19mm/px · z∈[-72,+141]mm · 3 of 72 slices shown (8 of 9)]
[im 1/72]
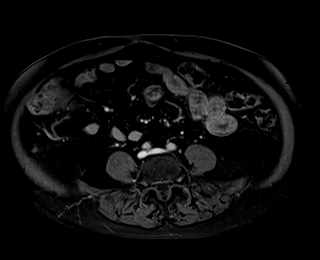
[im 36/72]
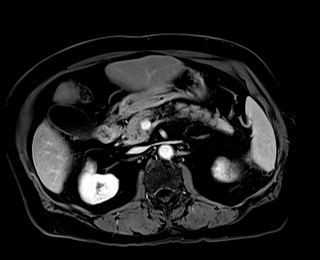
[im 72/72]
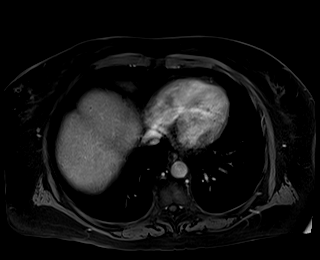

[Series 21: T1 dynamic · axial · 3.0mm · 1.19mm/px · z∈[-72,+141]mm · 3 of 72 slices shown (9 of 9)]
[im 1/72]
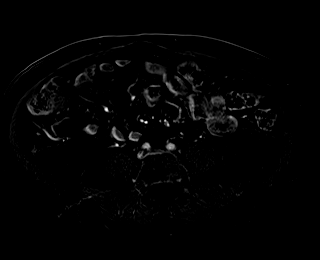
[im 36/72]
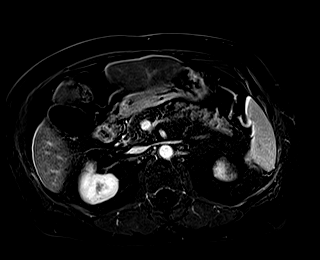
[im 72/72]
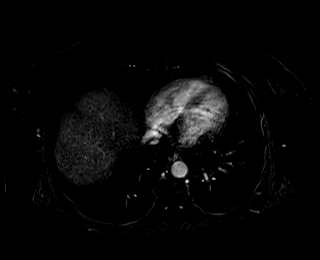

[48 of 48 positions shown; findings below may reference images not displayed]

FINDINGS: Lower chest: No acute abnormality.

Hepatobiliary: Cirrhotic hepatic morphology. No arterially enhancing
hepatic lesion visualized. Gallbladder is unremarkable. No biliary
ductal dilation.

Pancreas: No pancreatic mass or ductal dilation. No pancreatic
divisum.

Spleen:  Unremarkable.

Adrenals/Urinary Tract:  Normal bilateral adrenal glands.

Simple bilateral renal cysts the largest of which is in the lateral
right lower kidney measuring 5 cm. Unchanged size of the 1.6 cm
right interpolar renal cyst which demonstrates a fluid fluid level
with dependent T1 hyperintense fluid consistent with a Bosniak
category 2 hemorrhagic/proteinaceous cyst.

Complex cystic right interpolar renal lesion with multiple thickened
nodular enhancing septations which measures 2.0 x 1.8 cm on image
[DATE] previously 2.0 x 1.7 cm when remeasured for consistency on
image 20/20 of the prior study, consistent with a Bosniak category 3
renal cyst.

Stomach/Bowel: Visualized portions within the abdomen are
unremarkable.

Vascular/Lymphatic: No pathologically enlarged lymph nodes
identified. No abdominal aortic aneurysm demonstrated.

Other:  No abdominal ascites.

Musculoskeletal: No suspicious bone lesions identified.
IMPRESSION: 1. No significant change in size of the Bosniak category 3 complex
cystic right renal lesion. No renal vein tumor thrombus. No findings
of metastatic disease in the abdomen.
2. Cirrhotic hepatic morphology. No arterially enhancing hepatic
lesion or abdominal ascites.

## 2020-10-17 MED ORDER — GADOBUTROL 1 MMOL/ML IV SOLN
6.0000 mL | Freq: Once | INTRAVENOUS | Status: AC | PRN
  Administered 2020-10-17: 6 mL via INTRAVENOUS

## 2020-10-20 ENCOUNTER — Other Ambulatory Visit: Payer: Self-pay

## 2020-10-20 ENCOUNTER — Ambulatory Visit (INDEPENDENT_AMBULATORY_CARE_PROVIDER_SITE_OTHER): Payer: Medicare HMO | Admitting: Family Medicine

## 2020-10-20 VITALS — BP 139/65 | HR 81 | Temp 97.2°F | Ht 66.0 in | Wt 160.4 lb

## 2020-10-20 DIAGNOSIS — N1832 Chronic kidney disease, stage 3b: Secondary | ICD-10-CM

## 2020-10-20 DIAGNOSIS — I10 Essential (primary) hypertension: Secondary | ICD-10-CM

## 2020-10-20 DIAGNOSIS — Z1211 Encounter for screening for malignant neoplasm of colon: Secondary | ICD-10-CM

## 2020-10-20 DIAGNOSIS — Z289 Immunization not carried out for unspecified reason: Secondary | ICD-10-CM

## 2020-10-20 DIAGNOSIS — E559 Vitamin D deficiency, unspecified: Secondary | ICD-10-CM

## 2020-10-20 DIAGNOSIS — E1122 Type 2 diabetes mellitus with diabetic chronic kidney disease: Secondary | ICD-10-CM | POA: Diagnosis not present

## 2020-10-20 DIAGNOSIS — I471 Supraventricular tachycardia: Secondary | ICD-10-CM | POA: Diagnosis not present

## 2020-10-20 DIAGNOSIS — E7800 Pure hypercholesterolemia, unspecified: Secondary | ICD-10-CM

## 2020-10-20 DIAGNOSIS — E119 Type 2 diabetes mellitus without complications: Secondary | ICD-10-CM

## 2020-10-20 LAB — POCT GLYCOSYLATED HEMOGLOBIN (HGB A1C)
Estimated Average Glucose: 146
Hemoglobin A1C: 6.7 % — AB (ref 4.0–5.6)

## 2020-10-20 MED ORDER — SHINGRIX 50 MCG/0.5ML IM SUSR: 0.5000 mL | Freq: Once | INTRAMUSCULAR | 0 refills | Status: AC

## 2020-10-20 NOTE — Patient Instructions (Addendum)
.   Please review the attached list of medications and notify my office if there are any errors.   It is recommended to engage in 150 minutes of moderate exercise every week.    The CDC recommends two doses of Shingrix (the shingles vaccine) separated by 2 to 6 months for adults age 69 years and older. I recommend checking with your pharmacy plan regarding coverage for this vaccine.     . Please call the [facility] [phone]) to schedule a routine screening mammogram.

## 2020-10-20 NOTE — Progress Notes (Signed)
Established patient visit   Patient: Anna Sweeney   DOB: Jan 07, 1951   70 y.o. Female  MRN: [id number] Visit Date: 10/20/2020  Today's healthcare provider: Lelon Huh, MD   Chief Complaint  Patient presents with  . Hypertension  . Diabetes  . Hyperlipidemia   Subjective    HPI  Diabetes Mellitus Type II, Follow-up  Lab Results  Component Value Date   HGBA1C 6.0 (A) 04/22/2020   HGBA1C 7.4 (A) 12/21/2019   HGBA1C 10.5 (H) 09/18/2019   Wt Readings from Last 3 Encounters:  10/20/20 160 lb 6.4 oz (72.8 kg)  04/24/20 152 lb (68.9 kg)  04/22/20 152 lb (68.9 kg)   Last seen for diabetes 6 months ago.  Management since then includes stoping glipizide, but advised to restart at 1/2 table if see sugars over 180. She reports good compliance with treatment. Has not checked her sugar at home, and therefore has not followed glipizide instructions. She is not having side effects.  Symptoms: No fatigue No foot ulcerations  Yes appetite changes No nausea  No paresthesia of the feet  No polydipsia  No polyuria No visual disturbances   No vomiting     Home blood sugar records: n/a  Episodes of hypoglycemia? No    Current insulin regiment: Once a week trulicity Most Recent Eye Exam: 05/21/2020 Current exercise: none Current diet habits: well balanced  Pertinent Labs: Lab Results  Component Value Date   CHOL 176 06/15/2019   HDL 36 (L) 06/15/2019   LDLCALC 99 06/15/2019   TRIG 238 (H) 06/15/2019   CHOLHDL 4.9 (H) 06/15/2019   Lab Results  Component Value Date   NA 136 11/19/2019   K 4.8 11/19/2019   CREATININE 1.68 (H) 11/19/2019   GFRNONAA 31 (L) 11/19/2019   GFRAA 36 (L) 11/19/2019   GLUCOSE 241 (H) 11/19/2019     ---------------------------------------------------------------------------------------------------      Medications: Outpatient Medications Prior to Visit  Medication Sig  . Accu-Chek Softclix Lancets lancets Use to check blood sugar daily  for type 2 diabetes. E11.9  . amLODipine (NORVASC) 5 MG tablet Take 1 tablet (5 mg total) by mouth daily.  Marland Kitchen aspirin EC 81 MG tablet Take 81 mg by mouth daily.  . betamethasone dipropionate (DIPROLENE) 0.05 % cream Apply topically 2 (two) times daily as needed.  . Blood Glucose Monitoring Suppl (ACCU-CHEK AVIVA PLUS) w/Device KIT Use to check blood sugar daily for type 2 diabetes. E11.9  . Cholecalciferol (VITAMIN D-3 PO) Take 1 tablet by mouth daily.  . Clobetasol Prop Emollient Base (CLOBETASOL PROPIONATE E) 0.05 % emollient cream Apply to aa's lower legs BID until clear. Then PRN thereafter. Avoid f/g/a.  . Dulaglutide (TRULICITY) 1.5 NP/0.0FR SOPN INJECT 1.5 MG INTO THE SKIN ONCE A WEEK.  Marland Kitchen glucose blood (ACCU-CHEK AVIVA PLUS) test strip Use to check blood sugar daily for type 2 diabetes. E11.9  . ketoconazole (NIZORAL) 2 % cream APPLY TO AFFECTED AREA EVERY DAY  . lovastatin (MEVACOR) 20 MG tablet TAKE 1 TABLET BY MOUTH (20 MG TOTAL) AT BEDTIME  . metFORMIN (GLUCOPHAGE) 1000 MG tablet Take 1 tablet (1,000 mg total) by mouth 2 (two) times daily.  . trandolapril (MAVIK) 4 MG tablet Take 1 tablet (4 mg total) by mouth daily.  Marland Kitchen triamcinolone cream (KENALOG) 0.5 % Apply 1 application topically 2 (two) times daily.  . verapamil (VERELAN PM) 240 MG 24 hr capsule TAKE 1 CAPSULE (240 MG TOTAL) BY MOUTH AT BEDTIME.  Marland Kitchen doxycycline (  VIBRA-TABS) 100 MG tablet Take 1 tablet (100 mg total) by mouth 2 (two) times daily. (Patient not taking: Reported on 10/20/2020)   No facility-administered medications prior to visit.        Objective    BP 139/65 (BP Location: Right Arm, Patient Position: Sitting, Cuff Size: Normal)   Pulse 81   Temp (!) 97.2 F (36.2 C) (Temporal)   Ht 5' 6"  (1.676 m)   Wt 160 lb 6.4 oz (72.8 kg)   SpO2 100%   BMI 25.89 kg/m     Physical Exam    General: Appearance:     Overweight female in no acute distress  Eyes:    PERRL, conjunctiva/corneas clear, EOM's intact        Lungs:     Clear to auscultation bilaterally, respirations unlabored  Heart:    Normal heart rate. Normal rhythm. No murmurs, rubs, or gallops.   MS:   All extremities are intact.   Neurologic:   Awake, alert, oriented x 3. No apparent focal neurological           defect.         Results for orders placed or performed in visit on 10/20/20  POCT glycosylated hemoglobin (Hb A1C)  Result Value Ref Range   Hemoglobin A1C 6.7 (A) 4.0 - 5.6 %   Estimated Average Glucose 146     Assessment & Plan     1. Primary hypertension Well controlled.  Continue current medications.    2. Type 2 diabetes mellitus without complication, without long-term current use of insulin ([place]) Well controlled.  A1c and weight up somewhat since last visit. She is going to work on increasing physical activity. Continue current medications.  Consider increasing Trulicity of not improved at follow up in about 4 months.   3. SVT (supraventricular tachycardia) (HCC) Very well controlled on current CCB  4. Chronic kidney disease, stage 3b ([place])  5. High cholesterol She is tolerating lovastatin well with no adverse effects.   - CBC - Comprehensive metabolic panel - Lipid panel  6. Vitamin D deficiency  - VITAMIN D 25 Hydroxy (Vit-D Deficiency, Fractures)  7. Colon cancer screening  - Cologuard  8. Prescription for Shingrix. Vaccine not administered in office.   - Zoster Vaccine Adjuvanted [facility]) injection; Inject 0.5 mLs into the muscle once for 1 dose.  Dispense: 0.5 mL; Refill: 0         The entirety of the information documented in the History of Present Illness, Review of Systems and Physical Exam were personally obtained by me. Portions of this information were initially documented by the CMA and reviewed by me for thoroughness and accuracy.      Lelon Huh, MD  [facility] [phone] (phone) [phone] (fax)  [place]

## 2020-10-21 ENCOUNTER — Other Ambulatory Visit: Payer: Self-pay | Admitting: Family Medicine

## 2020-10-21 DIAGNOSIS — Z1231 Encounter for screening mammogram for malignant neoplasm of breast: Secondary | ICD-10-CM

## 2020-10-21 DIAGNOSIS — Z79899 Other long term (current) drug therapy: Secondary | ICD-10-CM | POA: Diagnosis not present

## 2020-10-21 LAB — COMPREHENSIVE METABOLIC PANEL
ALT: 20 IU/L (ref 0–32)
AST: 22 IU/L (ref 0–40)
Albumin/Globulin Ratio: 1.4 (ref 1.2–2.2)
Albumin: 4.5 g/dL (ref 3.8–4.8)
Alkaline Phosphatase: 68 IU/L (ref 44–121)
BUN/Creatinine Ratio: 11 — ABNORMAL LOW (ref 12–28)
BUN: 21 mg/dL (ref 8–27)
Bilirubin Total: 0.3 mg/dL (ref 0.0–1.2)
CO2: 23 mmol/L (ref 20–29)
Calcium: 9.7 mg/dL (ref 8.7–10.3)
Chloride: 99 mmol/L (ref 96–106)
Creatinine, Ser: 1.83 mg/dL — ABNORMAL HIGH (ref 0.57–1.00)
Globulin, Total: 3.3 g/dL (ref 1.5–4.5)
Glucose: 120 mg/dL — ABNORMAL HIGH (ref 65–99)
Potassium: 4.8 mmol/L (ref 3.5–5.2)
Sodium: 139 mmol/L (ref 134–144)
Total Protein: 7.8 g/dL (ref 6.0–8.5)
eGFR: 30 mL/min/{1.73_m2} — ABNORMAL LOW (ref >59)

## 2020-10-21 LAB — CBC
Hematocrit: 37.7 % (ref 34.0–46.6)
Hemoglobin: 12.4 g/dL (ref 11.1–15.9)
MCH: 29.2 pg (ref 26.6–33.0)
MCHC: 32.9 g/dL (ref 31.5–35.7)
MCV: 89 fL (ref 79–97)
Platelets: 230 10*3/uL (ref 150–450)
RBC: 4.24 x10E6/uL (ref 3.77–5.28)
RDW: 13.2 % (ref 11.7–15.4)
WBC: 5.9 10*3/uL (ref 3.4–10.8)

## 2020-10-21 LAB — LIPID PANEL
Chol/HDL Ratio: 4 ratio (ref 0.0–4.4)
Cholesterol, Total: 180 mg/dL (ref 100–199)
HDL: 45 mg/dL (ref >39)
LDL Chol Calc (NIH): 96 mg/dL (ref 0–99)
Triglycerides: 227 mg/dL — ABNORMAL HIGH (ref 0–149)
VLDL Cholesterol Cal: 39 mg/dL (ref 5–40)

## 2020-10-21 LAB — VITAMIN D 25 HYDROXY (VIT D DEFICIENCY, FRACTURES): Vit D, 25-Hydroxy: 27.8 ng/mL — ABNORMAL LOW (ref 30.0–100.0)

## 2020-10-22 ENCOUNTER — Other Ambulatory Visit: Payer: Self-pay

## 2020-10-22 ENCOUNTER — Ambulatory Visit: Payer: Medicare HMO | Admitting: Urology

## 2020-10-22 ENCOUNTER — Encounter: Payer: Self-pay | Admitting: Urology

## 2020-10-22 VITALS — BP 124/71 | HR 105 | Ht 66.0 in | Wt 160.0 lb

## 2020-10-22 DIAGNOSIS — N281 Cyst of kidney, acquired: Secondary | ICD-10-CM | POA: Diagnosis not present

## 2020-10-22 NOTE — Progress Notes (Signed)
10/22/2020 1:18 PM   Anna Sweeney 18-Jan-1951 [id number]  Referring provider: [name], MD [address] [place],  [place] [address]  Chief Complaint  Patient presents with  . Other    Urologic history: 1.  2 cm Bosniak 3 right renal cyst -Elected surveillance   HPI: 70 y.o. female presents for 96-monthfollow-up.   Doing well since her last visit and denies flank, abdominal or pelvic pain  No dysuria or gross hematuria  Renal mass protocol MRI 10/17/2020 with a stable 2.0 x 1.8 cm Bosniak 3 right renal cyst (2.0 x 1.7 on prior scan)  Bilateral simple renal cyst and a 1.6 cm Bosniak 2 right renal cyst   PMH: Past Medical History:  Diagnosis Date  . BRCA negative 11/15/2012   [facility]  . Diabetes mellitus without complication ([place]   . History of chicken pox   . Hypertension     Surgical History: Past Surgical History:  Procedure Laterality Date  . FOOT SURGERY      Home Medications:  Allergies as of 10/22/2020      Reactions   Pioglitazone    Dizziness      Medication List       Accurate as of October 22, 2020  1:18 PM. If you have any questions, ask your nurse or doctor.        Accu-Chek Aviva Plus test strip Generic drug: glucose blood Use to check blood sugar daily for type 2 diabetes. E11.9   Accu-Chek Aviva Plus w/Device Kit Use to check blood sugar daily for type 2 diabetes. E11.9   Accu-Chek Softclix Lancets lancets Use to check blood sugar daily for type 2 diabetes. E11.9   amLODipine 5 MG tablet Commonly known as: NORVASC Take 1 tablet (5 mg total) by mouth daily.   aspirin EC 81 MG tablet Take 81 mg by mouth daily.   betamethasone dipropionate 0.05 % cream Apply topically 2 (two) times daily as needed.   Clobetasol Prop Emollient Base 0.05 % emollient cream Commonly known as: Clobetasol Propionate E Apply to aa's lower legs BID until clear. Then PRN thereafter. Avoid f/g/a.   ketoconazole 2 % cream Commonly  known as: NIZORAL APPLY TO AFFECTED AREA EVERY DAY   lovastatin 20 MG tablet Commonly known as: MEVACOR TAKE 1 TABLET BY MOUTH (20 MG TOTAL) AT BEDTIME   metFORMIN 1000 MG tablet Commonly known as: GLUCOPHAGE Take 1 tablet (1,000 mg total) by mouth 2 (two) times daily.   trandolapril 4 MG tablet Commonly known as: MAVIK Take 1 tablet (4 mg total) by mouth daily.   triamcinolone cream 0.5 % Commonly known as: KENALOG Apply 1 application topically 2 (two) times daily.   Trulicity 1.5 MFH/5.4TGSopn Generic drug: Dulaglutide INJECT 1.5 MG INTO THE SKIN ONCE A WEEK.   verapamil 240 MG 24 hr capsule Commonly known as: VERELAN PM TAKE 1 CAPSULE (240 MG TOTAL) BY MOUTH AT BEDTIME.   VITAMIN D-3 PO Take 1 tablet by mouth daily.       Allergies:  Allergies  Allergen Reactions  . Pioglitazone     Dizziness    Family History: Family History  Problem Relation Age of Onset  . Breast cancer Mother   . Breast cancer Sister   . Cancer Sister        renal     Social History:  reports that she has quit smoking. She has a 4.00 pack-year smoking history. She has never used smokeless tobacco. She  reports that she does not drink alcohol and does not use drugs.   Physical Exam: BP 124/71   Pulse (!) 105   Ht 5' 6"  (1.676 m)   Wt 160 lb (72.6 kg)   BMI 25.82 kg/m   Constitutional:  Alert and oriented, No acute distress. HEENT: [place] AT, moist mucus membranes.  Trachea midline, no masses. Cardiovascular: No clubbing, cyanosis, or edema. Respiratory: Normal respiratory effort, no increased work of breathing.   Assessment & Plan:    1.  Bosniak 3 right renal cyst  Stable without increased size.  For cystic renal masses, we reviewed the Bosniak classification and discussed that Bosniak 3 lesions harbor a 50% chance of malignancy whereas Bosniak 4 cysts have a solid and 90-95% are malignant in nature.   Management options were again reviewed including continued surveillance,  surgical removal and in some cases percutaneous ablation  She would like to continue surveillance.  Has not had ultrasonic imaging of the cyst and will schedule a renal ultrasound in 6 months   Abbie Sons, MD  [facility] [address], [place], [place] [address] [phone]

## 2020-10-23 ENCOUNTER — Encounter: Payer: Self-pay | Admitting: Urology

## 2020-10-27 ENCOUNTER — Encounter: Payer: Self-pay | Admitting: *Deleted

## 2020-10-27 ENCOUNTER — Telehealth: Payer: Self-pay | Admitting: *Deleted

## 2020-10-27 NOTE — Telephone Encounter (Signed)
Pt called back for results. Reviewed result note. Pt verbalizes understanding.  States is taking Vit D and will double the dose as instructed.

## 2020-10-28 DIAGNOSIS — Z1211 Encounter for screening for malignant neoplasm of colon: Secondary | ICD-10-CM | POA: Diagnosis not present

## 2020-10-29 ENCOUNTER — Other Ambulatory Visit: Payer: Self-pay | Admitting: Family Medicine

## 2020-10-29 LAB — COLOGUARD: Cologuard: NEGATIVE

## 2020-11-01 LAB — COLOGUARD: COLOGUARD: NEGATIVE

## 2020-11-11 ENCOUNTER — Other Ambulatory Visit: Payer: Self-pay

## 2020-11-11 ENCOUNTER — Ambulatory Visit
Admission: RE | Admit: 2020-11-11 | Discharge: 2020-11-11 | Disposition: A | Discharge status: Home / Self Care | Payer: Medicare HMO | Source: Ambulatory Visit | Attending: Family Medicine | Admitting: Family Medicine

## 2020-11-11 DIAGNOSIS — Z1231 Encounter for screening mammogram for malignant neoplasm of breast: Secondary | ICD-10-CM | POA: Insufficient documentation

## 2020-11-11 IMAGING — MG MM DIGITAL SCREENING BILAT W/ TOMO AND CAD
8 series · 8 of 24 positions shown · non-contrast
Comparison: Previous exam(s).

CLINICAL DATA: Screening.

EXAM:
DIGITAL SCREENING BILATERAL MAMMOGRAM WITH TOMOSYNTHESIS AND CAD
TECHNIQUE: Bilateral screening digital craniocaudal and mediolateral oblique
mammograms were obtained. Bilateral screening digital breast
tomosynthesis was performed. The images were evaluated with
computer-aided detection.

[R MLO synth-2D]
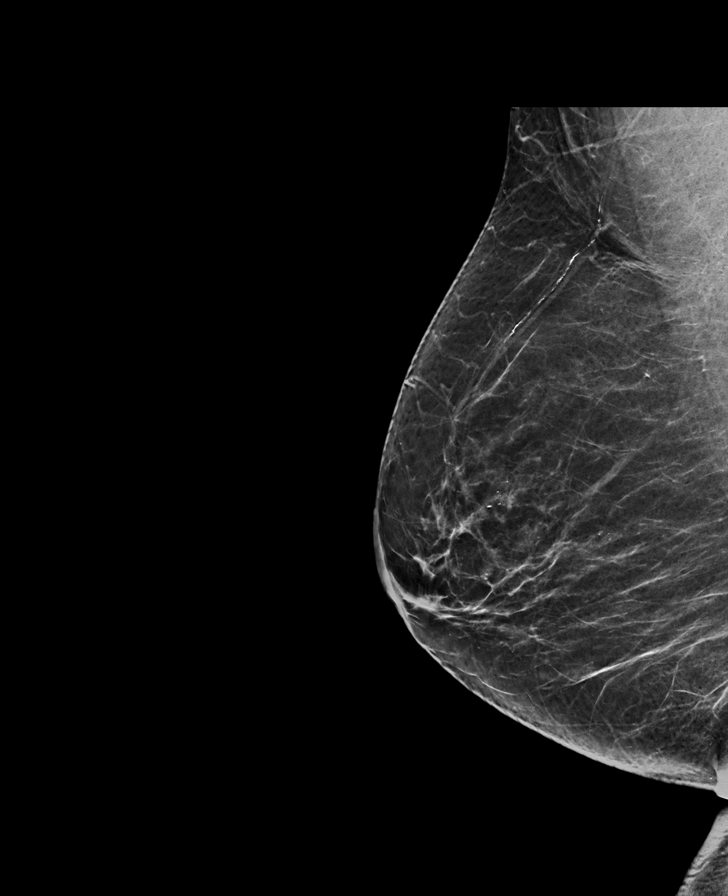

[L MLO synth-2D]
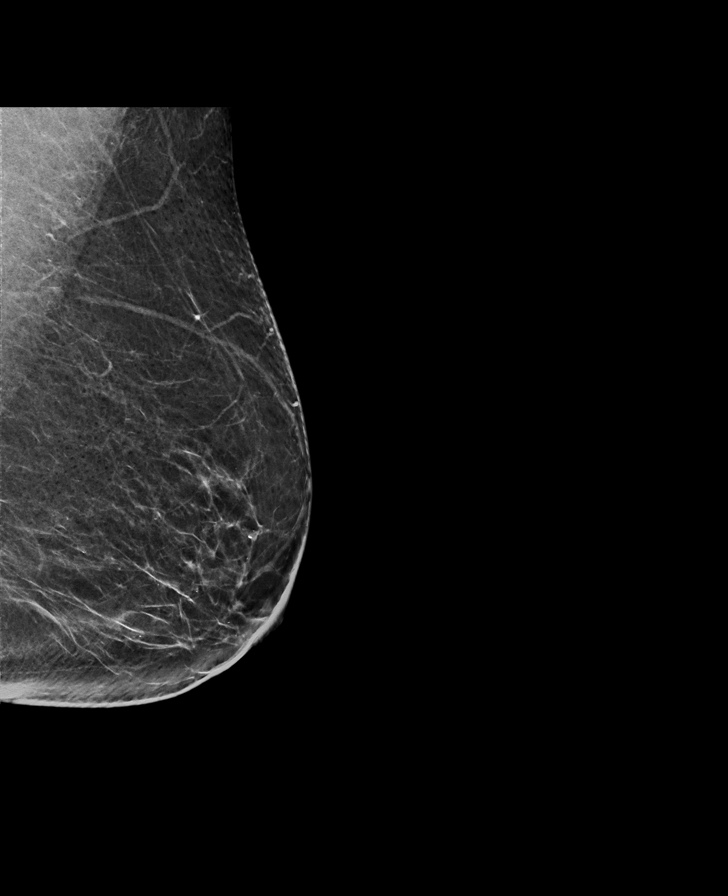

[R CC synth-2D]
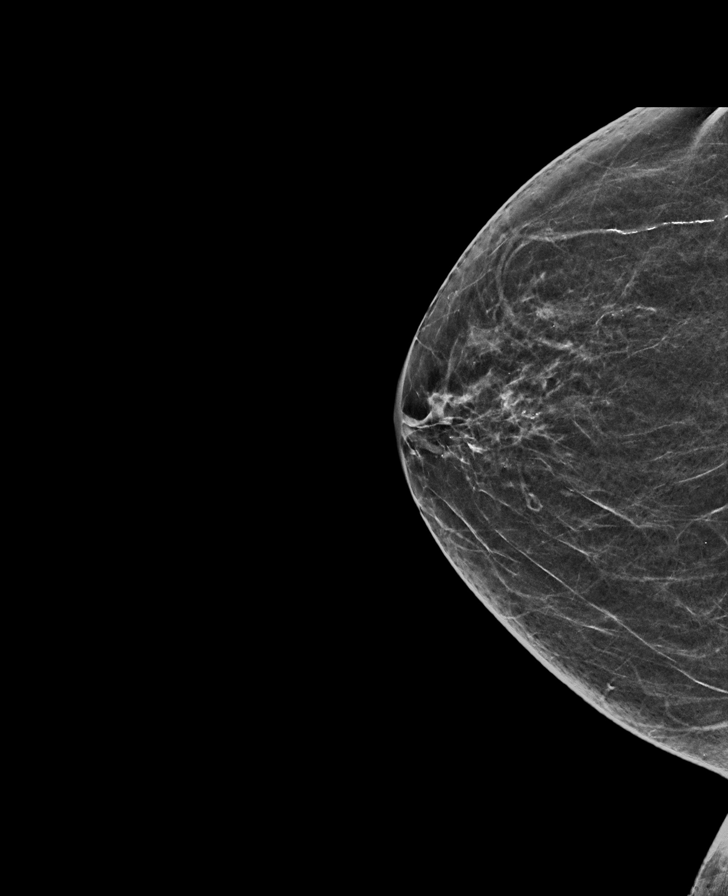

[L CC synth-2D]
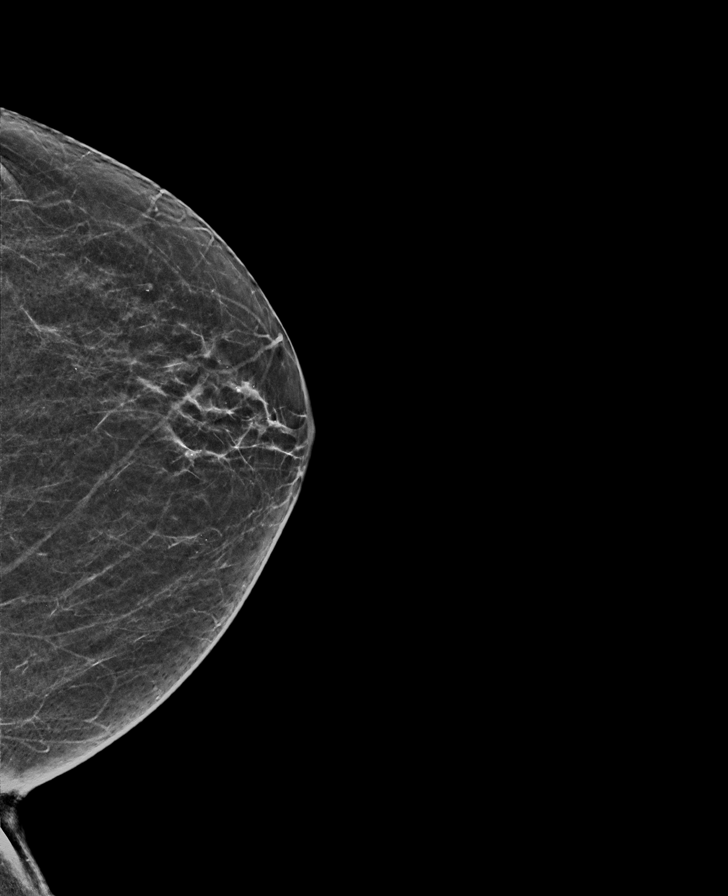

[L CC tomo · tomo slice 27/53.0]
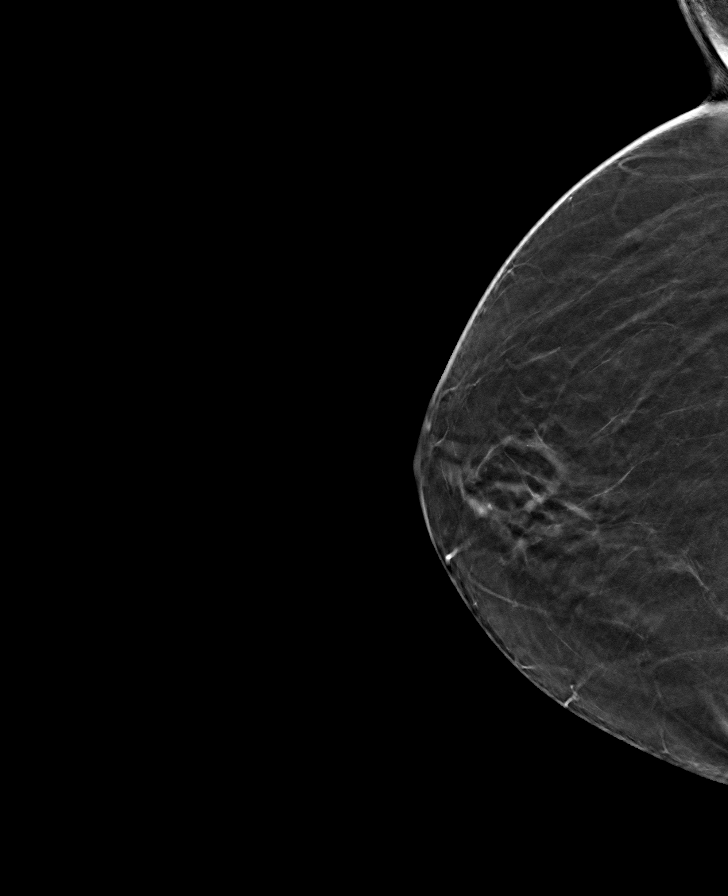

[R MLO tomo · tomo slice 33/65.0]
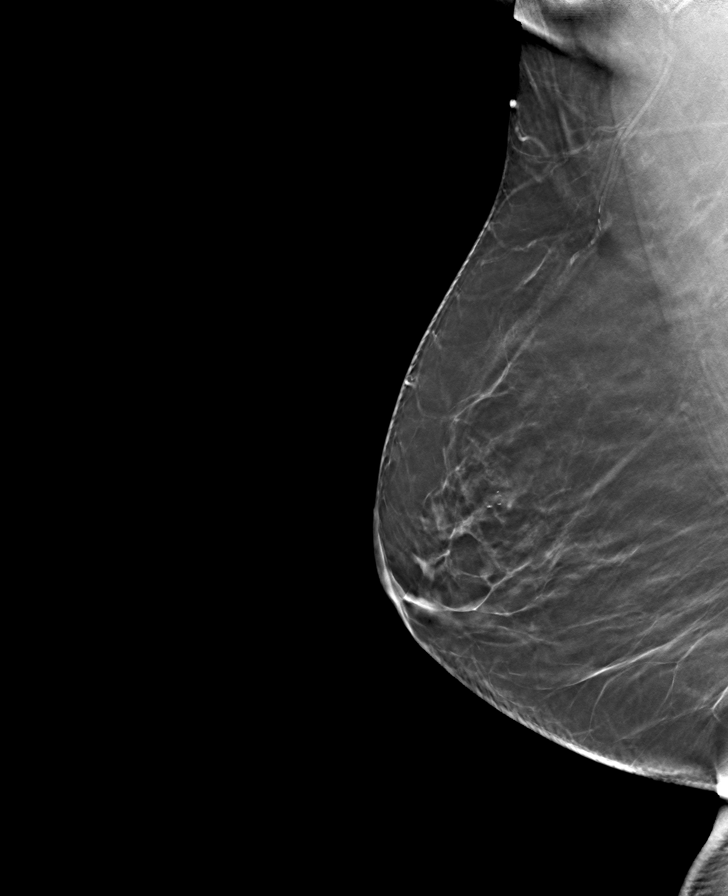

[L MLO tomo · tomo slice 33/64.0]
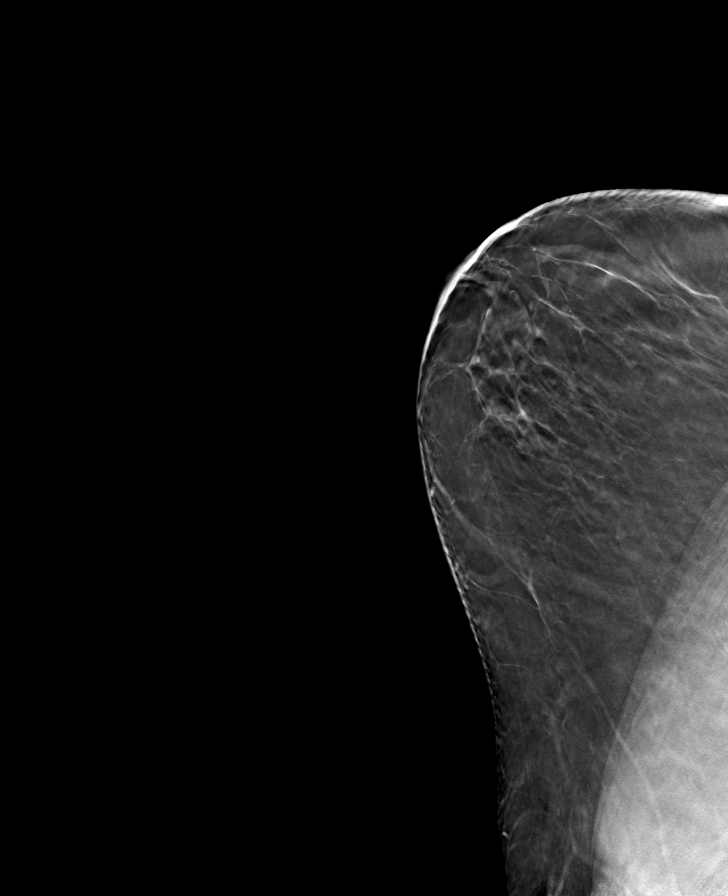

[R CC tomo · tomo slice 29/58.0]
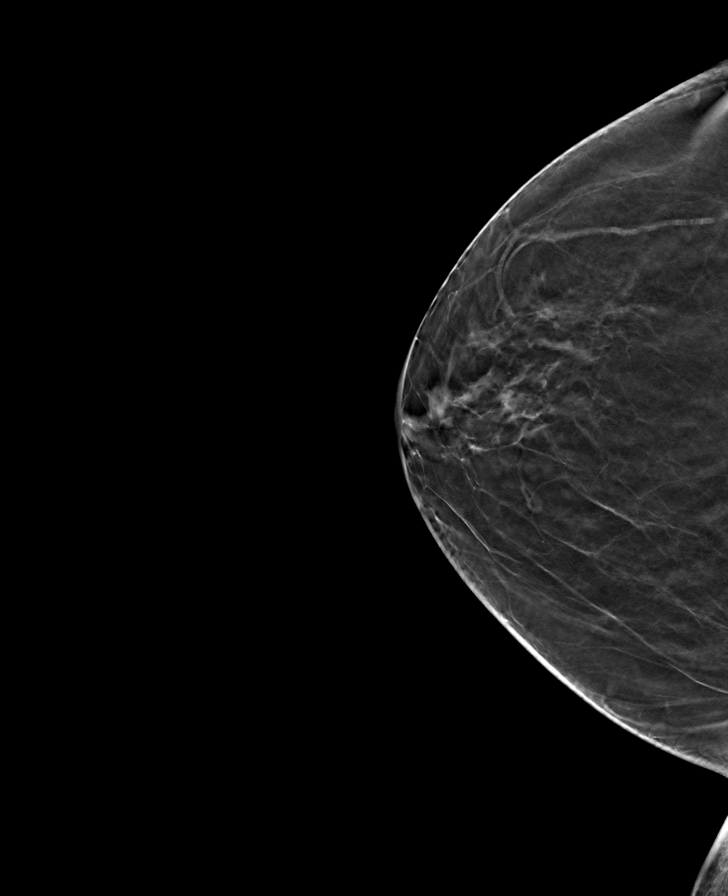

[8 of 24 positions shown; findings below may reference images not displayed]

ACR Breast Density Category b: There are scattered areas of
fibroglandular density.
FINDINGS: There are no findings suspicious for malignancy. The images were
evaluated with computer-aided detection.
IMPRESSION: No mammographic evidence of malignancy. A result letter of this
screening mammogram will be mailed directly to the patient.

RECOMMENDATION:
Screening mammogram in one year. (Code:[OD])

BI-RADS CATEGORY  1: Negative.

## 2020-11-18 ENCOUNTER — Telehealth: Payer: Self-pay

## 2020-11-18 NOTE — Chronic Care Management (AMB) (Signed)
Chronic Care Management Pharmacy Assistant   Name: Anna Sweeney  MRN: [id number] DOB: 10/27/1950  Reason for Encounter: Patient Assistance Coordination   11/18/2020- Patient called regarding [employer] patient assistance medication for Trulicity, missed call, patient left a message stating she is getting low on her medication and hadn't heard from [employer] of medication delivery.  [employer], was transferred to [employer] with the [employer], spoke with Andee Poles, she informed me that they do have patients 0240 application, she was approved Jan 2022 until December of 2022 but the only problem is they have an old prescription from the 9735 application, they will need an updated prescription. I was given the option to call in prescription to get ready for patient delivery. Transferred to [employer], called in Trulicity 1.5 HG/9.9 ml pen to inject 1.5 mg into the skin once a week, 4 month supply with 2 refills to be delivered to patient home. Called patient, no answer, left message of medication renewal and delivery expected to home, patient to call back with any questions. Junius Argyle, CPP notified.   11/20/2020- Left message for patient to return call regarding patient assistance status.  11/25/2020- Received message from CPP that patient medication was received but it came to the providers office instead of being mailed to patients home. Patient aware and will pick up from providers office. Calling [employer] to have delivery address changed. Spoke with Louis A. Representative from [facility], I would need to follow up with [employer] to change delivery address, [facility] pharmacy does not handle that portion of patient assistance application. Transferred to [employer], spoke with [place], she informed me that I would need to send over the patient information of application checking medication to be delivered to home with next  shipment and fax to [employer], form updated and faxed to [employer]. Junius Argyle, CPP notified. Called patient to update regarding next delivery from [employer] will be shipped to home. No answer, left message of information.   Medications: Outpatient Encounter Medications as of 11/18/2020  Medication Sig Note  . verapamil (VERELAN PM) 240 MG 24 hr capsule TAKE 1 CAPSULE (240 MG TOTAL) BY MOUTH AT BEDTIME.   Marland Kitchen Accu-Chek Softclix Lancets lancets Use to check blood sugar daily for type 2 diabetes. E11.9   . amLODipine (NORVASC) 5 MG tablet Take 1 tablet (5 mg total) by mouth daily.   Marland Kitchen aspirin EC 81 MG tablet Take 81 mg by mouth daily.   . betamethasone dipropionate (DIPROLENE) 0.05 % cream Apply topically 2 (two) times daily as needed.   . Blood Glucose Monitoring Suppl (ACCU-CHEK AVIVA PLUS) w/Device KIT Use to check blood sugar daily for type 2 diabetes. E11.9   . Cholecalciferol (VITAMIN D-3 PO) Take 1 tablet by mouth daily.   . Clobetasol Prop Emollient Base (CLOBETASOL PROPIONATE E) 0.05 % emollient cream Apply to aa's lower legs BID until clear. Then PRN thereafter. Avoid f/g/a.   . Dulaglutide (TRULICITY) 1.5 ME/2.6ST SOPN INJECT 1.5 MG INTO THE SKIN ONCE A WEEK.   Marland Kitchen glucose blood (ACCU-CHEK AVIVA PLUS) test strip Use to check blood sugar daily for type 2 diabetes. E11.9   . ketoconazole (NIZORAL) 2 % cream APPLY TO AFFECTED AREA EVERY DAY 09/23/2020: As needed  . lovastatin (MEVACOR) 20 MG tablet TAKE 1 TABLET BY MOUTH (20 MG TOTAL) AT BEDTIME   . metFORMIN (GLUCOPHAGE) 1000 MG tablet Take 1 tablet (1,000 mg total) by mouth  2 (two) times daily.   . trandolapril (MAVIK) 4 MG tablet Take 1 tablet (4 mg total) by mouth daily.   Marland Kitchen triamcinolone cream (KENALOG) 0.5 % Apply 1 application topically 2 (two) times daily. 09/23/2020: As needed   No facility-administered encounter medications on file as of 11/18/2020.    Star Rating Drugs: Trulicity- Last filled 1/69/4503 for 84 day supply at  [facility] Lovastatin 20 mg- Last filled 07/27/2020 for 90 day supply at [facility]. Trandolapril 4 mg- Last filled 09/23/2020 for 90 day supply. Glipizide ER 5 mg- Last filled 05/19/2020 for 90 day supply at [facility]. Metformin 1000 mg- Last filled 08/21/2020 for 90 day supply.   SIG: Anna Sweeney, [place] Pharmacist Assistant [phone]

## 2020-11-20 ENCOUNTER — Other Ambulatory Visit: Payer: Self-pay | Admitting: Family Medicine

## 2020-11-20 DIAGNOSIS — E119 Type 2 diabetes mellitus without complications: Secondary | ICD-10-CM

## 2020-11-25 ENCOUNTER — Telehealth: Payer: Self-pay

## 2020-11-25 NOTE — Telephone Encounter (Signed)
Please review.  I think you have been trying to get her Trulicity delivered.   Thanks,   -Vernona Rieger

## 2020-11-25 NOTE — Telephone Encounter (Signed)
Copied from CRM [id number]. Topic: General - Other >> Nov 25, 2020  8:31 AM Gaetana Michaelis A wrote: Reason for CRM: Patient would like to be contacted regarding medication that was delivered to the practice on their behalf  Patient's Trulicity prescription was supposed to have been dropped off by their OBGYN's office  Please contact to further advise when possible

## 2020-11-25 NOTE — Telephone Encounter (Signed)
Per Maralyn Sago we have picked up the patient's Trulicity and have it here at the clinic. My team is going to call [employer] and update the patient's application to deliver to the patient's home in the future.  Thanks, Angelena Sole, PharmD, CPP Clinical Pharmacist [facility] [phone]

## 2020-11-25 NOTE — Telephone Encounter (Signed)
Patient has made an additional call regarding the matter Patient has disclosed that they will stop by the practice to pick up their medication tomorrow 11/26/20 Please contact to advise further if needed

## 2020-11-25 NOTE — Telephone Encounter (Signed)
Pt wants a call back from someone directly at the office to confirm that her medication is there. Please confirm if its at the office and call back.

## 2020-11-27 ENCOUNTER — Other Ambulatory Visit: Payer: Self-pay | Admitting: Family Medicine

## 2020-11-27 DIAGNOSIS — I10 Essential (primary) hypertension: Secondary | ICD-10-CM

## 2021-01-26 ENCOUNTER — Other Ambulatory Visit: Payer: Self-pay | Admitting: Family Medicine

## 2021-01-31 ENCOUNTER — Other Ambulatory Visit: Payer: Self-pay | Admitting: Family Medicine

## 2021-01-31 DIAGNOSIS — E7800 Pure hypercholesterolemia, unspecified: Secondary | ICD-10-CM

## 2021-02-01 NOTE — Telephone Encounter (Signed)
Requested Prescriptions  Pending Prescriptions Disp Refills  . lovastatin (MEVACOR) 20 MG tablet [Pharmacy Med Name: LOVASTATIN 20 MG Tablet] 90 tablet 2    Sig: TAKE 1 TABLET BY MOUTH (20 MG TOTAL) AT BEDTIME     Cardiovascular:  Antilipid - Statins Failed - 01/31/2021  8:42 PM      Failed - Triglycerides in normal range and within 360 days    Triglycerides  Date Value Ref Range Status  10/20/2020 227 (H) 0 - 149 mg/dL Final         Passed - Total Cholesterol in normal range and within 360 days    Cholesterol, Total  Date Value Ref Range Status  10/20/2020 180 100 - 199 mg/dL Final         Passed - LDL in normal range and within 360 days    LDL Chol Calc (NIH)  Date Value Ref Range Status  10/20/2020 96 0 - 99 mg/dL Final         Passed - HDL in normal range and within 360 days    HDL  Date Value Ref Range Status  10/20/2020 45 >39 mg/dL Final         Passed - Patient is not pregnant      Passed - Valid encounter within last 12 months    Recent Outpatient Visits          3 months ago Primary hypertension   [facility] Malva Limes, MD   9 months ago Type 2 diabetes mellitus without complication, without long-term current use of insulin (HCC)   [facility] Malva Limes, MD   1 year ago Type 2 diabetes mellitus without complication, without long-term current use of insulin [facility])   [facility] Malva Limes, MD   1 year ago Annual physical exam   [facility] Malva Limes, MD   1 year ago Essential hypertension   [facility] Fisher, Demetrios Isaacs, MD      Future Appointments            In 2 weeks Fisher, Demetrios Isaacs, MD [facility]   In 2 months Stoioff, Verna Czech, MD [facility]

## 2021-02-17 ENCOUNTER — Other Ambulatory Visit: Payer: Self-pay

## 2021-02-17 ENCOUNTER — Ambulatory Visit (INDEPENDENT_AMBULATORY_CARE_PROVIDER_SITE_OTHER): Payer: Medicare HMO | Admitting: Family Medicine

## 2021-02-17 ENCOUNTER — Encounter: Payer: Self-pay | Admitting: Family Medicine

## 2021-02-17 VITALS — BP 129/71 | HR 89 | Temp 97.3°F | Resp 16 | Wt 161.0 lb

## 2021-02-17 DIAGNOSIS — N1832 Chronic kidney disease, stage 3b: Secondary | ICD-10-CM

## 2021-02-17 DIAGNOSIS — E119 Type 2 diabetes mellitus without complications: Secondary | ICD-10-CM | POA: Diagnosis not present

## 2021-02-17 DIAGNOSIS — E559 Vitamin D deficiency, unspecified: Secondary | ICD-10-CM

## 2021-02-17 DIAGNOSIS — I10 Essential (primary) hypertension: Secondary | ICD-10-CM

## 2021-02-17 LAB — POCT GLYCOSYLATED HEMOGLOBIN (HGB A1C)
Est. average glucose Bld gHb Est-mCnc: 169
Hemoglobin A1C: 7.5 % — AB (ref 4.0–5.6)

## 2021-02-17 MED ORDER — AMLODIPINE BESYLATE 5 MG PO TABS: 5.0000 mg | ORAL_TABLET | Freq: Every day | ORAL | 4 refills | Status: DC

## 2021-02-17 NOTE — Progress Notes (Addendum)
Established patient visit   Patient: Anna Sweeney   DOB: 12-22-1950   70 y.o. Female  MRN: [id number] Visit Date: 02/17/2021  Today's healthcare provider: Lelon Huh, MD   Chief Complaint  Patient presents with   Diabetes   Hypertension   Subjective    HPI  Diabetes Mellitus Type II, Follow-up  Lab Results  Component Value Date   HGBA1C 7.5 (A) 02/17/2021   HGBA1C 6.7 (A) 10/20/2020   HGBA1C 6.0 (A) 04/22/2020   Wt Readings from Last 3 Encounters:  02/17/21 161 lb (73 kg)  10/22/20 160 lb (72.6 kg)  10/20/20 160 lb 6.4 oz (72.8 kg)   Last seen for diabetes 4 months ago.  Management since then includes advising patient to work on increasing physical activity. Continue current medications.  Consider increasing Trulicity of not improved at follow up in about 4 months. She reports fair compliance with treatment. She is not having side effects.  Symptoms: No fatigue No foot ulcerations  No appetite changes No nausea  No paresthesia of the feet  No polydipsia  No polyuria No visual disturbances   No vomiting     Home blood sugar records:  blood sugars not checked  Episodes of hypoglycemia? No    Current insulin regiment: none Most Recent Eye Exam: 05/21/2020 Current exercise: none Current diet habits: in general, an "unhealthy" diet  Pertinent Labs: Lab Results  Component Value Date   CHOL 180 10/20/2020   HDL 45 10/20/2020   LDLCALC 96 10/20/2020   TRIG 227 (H) 10/20/2020   CHOLHDL 4.0 10/20/2020   Lab Results  Component Value Date   NA 139 10/20/2020   K 4.8 10/20/2020   CREATININE 1.83 (H) 10/20/2020   GFRNONAA 31 (L) 11/19/2019   GFRAA 36 (L) 11/19/2019   GLUCOSE 120 (H) 10/20/2020     ---------------------------------------------------------------------------------------------------   Hypertension, follow-up  BP Readings from Last 3 Encounters:  02/17/21 129/71  10/22/20 124/71  10/20/20 139/65   Wt Readings from Last 3 Encounters:   02/17/21 161 lb (73 kg)  10/22/20 160 lb (72.6 kg)  10/20/20 160 lb 6.4 oz (72.8 kg)     She was last seen for hypertension 4 months ago.  BP at that visit was 139/65. Management since that visit includes advising patient to drink more water due to worsened kidney functions  She reports good compliance with treatment. She is not having side effects.  She is following a Regular diet. She is not exercising. She does not smoke.  Use of agents associated with hypertension: NSAIDS.   Outside blood pressures are not checked. Symptoms: No chest pain No chest pressure  No palpitations No syncope  No dyspnea No orthopnea  No paroxysmal nocturnal dyspnea No lower extremity edema   Pertinent labs: Lab Results  Component Value Date   CHOL 180 10/20/2020   HDL 45 10/20/2020   LDLCALC 96 10/20/2020   TRIG 227 (H) 10/20/2020   CHOLHDL 4.0 10/20/2020   Lab Results  Component Value Date   NA 139 10/20/2020   K 4.8 10/20/2020   CREATININE 1.83 (H) 10/20/2020   GFRNONAA 31 (L) 11/19/2019   GFRAA 36 (L) 11/19/2019   GLUCOSE 120 (H) 10/20/2020     The 10-year ASCVD risk score Mikey Bussing DC Jr., et al., 2013) is: 21.3%   ---------------------------------------------------------------------------------------------------   Vitamin D deficiency, follow-up  Lab Results  Component Value Date   VD25OH 27.8 (L) 10/20/2020   VD25OH 32.9 09/18/2019   VD25OH  19.5 (L) 06/15/2019   CALCIUM 9.7 10/20/2020   CALCIUM 9.9 11/19/2019  ) Wt Readings from Last 3 Encounters:  02/17/21 161 lb (73 kg)  10/22/20 160 lb (72.6 kg)  10/20/20 160 lb 6.4 oz (72.8 kg)    She was last seen for vitamin D deficiency 4 months ago.  Management since that visit includes advising patient to double the dose of Vitamin D. She reports good compliance with treatment. She is not having side effects.   Symptoms: No change in energy level No numbness or tingling  Yes bone pain (pain in tailbone) No unexplained  fracture   ---------------------------------------------------------------------------------------------------       Medications: Outpatient Medications Prior to Visit  Medication Sig   Accu-Chek Softclix Lancets lancets Use to check blood sugar daily for type 2 diabetes. E11.9   amLODipine (NORVASC) 5 MG tablet Take 1 tablet (5 mg total) by mouth daily.   aspirin EC 81 MG tablet Take 81 mg by mouth daily.   betamethasone dipropionate (DIPROLENE) 0.05 % cream Apply topically 2 (two) times daily as needed.   Blood Glucose Monitoring Suppl (ACCU-CHEK AVIVA PLUS) w/Device KIT Use to check blood sugar daily for type 2 diabetes. E11.9   Cholecalciferol (VITAMIN D-3 PO) Take 2 tablets by mouth daily.   Clobetasol Prop Emollient Base (CLOBETASOL PROPIONATE E) 0.05 % emollient cream Apply to aa's lower legs BID until clear. Then PRN thereafter. Avoid f/g/a.   Dulaglutide (TRULICITY) 1.5 PR/9.4VO SOPN INJECT 1.5 MG INTO THE SKIN ONCE A WEEK.   glucose blood (ACCU-CHEK AVIVA PLUS) test strip Use to check blood sugar daily for type 2 diabetes. E11.9   ketoconazole (NIZORAL) 2 % cream APPLY TO AFFECTED AREA EVERY DAY   lovastatin (MEVACOR) 20 MG tablet TAKE 1 TABLET BY MOUTH (20 MG TOTAL) AT BEDTIME   metFORMIN (GLUCOPHAGE) 1000 MG tablet TAKE 1 TABLET (1,000 MG TOTAL) BY MOUTH 2 (TWO) TIMES DAILY.   trandolapril (MAVIK) 4 MG tablet TAKE 1 TABLET EVERY DAY   triamcinolone cream (KENALOG) 0.5 % Apply 1 application topically 2 (two) times daily.   verapamil (VERELAN PM) 240 MG 24 hr capsule TAKE 1 CAPSULE (240 MG TOTAL) BY MOUTH AT BEDTIME.   No facility-administered medications prior to visit.    Review of Systems  Constitutional:  Negative for appetite change, chills, fatigue and fever.  Respiratory:  Negative for chest tightness and shortness of breath.   Cardiovascular:  Negative for chest pain and palpitations.  Gastrointestinal:  Positive for diarrhea (occasional explosive diarrhea). Negative  for abdominal pain, nausea and vomiting.  Musculoskeletal:  Positive for arthralgias (pain in tailbone).  Neurological:  Negative for dizziness and weakness.      Objective    BP 129/71 (BP Location: Right Arm, Patient Position: Sitting, Cuff Size: Normal)   Pulse 89   Temp (!) 97.3 F (36.3 C) (Temporal)   Resp 16   Wt 161 lb (73 kg)   BMI 25.99 kg/m     Physical Exam   General: Appearance:     Overweight female in no acute distress  Eyes:    PERRL, conjunctiva/corneas clear, EOM's intact       Lungs:     Clear to auscultation bilaterally, respirations unlabored  Heart:    Normal heart rate. Normal rhythm. No murmurs, rubs, or gallops.   MS:   All extremities are intact.   Neurologic:   Awake, alert, oriented x 3. No apparent focal neurological defect.  Results for orders placed or performed in visit on 02/17/21  POCT HgB A1C  Result Value Ref Range   Hemoglobin A1C 7.5 (A) 4.0 - 5.6 %   Est. average glucose Bld gHb Est-mCnc 169     Assessment & Plan     1. Type 2 diabetes mellitus without complication, without long-term current use of insulin (HCC) A1c is steadily increasing. Discussed dietary improvements and increasing exercise. Consider increasing Trulicity if M8U continues to rise at follow up.   2. Essential hypertension Well controlled.  refill amLODipine (NORVASC) 5 MG tablet; Take 1 tablet (5 mg total) by mouth daily.  Dispense: 90 tablet; Refill: 4  3. Vitamin D deficiency Is now taking vitamin supplements every day.  - VITAMIN D 25 Hydroxy (Vit-D Deficiency, Fractures)  4. Chronic kidney disease, stage 3b ([place]) Consider reducing metformin if not improved.  - Renal function panel - Parathyroid hormone, intact (no Ca)   Future Appointments  Date Time Provider [place]  06/23/2021 10:20 AM Caryn Section Kirstie Peri, MD [facility]         The entirety of the information documented in the History of Present Illness, Review of Systems and  Physical Exam were personally obtained by me. Portions of this information were initially documented by the CMA and reviewed by me for thoroughness and accuracy.     Lelon Huh, MD  [facility] [phone] (phone) [phone] (fax)  [place]

## 2021-02-17 NOTE — Patient Instructions (Signed)
Please review the attached list of medications and notify my office if there are any errors.  ? ?Please bring all of your medications to every appointment so we can make sure that our medication list is the same as yours.  ? ?Please go to the lab draw station in [address] on the second floor of [facility]. Normal hours are 8:00am to 11:30am and 1:00pm to 4:00pm Monday through Friday  ?

## 2021-02-18 DIAGNOSIS — E559 Vitamin D deficiency, unspecified: Secondary | ICD-10-CM | POA: Diagnosis not present

## 2021-02-18 DIAGNOSIS — N1832 Chronic kidney disease, stage 3b: Secondary | ICD-10-CM | POA: Diagnosis not present

## 2021-02-19 LAB — RENAL FUNCTION PANEL
Albumin: 4.8 g/dL (ref 3.8–4.8)
BUN/Creatinine Ratio: 14 (ref 12–28)
BUN: 25 mg/dL (ref 8–27)
CO2: 19 mmol/L — ABNORMAL LOW (ref 20–29)
Calcium: 9.9 mg/dL (ref 8.7–10.3)
Chloride: 101 mmol/L (ref 96–106)
Creatinine, Ser: 1.74 mg/dL — ABNORMAL HIGH (ref 0.57–1.00)
Glucose: 141 mg/dL — ABNORMAL HIGH (ref 65–99)
Phosphorus: 4.2 mg/dL (ref 3.0–4.3)
Potassium: 5 mmol/L (ref 3.5–5.2)
Sodium: 140 mmol/L (ref 134–144)
eGFR: 31 mL/min/{1.73_m2} — ABNORMAL LOW (ref >59)

## 2021-02-19 LAB — VITAMIN D 25 HYDROXY (VIT D DEFICIENCY, FRACTURES): Vit D, 25-Hydroxy: 89.5 ng/mL (ref 30.0–100.0)

## 2021-02-19 LAB — PARATHYROID HORMONE, INTACT (NO CA): PTH: 19 pg/mL (ref 15–65)

## 2021-02-20 ENCOUNTER — Other Ambulatory Visit: Payer: Self-pay | Admitting: Family Medicine

## 2021-02-20 DIAGNOSIS — E7800 Pure hypercholesterolemia, unspecified: Secondary | ICD-10-CM

## 2021-02-24 ENCOUNTER — Telehealth: Payer: Self-pay | Admitting: Family Medicine

## 2021-02-24 NOTE — Telephone Encounter (Signed)
[facility] faxed refill request for the following medications  OPEN A ERROR   Please advise

## 2021-02-26 ENCOUNTER — Telehealth: Payer: Self-pay

## 2021-02-26 NOTE — Telephone Encounter (Signed)
Copied from CRM [id number]. Topic: General - Other >> Feb 26, 2021  8:13 AM Wyonia Hough E wrote: Reason for CRM: Pt called about her Trulicity / she stated it was showing as delivered to your office yesterday/ please call when delivered

## 2021-03-02 NOTE — Telephone Encounter (Signed)
Tried calling patient. Left detailed message on home voice message system that our office has not received any samples labeled with her name for Trulicity (ok per DPR to leave message on home voice message system).

## 2021-03-17 DIAGNOSIS — Z79899 Other long term (current) drug therapy: Secondary | ICD-10-CM | POA: Diagnosis not present

## 2021-04-15 ENCOUNTER — Ambulatory Visit: Payer: Self-pay | Admitting: *Deleted

## 2021-04-15 ENCOUNTER — Telehealth: Payer: Self-pay | Admitting: Family Medicine

## 2021-04-15 DIAGNOSIS — E119 Type 2 diabetes mellitus without complications: Secondary | ICD-10-CM

## 2021-04-15 MED ORDER — METFORMIN HCL 1000 MG PO TABS: 500.0000 mg | ORAL_TABLET | Freq: Two times a day (BID) | ORAL | Status: DC

## 2021-04-15 NOTE — Telephone Encounter (Signed)
Calls with 2 episodes of dizziness and cbg 2 hours after meals at 200-250.These have occurred since the decrease of Metformin from 1000 mg twice/day to 500 mg twice daily on 02/17/21.Denies SOB/CP/blurred vision/HA. Does not check fasting cbg. Advised fasting cbg for two weeks and journal those along with any symptoms. Decrease sugars and carbohydrates, increase water intake to 4-5 glasses daily-only 2 glasses now. Reviewed s/sx when to seek immediate treatment.  Patient asking if she should increase her Metformin dosage.    Reason for Disposition  Blood glucose 70-240 mg/dL (3.9 -79.0 mmol/L)  Answer Assessment - Initial Assessment Questions 1. BLOOD GLUCOSE: "What is your blood glucose level?"      159 this morning 2. ONSET: "When did you check the blood glucose?"     This morning 3. USUAL RANGE: "What is your glucose level usually?" (e.g., usual fasting morning value, usual evening value)     Less than 150 4. KETONES: "Do you check for ketones (urine or blood test strips)?" If yes, ask: "What does the test show now?"      no 5. TYPE 1 or 2:  "Do you know what type of diabetes you have?"  (e.g., Type 1, Type 2, Gestational; doesn't know)      Type 2 6. INSULIN: "Do you take insulin?" "What type of insulin(s) do you use? What is the mode of delivery? (syringe, pen; injection or pump)?"      No insulin 7. DIABETES PILLS: "Do you take any pills for your diabetes?" If yes, ask: "Have you missed taking any pills recently?"     Trulicity and oral 8. OTHER SYMPTOMS: "Do you have any symptoms?" (e.g., fever, frequent urination, difficulty breathing, dizziness, weakness, vomiting)     Dizziness  9. PREGNANCY: "Is there any chance you are pregnant?" "When was your last menstrual period?"     na  Protocols used: Diabetes - High Blood Sugar-A-AH

## 2021-04-15 NOTE — Telephone Encounter (Signed)
Patient called, left VM to return the call to the office for a message from Dr. Sherrie Mustache.  Note Copied from CRM [id number]. Topic: General - Other >> Apr 15, 2021  1:44 PM Marylen Ponto wrote: Reason for CRM: Pt returned call. Attempted to transfer pt to NT to inform pt of provider's message but there was no answer. Pt requests call back.

## 2021-04-15 NOTE — Telephone Encounter (Signed)
LMTCB 04/25/2021.  [facility] please advise pt as below.   Thanks,   -Vernona Rieger

## 2021-04-15 NOTE — Telephone Encounter (Signed)
No, she should not take more the 500mg  twice a day. She can increase Trulicity to 3mg  a week. Is she has several of the 1.5mg  pens, then she take 1.5mg  twice a week until gone and let me know a week before running out so we can sent in prescription for 3mg  pens.

## 2021-04-15 NOTE — Addendum Note (Signed)
Addended by: Malva Limes on: 04/15/2021 12:27 PM   Modules accepted: Orders

## 2021-04-15 NOTE — Telephone Encounter (Signed)
See triage encounter.

## 2021-04-15 NOTE — Telephone Encounter (Signed)
Copied from CRM [id number]. Topic: General - Other >> Apr 15, 2021  1:44 PM Marylen Ponto wrote: Reason for CRM: Pt returned call. Attempted to transfer pt to NT to inform pt of provider's message but there was no answer. Pt requests call back.

## 2021-04-16 NOTE — Telephone Encounter (Signed)
Patient returned call and advised: No, she should not take more the 500mg  twice a day. She can increase Trulicity to 3mg  a week. Is she has several of the 1.5mg  pens, then she take 1.5mg  twice a week until gone and let me know a week before running out so we can sent in prescription for 3mg  pens.   Patient state she has 15 injections on hand- will start the double dosing until she runs out. Patient will contact PCP when she needs RF at increased dosing. Patient will also keep record of glucose levels and reach out if needed.

## 2021-04-16 NOTE — Telephone Encounter (Signed)
FYI

## 2021-04-16 NOTE — Telephone Encounter (Signed)
Patient called in to inform Dr Sherrie Mustache that she spoke to someone at [facility] and they are faxing over paper work to be completed and returned.

## 2021-04-17 DIAGNOSIS — Z79899 Other long term (current) drug therapy: Secondary | ICD-10-CM | POA: Diagnosis not present

## 2021-04-17 NOTE — Telephone Encounter (Signed)
Left message to call back. Ok for [facility] to advise as below.  

## 2021-04-17 NOTE — Telephone Encounter (Signed)
Patient has been notified of provider instructions- she has contacted her pharmacy provider and they will be sending paperwork for updated dosing on medication- see phone note.

## 2021-04-17 NOTE — Telephone Encounter (Signed)
FYI

## 2021-04-22 ENCOUNTER — Telehealth: Payer: Self-pay

## 2021-04-22 DIAGNOSIS — E119 Type 2 diabetes mellitus without complications: Secondary | ICD-10-CM

## 2021-04-22 NOTE — Telephone Encounter (Signed)
-----   Message from Gaspar Cola, [facility] sent at 04/21/2021  2:43 PM EDT ----- Regarding: CCM Referral to Pharmacy Hello,  I received patient assistance documentation for Ms. Anna Sweeney. Can we please place a referral to CCM for pharmacy services so I can get an appointment scheduled with her?   Thanks, Angelena Sole, PharmD, [place]y [place], CPP  Clinical Pharmacist [facility] [phone]

## 2021-04-22 NOTE — Telephone Encounter (Signed)
Dr. Sherrie Mustache, do you know the name of the Pharmacy referral? I tried using [id number] but the referral that came up didn't look like the correct one.

## 2021-04-23 ENCOUNTER — Telehealth: Payer: Self-pay | Admitting: *Deleted

## 2021-04-23 NOTE — Chronic Care Management (AMB) (Signed)
  Chronic Care Management   Note  04/23/2021 Name: Cheryal Salas MRN: [id number] DOB: Dec 27, 1950  Dailey Alberson is a 70 y.o. year old female who is a primary care patient of Fisher, Kirstie Peri, MD. I reached out to [name] by phone today in response to a referral sent by Ms. Hilda Blades N[place]'s PCP [name], MD     Ms. Peto was given information about Chronic Care Management services today including:  CCM service includes personalized support from designated clinical staff supervised by her physician, including individualized plan of care and coordination with other care providers 24/7 contact phone numbers for assistance for urgent and routine care needs. Service will only be billed when office clinical staff spend 20 minutes or more in a month to coordinate care. Only one practitioner may furnish and bill the service in a calendar month. The patient may stop CCM services at any time (effective at the end of the month) by phone call to the office staff. The patient will be responsible for cost sharing (co-pay) of up to 20% of the service fee (after annual deductible is met).  Patient agreed to services and verbal consent obtained.   Follow up plan: Telephone appointment with care management team member scheduled for: 05/19/2021  Julian Hy, [facility]  Direct Dial: [phone]

## 2021-04-24 ENCOUNTER — Ambulatory Visit: Payer: Self-pay | Admitting: Urology

## 2021-04-29 ENCOUNTER — Other Ambulatory Visit: Payer: Self-pay | Admitting: Family Medicine

## 2021-04-29 ENCOUNTER — Ambulatory Visit: Payer: Self-pay | Admitting: Urology

## 2021-05-07 ENCOUNTER — Ambulatory Visit
Admission: RE | Admit: 2021-05-07 | Discharge: 2021-05-07 | Disposition: A | Discharge status: Home / Self Care | Payer: Medicare HMO | Source: Ambulatory Visit | Attending: Urology | Admitting: Urology

## 2021-05-07 ENCOUNTER — Other Ambulatory Visit: Payer: Self-pay

## 2021-05-07 DIAGNOSIS — N281 Cyst of kidney, acquired: Secondary | ICD-10-CM | POA: Diagnosis not present

## 2021-05-07 IMAGING — US US RENAL
1 series · 14 of 25 positions shown · non-contrast
Comparison: [DATE]

CLINICAL DATA: Complex renal cyst

EXAM:
RENAL / URINARY TRACT ULTRASOUND COMPLETE

[Series 1: us renal · 14 of 33 slices shown]
[im 1/33]
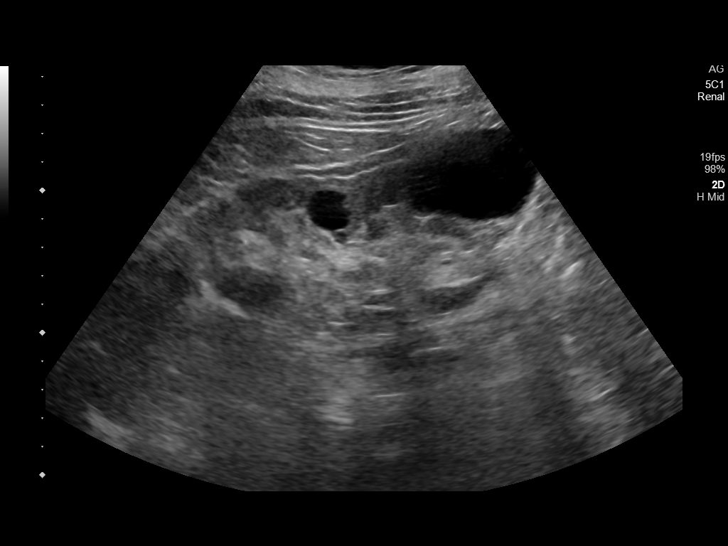
[im 3/33]
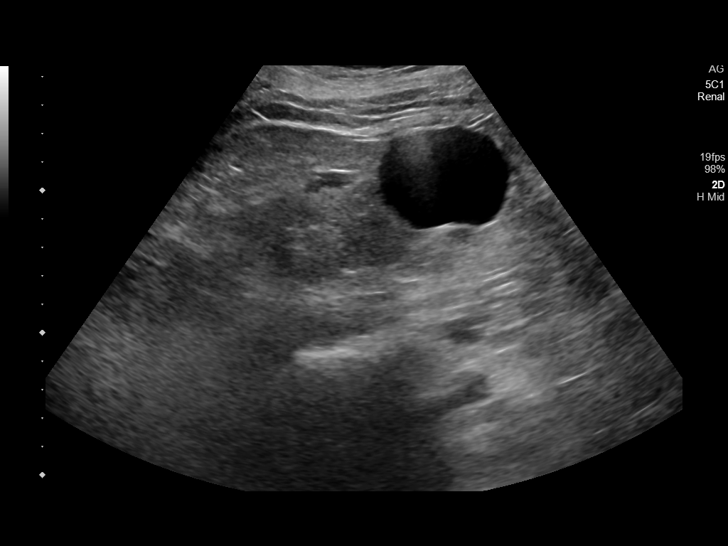
[im 6/33]
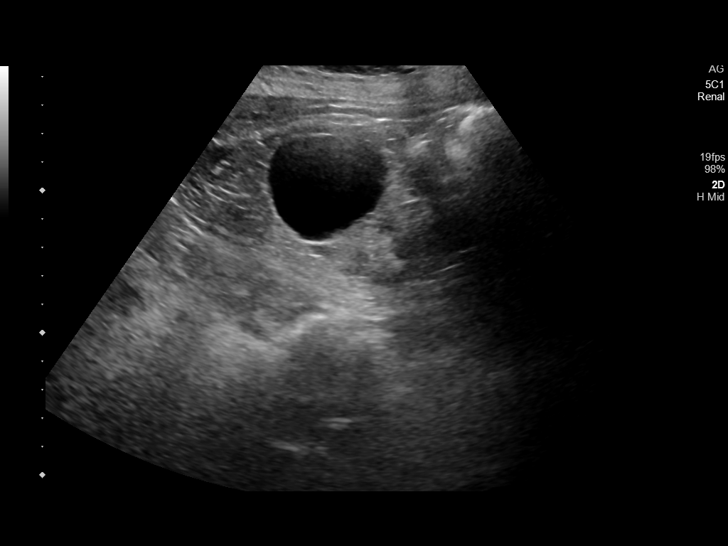
[im 9/33]
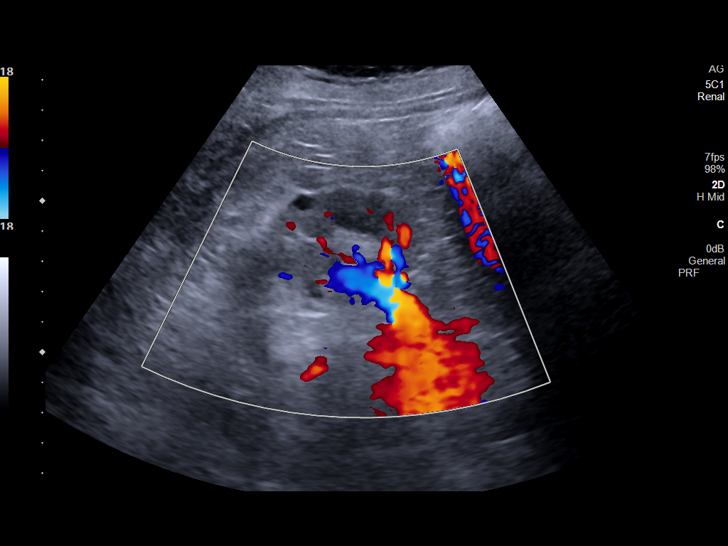
[im 11/33]
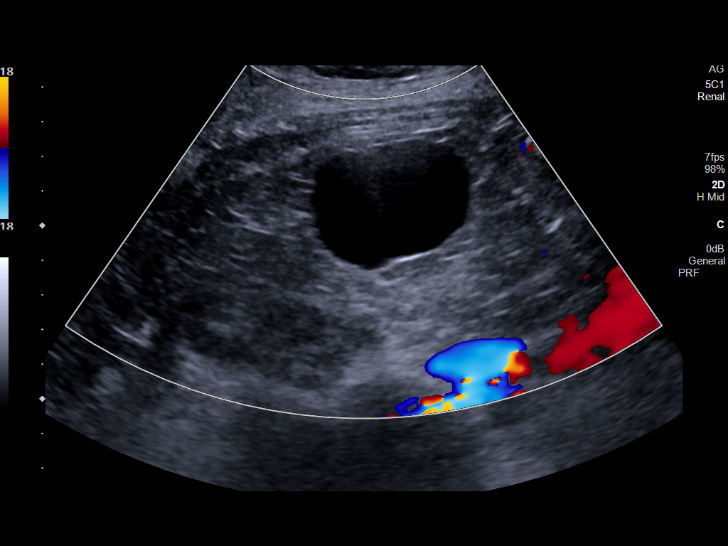
[im 13/33]
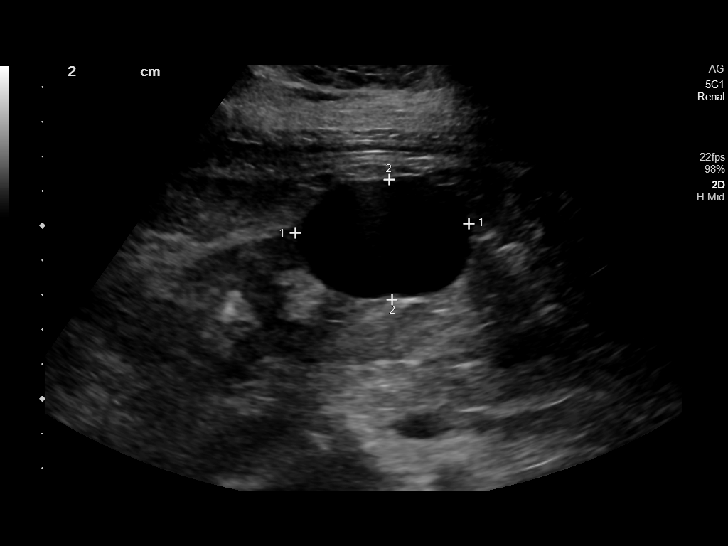
[im 15/33]
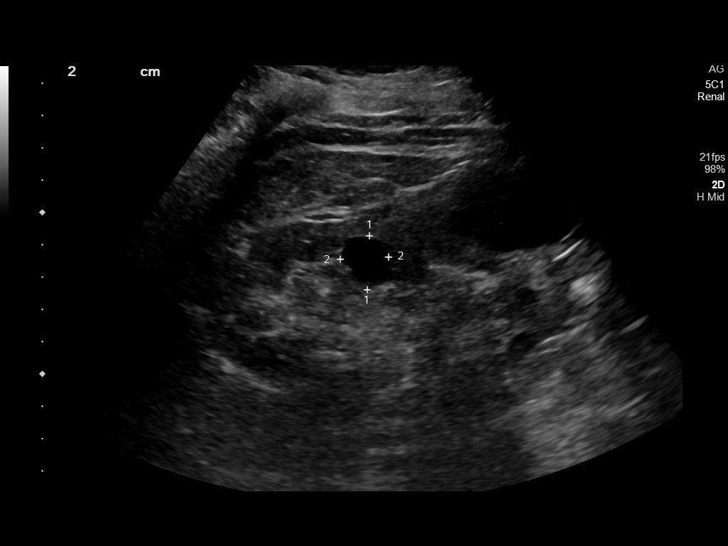
[im 18/33]
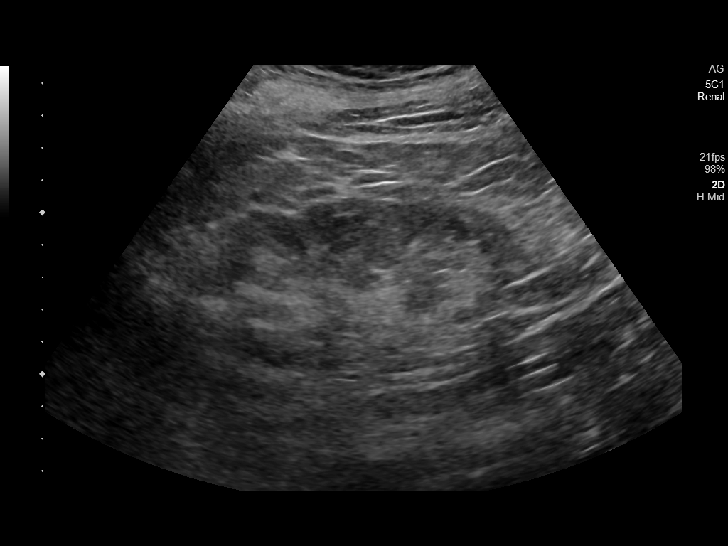
[im 21/33]
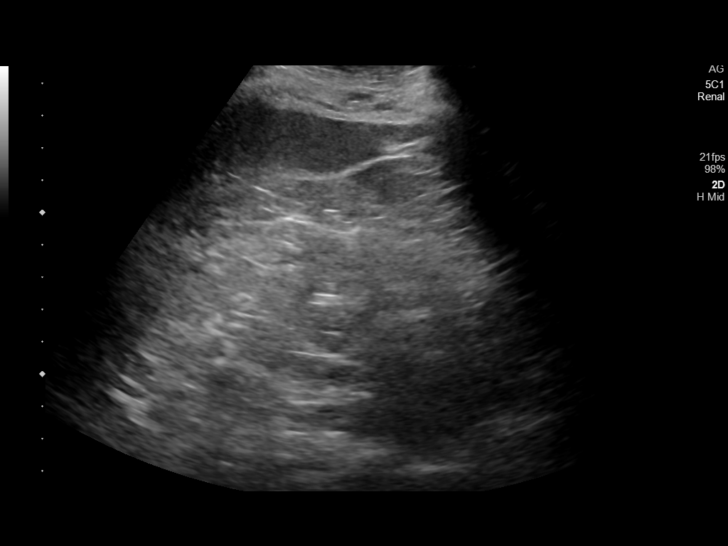
[im 22/33]
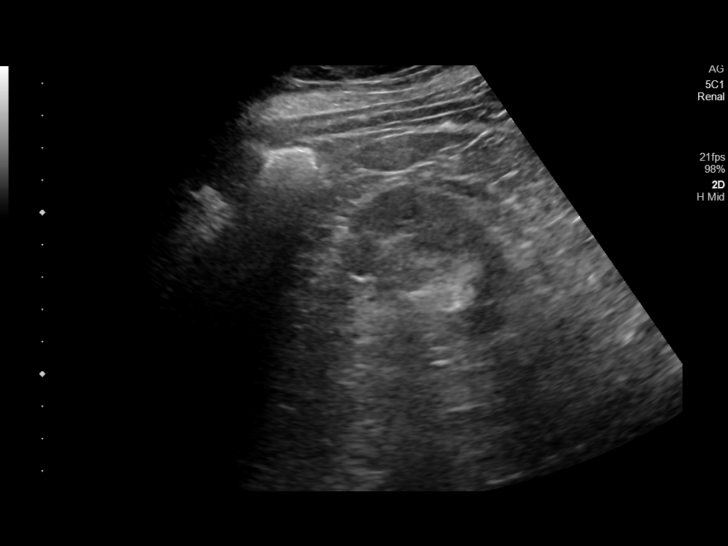
[im 25/33]
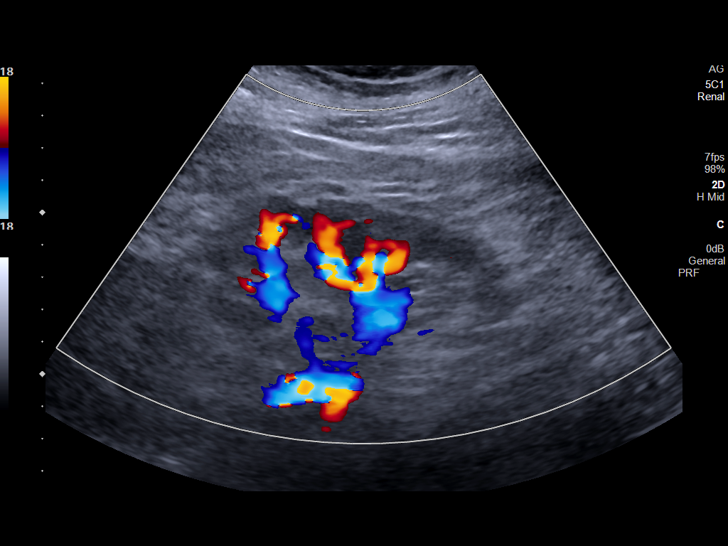
[im 27/33]
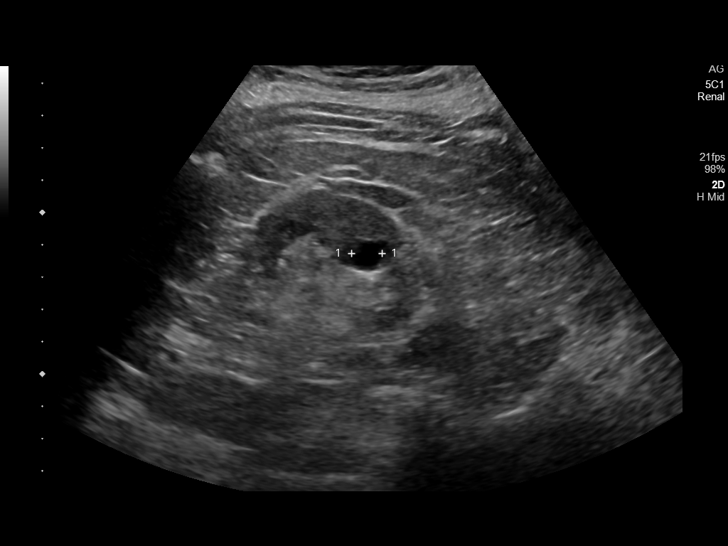
[im 30/33]
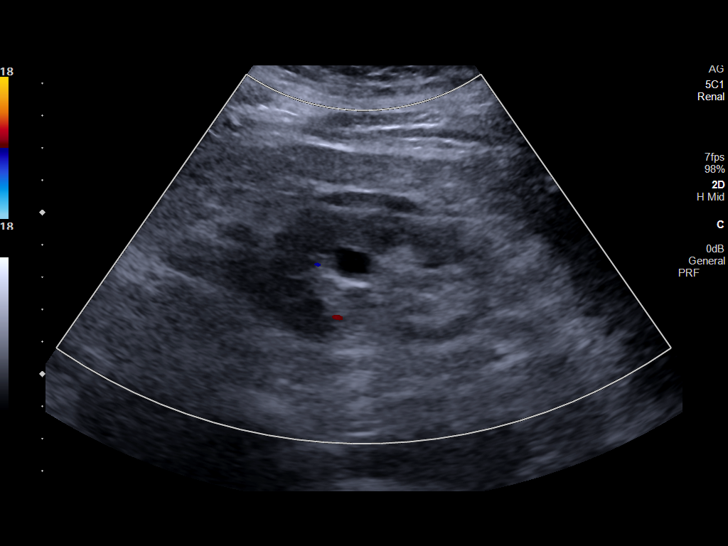
[im 33/33]
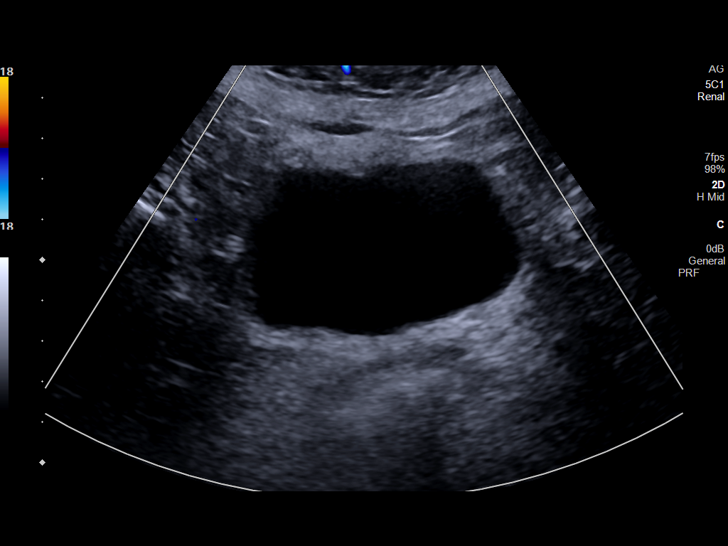

[14 of 25 positions shown; findings below may reference images not displayed]

FINDINGS: Right Kidney:

Renal measurements: 10.6 x 4.7 x 4.5 cm = volume: 116 mL.
Echogenicity within normal limits. No hydronephrosis visualized. In
the inferior pole of the RIGHT kidney there is a simple cyst which
measures 5 x 3.5 by 4.3 cm. Additional simple cyst which measures
1.7 x 1.5 x 1.3 cm in the mid kidney. Known Bosniak 3 lesion is not
visualized sonographically given medial location.

Left Kidney:

Renal measurements: 10.4 x 5.1 x 4.0 cm = volume: 113 mL.
Echogenicity within normal limits. No hydronephrosis visualized.
There is a cyst of the interpolar kidney which measures 11 x 13 x 10
mm.

Bladder:

Appears normal for degree of bladder distention.

Other:

None.
IMPRESSION: 1. Known RIGHT kidney Bosniak 3 lesion is not visualized
sonographically due to deep positioning of this mass. Recommend
continued management as per urology preference. This would be better
monitored with MRI or CT.

## 2021-05-07 NOTE — Progress Notes (Signed)
Chronic Care Management Pharmacy Note  05/19/2021 Name:  Ronette Hank MRN:  [id number] DOB:  1951-01-05  Summary: Patient presents for initial CCM consult. She was previously enrolled in patient assistance for Trulicity, but needs an updated prescription sent in today. Her primary concern are her frequent dizziness spells. They are less frequent and severe recently.   Recommendations/Changes made from today's visit: Updated prescription for Trulicity 3 mg  sent into [facility] for delivery.  Will need to re-apply after Oct 15 for patient assistance.  Continue current medications  Plan: CPP follow-up 6 months  Recommended Problem List Changes:  Modify:  Type 2 diabetes mellitus with stage 3b chronic kidney disease, without long-term current use of insulin ([place])  Hypertension associated with type 2 diabetes mellitus ([place])  Hyperlipidemia associated with type 2 diabetes mellitus ([place])  Subjective: Morine Kohlman is an 70 y.o. year old female who is a primary patient of Fisher, Kirstie Peri, MD.  The CCM team was consulted for assistance with disease management and care coordination needs.    Engaged with patient by telephone for initial visit in response to provider referral for pharmacy case management and/or care coordination services.   Consent to Services:  The patient was given the following information about Chronic Care Management services today, agreed to services, and gave verbal consent: 1. CCM service includes personalized support from designated clinical staff supervised by the primary care provider, including individualized plan of care and coordination with other care providers 2. 24/7 contact phone numbers for assistance for urgent and routine care needs. 3. Service will only be billed when office clinical staff spend 20 minutes or more in a month to coordinate care. 4. Only one practitioner may furnish and bill the service in a calendar month. 5.The patient may stop CCM  services at any time (effective at the end of the month) by phone call to the office staff. 6. The patient will be responsible for cost sharing (co-pay) of up to 20% of the service fee (after annual deductible is met). Patient agreed to services and consent obtained.  Patient Care Team: [name], MD as PCP - General (Family Medicine) Garth Bigness as Referring Physician (Chiropractic Medicine) [place], Gypsy Stoioff, Ronda Fairly, MD (Urology) Germaine Pomfret, [facility] (Pharmacist)  Recent office visits: 02/17/2021 Lelon Huh, MD (PCP Office Visit) for Type II DM- No Medication changes noted; Lab order placed; No follow-up noted  Recent consult visits: None ID  Hospital visits: None in previous 6 months   Objective:  Lab Results  Component Value Date   CREATININE 1.74 (H) 02/18/2021   BUN 25 02/18/2021   GFRNONAA 31 (L) 11/19/2019   GFRAA 36 (L) 11/19/2019   NA 140 02/18/2021   K 5.0 02/18/2021   CALCIUM 9.9 02/18/2021   CO2 19 (L) 02/18/2021   GLUCOSE 141 (H) 02/18/2021    Lab Results  Component Value Date/Time   HGBA1C 7.5 (A) 02/17/2021 10:12 AM   HGBA1C 6.7 (A) 10/20/2020 09:14 AM   HGBA1C 10.5 (H) 09/18/2019 02:52 PM   HGBA1C 7.6 (H) 06/15/2019 11:37 AM   MICROALBUR negative 06/22/2017 11:22 AM   MICROALBUR 20 07/17/2015 11:40 AM    Last diabetic Eye exam:  Lab Results  Component Value Date/Time   HMDIABEYEEXA No Retinopathy 05/21/2020 12:00 AM    Last diabetic Foot exam: No results found for: HMDIABFOOTEX   Lab Results  Component Value Date   CHOL 180 10/20/2020   HDL 45 10/20/2020  LDLCALC 96 10/20/2020   TRIG 227 (H) 10/20/2020   CHOLHDL 4.0 10/20/2020    Hepatic Function Latest Ref Rng & Units 02/18/2021 10/20/2020 11/19/2019  Total Protein 6.0 - 8.5 g/dL - 7.8 7.5  Albumin 3.8 - 4.8 g/dL 4.8 4.5 4.6  AST 0 - 40 IU/L - 22 58(H)  ALT 0 - 32 IU/L - 20 40(H)  Alk Phosphatase 44 - 121 IU/L - 68 70  Total Bilirubin  0.0 - 1.2 mg/dL - 0.3 0.4    Lab Results  Component Value Date/Time   TSH 1.097 08/18/2017 12:51 PM   TSH 1.63 10/30/2014 12:00 AM    CBC Latest Ref Rng & Units 10/20/2020 06/15/2019 08/18/2017  WBC 3.4 - 10.8 x10E3/uL 5.9 6.5 6.8  Hemoglobin 11.1 - 15.9 g/dL 12.4 13.0 14.6  Hematocrit 34.0 - 46.6 % 37.7 39.8 44.3  Platelets 150 - 450 x10E3/uL 230 211 189    Lab Results  Component Value Date/Time   VD25OH 89.5 02/18/2021 09:24 AM   VD25OH 27.8 (L) 10/20/2020 09:40 AM    Clinical ASCVD: No  The 10-year ASCVD risk score (Arnett DK, et al., 2019) is: 21.3%   Values used to calculate the score:     Age: 38 years     Sex: Female     Is Non-Hispanic African American: No     Diabetic: Yes     Tobacco smoker: No     Systolic Blood Pressure: 562 mmHg     Is BP treated: Yes     HDL Cholesterol: 45 mg/dL     Total Cholesterol: 180 mg/dL    Depression screen [facility] 2/9 10/20/2020 09/23/2020 09/18/2019  Decreased Interest 0 0 0  Down, Depressed, Hopeless 0 0 0  PHQ - 2 Score 0 0 0  Altered sleeping 0 - -  Tired, decreased energy 0 - -  Change in appetite 0 - -  Feeling bad or failure about yourself  0 - -  Trouble concentrating 0 - -  Moving slowly or fidgety/restless 0 - -  Suicidal thoughts 0 - -  PHQ-9 Score 0 - -  Difficult doing work/chores Not difficult at all - -    Social History   Tobacco Use  Smoking Status Former   Packs/day: 1.00   Years: 4.00   Pack years: 4.00   Types: Cigarettes  Smokeless Tobacco Never  Tobacco Comments   quit in 1991   BP Readings from Last 3 Encounters:  02/17/21 129/71  10/22/20 124/71  10/20/20 139/65   Pulse Readings from Last 3 Encounters:  02/17/21 89  10/22/20 (!) 105  10/20/20 81   Wt Readings from Last 3 Encounters:  02/17/21 161 lb (73 kg)  10/22/20 160 lb (72.6 kg)  10/20/20 160 lb 6.4 oz (72.8 kg)   BMI Readings from Last 3 Encounters:  02/17/21 25.99 kg/m  10/22/20 25.82 kg/m  10/20/20 25.89 kg/m     Assessment/Interventions: Review of patient past medical history, allergies, medications, health status, including review of consultants reports, laboratory and other test data, was performed as part of comprehensive evaluation and provision of chronic care management services.   SDOH:  (Social Determinants of Health) assessments and interventions performed: Yes SDOH Interventions    Flowsheet Row Most Recent Value  SDOH Interventions   Financial Strain Interventions Other (Comment)  [PAP]      SDOH Screenings   Alcohol Screen: Low Risk    Last Alcohol Screening Score (AUDIT): 0  Depression (PHQ2-9): Low Risk  PHQ-2 Score: 0  Financial Resource Strain: Low Risk    Difficulty of Paying Living Expenses: Not hard at all  Food Insecurity: No Food Insecurity   Worried About Charity fundraiser in the Last Year: Never true   Ran Out of Food in the Last Year: Never true  Housing: [place] Risk Score: 0  Physical Activity: Inactive   Days of Exercise per Week: 0 days   Minutes of Exercise per Session: 0 min  Social Connections: Socially Isolated   Frequency of Communication with Friends and Family: More than three times a week   Frequency of Social Gatherings with Friends and Family: Once a week   Attends Religious Services: Never   Marine scientist or Organizations: No   Attends Archivist Meetings: Never   Marital Status: Widowed  Stress: No Stress Concern Present   Feeling of Stress : Not at all  Tobacco Use: Medium Risk   Smoking Tobacco Use: Former   Smokeless Tobacco Use: Never  Transportation Needs: No Data processing manager (Medical): No   Lack of Transportation (Non-Medical): No    CCM Care Plan  Allergies  Allergen Reactions   Pioglitazone     Dizziness    Medications Reviewed Today     Reviewed by Randal Buba, CMA (Certified Medical Assistant) on 02/17/21 at [place] List Status: <None>    Medication Order Taking? Sig Documenting Provider Last Dose Status Informant  Accu-Chek Softclix Lancets lancets [id number] Yes Use to check blood sugar daily for type 2 diabetes. E11.9 [name], MD Taking Active   amLODipine (NORVASC) 5 MG tablet [id number] Yes Take 1 tablet (5 mg total) by mouth daily. [name], MD Taking Active   aspirin EC 81 MG tablet [id number] Yes Take 81 mg by mouth daily. [provider] Taking Active Self  betamethasone dipropionate (DIPROLENE) 0.05 % cream [id number] Yes Apply topically 2 (two) times daily as needed. [name], MD Taking Active   Blood Glucose Monitoring Suppl (ACCU-CHEK AVIVA PLUS) w/Device KIT [id number] Yes Use to check blood sugar daily for type 2 diabetes. E11.9 [name], MD Taking Active   Cholecalciferol (VITAMIN D-3 PO) [id number] Yes Take 1 tablet by mouth daily. [provider] Taking Active   Clobetasol Prop Emollient Base (CLOBETASOL PROPIONATE E) 0.05 % emollient cream [id number] Yes Apply to aa's lower legs BID until clear. Then PRN thereafter. Avoid f/g/a. Ralene Bathe, MD Taking Active   Dulaglutide (TRULICITY) 1.5 ES/9.2ZR [place] [id number] Yes INJECT 1.5 MG INTO THE SKIN ONCE A WEEK. [name], MD Taking Active   glucose blood (ACCU-CHEK AVIVA PLUS) test strip [id number] Yes Use to check blood sugar daily for type 2 diabetes. E11.9 [name], MD Taking Active   ketoconazole (NIZORAL) 2 % cream [id number] Yes APPLY TO AFFECTED AREA EVERY DAY [name], MD Taking Active            Med Note [facility], [facility] A   Tue Sep 23, 2020 10:21 AM) As needed  lovastatin (MEVACOR) 20 MG tablet [id number] Yes TAKE 1 TABLET BY MOUTH (20 MG TOTAL) AT BEDTIME [name], MD Taking Active   metFORMIN (GLUCOPHAGE) 1000 MG tablet [id number] Yes TAKE 1 TABLET (1,000 MG TOTAL) BY MOUTH 2 (TWO) TIMES DAILY. [name], MD Taking Active   trandolapril (MAVIK) 4 MG tablet [id number] Yes  TAKE 1 TABLET EVERY DAY  [name], MD Taking Active   triamcinolone cream (KENALOG) 0.5 % [id number] Yes Apply 1 application topically 2 (two) times daily. [name], MD Taking Active            Med Note [facility], [facility] A   Tue Sep 23, 2020 10:22 AM) As needed  verapamil (VERELAN PM) 240 MG 24 hr capsule [id number] Yes TAKE 1 CAPSULE (240 MG TOTAL) BY MOUTH AT BEDTIME. 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/30/2015   Eczema 04/30/2015   High cholesterol    Family history of malignant neoplasm of breast    Hypertension    Chronic kidney disease, stage 3b ([place]) 05/18/2011   Seborrhea 07/21/2009   Diabetes mellitus, type 2 ([place]) 01/15/2009    Immunization History  Administered Date(s) Administered   Fluad Quad(high Dose 65+) 06/15/2019, 05/01/2020   Influenza, High Dose Seasonal PF 04/19/2018   Influenza,inj,Quad PF,6+ Mos 07/17/2015, 07/09/2016   Pneumococcal Conjugate-13 11/16/2016   Pneumococcal Polysaccharide-23 09/09/2011, 07/17/2018   Tdap 07/10/2013    Conditions to be addressed/monitored:  Hypertension, Hyperlipidemia, Diabetes, and Chronic Kidney Disease  Care Plan : General Pharmacy (Adult)  Updates made by Germaine Pomfret, RPH since 05/19/2021 12:00 AM     Problem: Hypertension, Hyperlipidemia, Diabetes, and Chronic Kidney Disease   Priority: High     Long-Range Goal: Patient-Specific Goal   Start Date: 05/19/2021  Expected End Date: 05/19/2022  This Visit's Progress: On track  Priority: High  Note:   Current Barriers:  Unable to independently afford treatment regimen  Pharmacist Clinical Goal(s):  Patient will  maintain control of diabetes as evidenced by A1c less than 8%  through collaboration with PharmD and provider.   Interventions: 1:1 collaboration with [name], MD regarding development and update of comprehensive plan of care as evidenced by provider attestation and co-signature Inter-disciplinary care team collaboration (see longitudinal plan of care) Comprehensive medication review performed; medication list updated in electronic medical record  Hypertension (BP goal <140/90) -Controlled -Current treatment: Amlodipine 5 mg daily  Trandolapril 4 mg daily  Verapamil 240 mg nightly  -Medications previously tried: Metoprolol   -Current home readings: Does not routinely monitor at home.  -Reports hypotensive symptoms: Dizziness. Patient reports she has sporadic dizziness that was previously severe. -Unclear reason for combination of amlodipine and verapamil. Patient has been on this regimen for some time and her blood pressure has been well controlled. If dizziness persists, may consider stopping verapamil and starting alternative agent if needed. -Recommended to continue current medication  Hyperlipidemia: (LDL goal < 100) -Controlled -Current treatment: Lovastatin 20 mg daily  -Medications previously tried: NA  -Recommended to continue current medication  Diabetes (A1c goal <8%) -Controlled -Current medications: Metformin 500 mg twice daily  Trulicity 1.5 mg  two injections weekly  -Medications previously tried: NA  -Patient notes low overall appetite since increasing Trulicity to 3 mg weekly. She has lost 10 pounds since her last PCP in addition to changes in her diet and activity level.  -Current home glucose readings:  fasting glucose: 116-148 Post-Prandial: 170s, one instance in 210s -Denies hypoglycemic/hyperglycemic symptoms -Current meal patterns:  breakfast:  Glucerna Shake OR steel cut oats + fruit   supper: Chicken Taco Soup  snacks: Peanut Butter OR tree nuts  OR Sugar-free Jello  drinks: Mainly water. Sparkling Ice waters. No coffee or sweet tea.   -Current exercise: Limited by foot. Uses pedal bike daily throughout the day -Patient may benefit from SGLT2-inhibitor for renal protective benefits, will defer for now.  -Recommended to continue current medication  Chronic Kidney Disease Stage 3b  -All medications assessed for renal dosing and appropriateness in chronic kidney disease. -Recommended to continue current medication  Patient Goals/Self-Care Activities Patient will:  - check glucose daily before breakfast, document, and provide at future appointments check blood pressure weekly, document, and provide at future appointments  Follow Up Plan: Telephone follow up appointment with care management team member scheduled for:  10/20/2021 at 11:00 AM      Medication Assistance:  Trulicity obtained through [employer] medication assistance program.  Enrollment ends Dec 2022  Compliance/Adherence/Medication fill history: Care Gaps: COVID-19 Vaccine Diabetic Foot Exam Zoster Vaccine Influenza Vaccine  Star-Rating Drugs: Metformin 1000 mg last filled on 11/21/2020 for a 90-Day supply no pharmacy noted - Patient had her metformin decreased and is still working through excess supply of medication since that change Lovastatin 20 mg last filled on 02/20/2021 for a 90-Day supply no pharmacy noted Trandolapril 4 mg last filled on 03/04/2021 for a 90-Day supply no pharmacy   Patient's preferred pharmacy is:  [id number] [place] [address]Phone: [phone]: [phone] [place]Phone: [phone]: [phone] [place]Phone: [phone]: [phone] [facility]- [address] [place]Phone: [phone]: [phone] Uses pill box? Yes Pt endorses 100% compliance  We discussed: Current pharmacy is preferred with insurance plan and patient is satisfied with pharmacy services Patient decided to: Continue current medication management strategy  Care Plan and Follow Up Patient Decision:  Patient agrees to Care Plan and Follow-up.  Plan: Telephone follow up appointment with care management team member scheduled for:  10/20/2021 at 11:00 AM  AJunius Argyle PharmD, BPara [phone]

## 2021-05-10 ENCOUNTER — Telehealth: Payer: Self-pay | Admitting: Urology

## 2021-05-10 DIAGNOSIS — N281 Cyst of kidney, acquired: Secondary | ICD-10-CM

## 2021-05-10 NOTE — Telephone Encounter (Signed)
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  Recommend canceling appointment for this week.  I put in an order for a follow-up MRI and can reschedule the appointment after MRI

## 2021-05-11 NOTE — Telephone Encounter (Signed)
Notified patient as instructed, patient pleased. Discussed follow-up appointments, patient agrees  

## 2021-05-14 ENCOUNTER — Telehealth: Payer: Self-pay

## 2021-05-14 NOTE — Telephone Encounter (Signed)
Attempted to call patient to let her know that an increase in medication will need to be approved by Dr Sherrie Mustache and he is out of the office until next week. I was unable to leave a message as the call would not go through. If patient call back, [facility] triage can inform patient.    Copied from CRM [id number]. Topic: General - Other >> May 14, 2021 11:02 AM Jaquita Rector A wrote: Reason for CRM: Patient called in to inform dr Sherrie Mustache that she is waiting to get the dose of her Dulaglutide (TRULICITY) 1.5 MG/0.5ML SOPN to go up to the next dose say that this was discussed with another provider. Also asking for a call back with information. Can fax new order to  [facility] fax# [phone] and pat Ph# [phone]

## 2021-05-15 ENCOUNTER — Ambulatory Visit: Payer: Medicare HMO | Admitting: Urology

## 2021-05-15 ENCOUNTER — Telehealth: Payer: Self-pay

## 2021-05-15 NOTE — Telephone Encounter (Signed)
Attempted to call patient- left message on VM- needs appointment with provider to discuss changes in medication- please call office to schedule.

## 2021-05-15 NOTE — Progress Notes (Signed)
Chronic Care Management Pharmacy Assistant   Name: Anna Sweeney  MRN: [id number] DOB: [birth year]  Initial Visit Questions for appointment 05/19/2021 _0  with Junius Argyle, CPP   Conditions to be addressed/monitored: HTN, DMII, CKD Stage 3b, and SVT, Dyshidrotic eczema, High Cholesterol,   Primary concerns for visit include:  Trulicity increase and having new prescription sent to [facility].   Recent office visits:  02/17/2021 Lelon Huh, MD (PCP Office Visit) for Type II DM- No Medication changes noted; Lab order placed; No follow-up noted  Recent consult visits:  None ID  Hospital visits:  None in previous 6 months  Medications: Outpatient Encounter Medications as of 05/15/2021  Medication Sig Note   Accu-Chek Softclix Lancets lancets Use to check blood sugar daily for type 2 diabetes. E11.9    amLODipine (NORVASC) 5 MG tablet Take 1 tablet (5 mg total) by mouth daily.    aspirin EC 81 MG tablet Take 81 mg by mouth daily.    betamethasone dipropionate (DIPROLENE) 0.05 % cream Apply topically 2 (two) times daily as needed.    Blood Glucose Monitoring Suppl (ACCU-CHEK AVIVA PLUS) w/Device KIT Use to check blood sugar daily for type 2 diabetes. E11.9    Cholecalciferol (VITAMIN D-3 PO) Take 2 tablets by mouth daily.    Clobetasol Prop Emollient Base (CLOBETASOL PROPIONATE E) 0.05 % emollient cream Apply to aa's lower legs BID until clear. Then PRN thereafter. Avoid f/g/a.    Dulaglutide (TRULICITY) 1.5 NL/8.9QJ SOPN INJECT 1.5 MG INTO THE SKIN ONCE A WEEK.    glucose blood (ACCU-CHEK AVIVA PLUS) test strip Use to check blood sugar daily for type 2 diabetes. E11.9    ketoconazole (NIZORAL) 2 % cream APPLY TO AFFECTED AREA EVERY DAY 09/23/2020: As needed   lovastatin (MEVACOR) 20 MG tablet TAKE 1 TABLET BY MOUTH (20 MG TOTAL) AT BEDTIME    metFORMIN (GLUCOPHAGE) 1000 MG tablet Take 0.5 tablets (500 mg total) by mouth 2 (two) times daily.    trandolapril (MAVIK) 4 MG  tablet TAKE 1 TABLET EVERY DAY    triamcinolone cream (KENALOG) 0.5 % Apply 1 application topically 2 (two) times daily. 09/23/2020: As needed   verapamil (VERELAN PM) 240 MG 24 hr capsule TAKE 1 CAPSULE (240 MG TOTAL) BY MOUTH AT BEDTIME.    No facility-administered encounter medications on file as of 05/15/2021.   Care Gaps: COVID-19 Vaccine Diabetic Foot Exam Zoster Vaccine Influenza Vaccine  Star Rating Drugs: Metformin 1000 mg last filled on 11/21/2020 for a 90-Day supply no pharmacy noted Lovastatin 20 mg last filled on 02/20/2021 for a 90-Day supply no pharmacy noted Trandolapril 4 mg last filled on 03/04/2021 for a 90-Day supply no pharmacy noted Trulicity 1.5 mg I think this patient is already on patient assistance for this medication  Have you seen any other providers since your last visit? No   Any changes in your medications or health? Yes patient advised that her Trulicity was increased to 3 mg due to elevated blood sugars and she has been experiencing some dizziness.   Any side effects from any medications? According to the patient not that she is aware of  Do you have an symptoms or problems not managed by your medications? At this time the only symptom that she is having is dizziness and she reports that it comes on out of no where.   Any concerns about your health right now? Yes Per the patient her elevated blood sugars and the dizziness she is experiencing  Has your provider asked that you check blood pressure, blood sugar, or follow special diet at home? no  Do you get any type of exercise on a regular basis? Patient reports that she has right foot pain and it limits her ability to exercise, but her son purchased her a pedal bike that she does to exercise her legs.   Can you think of a goal you would like to reach for your health? Patient stated she would like to stop being dizzy as it makes it hard for her to do normal things. She denies any other type of pain out  side of her Right foot and feels it has nothing to do with her ears as there is no pain there. I did ask the patient when she experiences this dizziness out of  no where does she check to see what her blood sugar level is at that time. She reports that she has never thought to do that, and currently she doesn't check her blood sugars often but she will try to remember to check the level next time she has a dizzy spell.  Do you have any problems getting your medications? She is able to get all her medications with out issues except for her Trulicity that she currently receives patient assistance for through [facility] and since Dr. Caryn Section asked her to increase the Trulicity she will need a new prescription sent to [facility] cares for the new order of 3 mg weekly.     Please bring medications and supplements to appointment   Lynann Bologna, [place] Pharmacist Assistant Phone: [phone]

## 2021-05-15 NOTE — Telephone Encounter (Signed)
Telephone encounter today with Anna Sweeney, [facility] addressing this issue below.

## 2021-05-19 ENCOUNTER — Ambulatory Visit (INDEPENDENT_AMBULATORY_CARE_PROVIDER_SITE_OTHER): Payer: Medicare HMO

## 2021-05-19 DIAGNOSIS — I10 Essential (primary) hypertension: Secondary | ICD-10-CM

## 2021-05-19 DIAGNOSIS — E7800 Pure hypercholesterolemia, unspecified: Secondary | ICD-10-CM

## 2021-05-19 DIAGNOSIS — Z79899 Other long term (current) drug therapy: Secondary | ICD-10-CM | POA: Diagnosis not present

## 2021-05-19 DIAGNOSIS — E1122 Type 2 diabetes mellitus with diabetic chronic kidney disease: Secondary | ICD-10-CM

## 2021-05-19 DIAGNOSIS — N1832 Chronic kidney disease, stage 3b: Secondary | ICD-10-CM

## 2021-05-19 MED ORDER — TRULICITY 3 MG/0.5ML ~~LOC~~ SOAJ: 3.0000 mg | SUBCUTANEOUS | 0 refills | Status: DC

## 2021-05-19 NOTE — Patient Instructions (Signed)
Visit Information It was great speaking with you today!  Please let me know if you have any questions about our visit.   Goals Addressed             This Visit's Progress    Monitor and Manage My Blood Sugar-Diabetes Type 2       Timeframe:  Long-Range Goal Priority:  High Start Date: 05/19/2021                            Expected End Date: 05/19/2022                      Follow Up within 90 days   -check blood sugar daily before breakfast - check blood sugar if I feel it is too high or too low - enter blood sugar readings and medication or insulin into daily log - take the blood sugar log to all doctor visits    Why is this important?   Checking your blood sugar at home helps to keep it from getting very high or very low.  Writing the results in a diary or log helps the doctor know how to care for you.  Your blood sugar log should have the time, date and the results.  Also, write down the amount of insulin or other medicine that you take.  Other information, like what you ate, exercise done and how you were feeling, will also be helpful.     Notes:         Patient Care Plan: General Pharmacy (Adult)     Problem Identified: Hypertension, Hyperlipidemia, Diabetes, and Chronic Kidney Disease   Priority: High     Long-Range Goal: Patient-Specific Goal   Start Date: 05/19/2021  Expected End Date: 05/19/2022  This Visit's Progress: On track  Priority: High  Note:   Current Barriers:  Unable to independently afford treatment regimen  Pharmacist Clinical Goal(s):  Patient will maintain control of diabetes as evidenced by A1c less than 8%  through collaboration with PharmD and provider.   Interventions: 1:1 collaboration with Malva Limes, MD regarding development and update of comprehensive plan of care as evidenced by provider attestation and co-signature Inter-disciplinary care team collaboration (see longitudinal plan of care) Comprehensive medication review  performed; medication list updated in electronic medical record  Hypertension (BP goal <140/90) -Controlled -Current treatment: Amlodipine 5 mg daily  Trandolapril 4 mg daily  Verapamil 240 mg nightly  -Medications previously tried: Metoprolol   -Current home readings: Does not routinely monitor at home.  -Reports hypotensive symptoms: Dizziness. Patient reports she has sporadic dizziness that was previously severe. -Unclear reason for combination of amlodipine and verapamil. Patient has been on this regimen for some time and her blood pressure has been well controlled. If dizziness persists, may consider stopping verapamil and starting alternative agent if needed. -Recommended to continue current medication  Hyperlipidemia: (LDL goal < 100) -Controlled -Current treatment: Lovastatin 20 mg daily  -Medications previously tried: NA  -Recommended to continue current medication  Diabetes (A1c goal <8%) -Controlled -Current medications: Metformin 500 mg twice daily  Trulicity 1.5 mg  two injections weekly  -Medications previously tried: NA  -Patient notes low overall appetite since increasing Trulicity to 3 mg weekly. She has lost 10 pounds since her last PCP in addition to changes in her diet and activity level.  -Current home glucose readings:  fasting glucose: 116-148 Post-Prandial: 170s, one instance in 210s -Denies  hypoglycemic/hyperglycemic symptoms -Current meal patterns:  breakfast: Glucerna Shake OR steel cut oats + fruit   supper: Chicken Taco Soup  snacks: Peanut Butter OR tree nuts OR Sugar-free Jello  drinks: Mainly water. Sparkling Ice waters. No coffee or sweet tea.   -Current exercise: Limited by foot. Uses pedal bike daily throughout the day -Patient may benefit from SGLT2-inhibitor for renal protective benefits, will defer for now.  -Recommended to continue current medication  Chronic Kidney Disease Stage 3b  -All medications assessed for renal dosing and  appropriateness in chronic kidney disease. -Recommended to continue current medication  Patient Goals/Self-Care Activities Patient will:  - check glucose daily before breakfast, document, and provide at future appointments check blood pressure weekly, document, and provide at future appointments  Follow Up Plan: Telephone follow up appointment with care management team member scheduled for:  10/20/2021 at 11:00 AM    Anna Sweeney was given information about Chronic Care Management services today including:  CCM service includes personalized support from designated clinical staff supervised by her physician, including individualized plan of care and coordination with other care providers 24/7 contact phone numbers for assistance for urgent and routine care needs. Standard insurance, coinsurance, copays and deductibles apply for chronic care management only during months in which we provide at least 20 minutes of these services. Most insurances cover these services at 100%, however patients may be responsible for any copay, coinsurance and/or deductible if applicable. This service may help you avoid the need for more expensive face-to-face services. Only one practitioner may furnish and bill the service in a calendar month. The patient may stop CCM services at any time (effective at the end of the month) by phone call to the office staff.  Patient agreed to services and verbal consent obtained.   Patient verbalizes understanding of instructions provided today and agrees to view in MyChart.   Anna Sweeney, PharmD, Anna Sweeney, CPP  Clinical Pharmacist [facility] [phone]

## 2021-05-20 DIAGNOSIS — K760 Fatty (change of) liver, not elsewhere classified: Secondary | ICD-10-CM | POA: Diagnosis not present

## 2021-05-27 ENCOUNTER — Encounter: Payer: Self-pay | Admitting: Family Medicine

## 2021-06-03 ENCOUNTER — Ambulatory Visit: Payer: Self-pay

## 2021-06-03 NOTE — Progress Notes (Signed)
[employer] Refill form for Trulicity 3 mg completed by PCP and faxed for review on 06/03/21.   Angelena Sole, PharmD, Patsy [place], CPP  Clinical Pharmacist [facility] [phone]

## 2021-06-08 DIAGNOSIS — I10 Essential (primary) hypertension: Secondary | ICD-10-CM

## 2021-06-08 DIAGNOSIS — E1122 Type 2 diabetes mellitus with diabetic chronic kidney disease: Secondary | ICD-10-CM | POA: Diagnosis not present

## 2021-06-08 DIAGNOSIS — N1832 Chronic kidney disease, stage 3b: Secondary | ICD-10-CM | POA: Diagnosis not present

## 2021-06-08 DIAGNOSIS — E7800 Pure hypercholesterolemia, unspecified: Secondary | ICD-10-CM

## 2021-06-10 ENCOUNTER — Telehealth: Payer: Self-pay

## 2021-06-10 NOTE — Progress Notes (Addendum)
    Chronic Care Management Pharmacy Assistant   Name: Anna Sweeney  MRN: [id number] DOB: 1951/05/31  Patient Assistance Renewal Application for 2023 (Trulicity)  I received a task requesting I start the renewal process for patient assistance for the patient's Trulicity that she currently receives through the [employer]. [employer] does require the patient's to send in a renewal application for 2023 year prior to 08/08/2021.  I will start the application process today. Once I have completed the application it will be e-mailed to CPP/PTM for mailing to the patient's home address. Once the patient has received the application in the mail the patient will be instructed to complete her portion of the application, return it to the CPP at [facility] so he can fax it to [facility] @ [phone] for processing.   I tried calling the patient to explain the process to her, but I had to leave a detailed message advising that the application will be sent to her home address unless she contacts me back to let me know she would rather pick it up at the clinic. I also advised the patient that once she has completed her portion to return it to [facility] so Trinna Post can fax it for processing. Application will be e-mailed to CPP/PTM by the end of business today for mailing if I do not hear back from the patient.  06/11/2021  Patient returned my call to inform me that it's okay to mail the application to her home, and she has a PCP appointment on 06/23/2021 she will return the application that day. Application has been e-mailed to CPP/PTM for mailing.   Adelene Idler, CPA/CMA Clinical Pharmacist Assistant Phone: [phone]

## 2021-06-18 DIAGNOSIS — Z79899 Other long term (current) drug therapy: Secondary | ICD-10-CM | POA: Diagnosis not present

## 2021-06-23 ENCOUNTER — Ambulatory Visit (INDEPENDENT_AMBULATORY_CARE_PROVIDER_SITE_OTHER): Payer: Medicare HMO | Admitting: Family Medicine

## 2021-06-23 ENCOUNTER — Other Ambulatory Visit: Payer: Self-pay | Admitting: Family Medicine

## 2021-06-23 ENCOUNTER — Other Ambulatory Visit: Payer: Self-pay

## 2021-06-23 ENCOUNTER — Encounter: Payer: Self-pay | Admitting: Family Medicine

## 2021-06-23 VITALS — BP 122/58 | HR 86 | Temp 98.3°F | Wt 152.0 lb

## 2021-06-23 DIAGNOSIS — E1122 Type 2 diabetes mellitus with diabetic chronic kidney disease: Secondary | ICD-10-CM | POA: Diagnosis not present

## 2021-06-23 DIAGNOSIS — Z23 Encounter for immunization: Secondary | ICD-10-CM | POA: Diagnosis not present

## 2021-06-23 DIAGNOSIS — N1832 Chronic kidney disease, stage 3b: Secondary | ICD-10-CM | POA: Diagnosis not present

## 2021-06-23 DIAGNOSIS — E7800 Pure hypercholesterolemia, unspecified: Secondary | ICD-10-CM | POA: Diagnosis not present

## 2021-06-23 DIAGNOSIS — I10 Essential (primary) hypertension: Secondary | ICD-10-CM

## 2021-06-23 LAB — POCT GLYCOSYLATED HEMOGLOBIN (HGB A1C): Hemoglobin A1C: 6.5 % — AB (ref 4.0–5.6)

## 2021-06-23 MED ORDER — BACTROBAN NASAL 2 % NA OINT: 1.0000 "application " | TOPICAL_OINTMENT | Freq: Two times a day (BID) | NASAL | 0 refills | Status: DC

## 2021-06-23 NOTE — Telephone Encounter (Signed)
Requested medication (s) are due for refill today- no  Requested medication (s) are on the active medication list -yes  Future visit scheduled -no  Last refill: 06/23/21  Notes to clinic: Pharmacy request alternative medication- product unavailable   Requested Prescriptions  Pending Prescriptions Disp Refills   mupirocin ointment (BACTROBAN) 2 % [Pharmacy Med Name: MUPIROCIN 2% OINTMENT] 22 g 0    Sig: PLACE 1 APPLICATION INTO THE NOSE 2 (TWO) TIMES DAILY.     Off-Protocol Failed - 06/23/2021 12:17 PM      Failed - Medication not assigned to a protocol, review manually.      Passed - Valid encounter within last 12 months    Recent Outpatient Visits           Today Primary hypertension   [facility] Malva Limes, MD   4 months ago Type 2 diabetes mellitus without complication, without long-term current use of insulin [facility])   [facility] Malva Limes, MD   8 months ago Primary hypertension   [facility] Malva Limes, MD   1 year ago Type 2 diabetes mellitus without complication, without long-term current use of insulin [facility])   [facility] Malva Limes, MD   1 year ago Type 2 diabetes mellitus without complication, without long-term current use of insulin [facility])   [facility] Malva Limes, MD                 Requested Prescriptions  Pending Prescriptions Disp Refills   mupirocin ointment (BACTROBAN) 2 % [Pharmacy Med Name: MUPIROCIN 2% OINTMENT] 22 g 0    Sig: PLACE 1 APPLICATION INTO THE NOSE 2 (TWO) TIMES DAILY.     Off-Protocol Failed - 06/23/2021 12:17 PM      Failed - Medication not assigned to a protocol, review manually.      Passed - Valid encounter within last 12 months    Recent Outpatient Visits           Today Primary hypertension   [facility] Malva Limes, MD   4 months ago Type 2 diabetes mellitus without complication, without long-term  current use of insulin [facility])   [facility] Malva Limes, MD   8 months ago Primary hypertension   [facility] Malva Limes, MD   1 year ago Type 2 diabetes mellitus without complication, without long-term current use of insulin [facility])   [facility] Malva Limes, MD   1 year ago Type 2 diabetes mellitus without complication, without long-term current use of insulin [facility])   [facility] Malva Limes, MD

## 2021-06-23 NOTE — Progress Notes (Signed)
Established patient visit   Patient: Anna Sweeney   DOB: [birth year]   70 y.o. Female  MRN: [id number] Visit Date: 06/23/2021  Today's healthcare provider: Lelon Huh, MD   Chief Complaint  Patient presents with   Hyperlipidemia   Hypertension   Diabetes    Subjective    HPI  Diabetes Mellitus Type II, follow-up  Lab Results  Component Value Date   HGBA1C 6.5 (A) 06/23/2021   HGBA1C 7.5 (A) 02/17/2021   HGBA1C 6.7 (A) 10/20/2020   Last seen for diabetes 4 months ago.  Management since then includes  increasing Trulicity to 54m.  See telephone encounter 04/15/2021 She reports excellent compliance with treatment. She is not having side effects. Had some dizziness with previous medication which has resolved.   Home blood sugar records:  are checked occasionally, pt states her blood sugars are "fine".  Episodes of hypoglycemia? No    Current insulin regiment:  Most Recent Eye Exam: 05/21/2020 (pt states she has an appointment 07/06/2021)  --------------------------------------------------------------------------------------------------- Hypertension, follow-up  BP Readings from Last 3 Encounters:  06/23/21 (!) 122/58  02/17/21 129/71  10/22/20 124/71   Wt Readings from Last 3 Encounters:  06/23/21 152 lb (68.9 kg)  02/17/21 161 lb (73 kg)  10/22/20 160 lb (72.6 kg)     She was last seen for hypertension 4 months ago.  BP at that visit was 129/71. Management since that visit includes no changes. She reports excellent compliance with treatment. She is not having side effects.  She is exercising. She is adherent to low salt diet.   Outside blood pressures are .  She does not smoke.  Use of agents associated with hypertension: none.   --------------------------------------------------------------------------------------------------- Lipid/Cholesterol, follow-up  Last Lipid Panel: Lab Results  Component Value Date   CHOL 180 10/20/2020   LDLCALC  96 10/20/2020   HDL 45 10/20/2020   TRIG 227 (H) 10/20/2020    She was last seen for this 8 months ago.  Management since that visit includes no changes.  She reports excellent compliance with treatment. She is not having side effects.   Symptoms: No appetite changes No foot ulcerations  No chest pain No chest pressure/discomfort  No dyspnea No orthopnea  No fatigue No lower extremity edema  No palpitations No paroxysmal nocturnal dyspnea  No nausea No numbness or tingling of extremity  No polydipsia No polyuria  No speech difficulty No syncope     Last metabolic panel Lab Results  Component Value Date   GLUCOSE 141 (H) 02/18/2021   NA 140 02/18/2021   K 5.0 02/18/2021   BUN 25 02/18/2021   CREATININE 1.74 (H) 02/18/2021   EGFR 31 (L) 02/18/2021   GFRNONAA 31 (L) 11/19/2019   CALCIUM 9.9 02/18/2021   AST 22 10/20/2020   ALT 20 10/20/2020   The 10-year ASCVD risk score (Arnett DK, et al., 2019) is: 19.3%  ---------------------------------------------------------------------------------------------------     Medications: Outpatient Medications Prior to Visit  Medication Sig   Accu-Chek Softclix Lancets lancets Use to check blood sugar daily for type 2 diabetes. E11.9   amLODipine (NORVASC) 5 MG tablet Take 1 tablet (5 mg total) by mouth daily.   aspirin EC 81 MG tablet Take 81 mg by mouth daily.   betamethasone dipropionate (DIPROLENE) 0.05 % cream Apply topically 2 (two) times daily as needed.   Blood Glucose Monitoring Suppl (ACCU-CHEK AVIVA PLUS) w/Device KIT Use to check blood sugar daily for type 2 diabetes. E11.9  Cholecalciferol (VITAMIN D-3) 125 MCG (5000 UT) TABS Take 5,000 Units by mouth daily.   Clobetasol Prop Emollient Base (CLOBETASOL PROPIONATE E) 0.05 % emollient cream Apply to aa's lower legs BID until clear. Then PRN thereafter. Avoid f/g/a.   Dulaglutide (TRULICITY) 3 DU/4.3CV SOPN Inject 3 mg as directed once a week. Patient receives through  [employer] through Dec 2022   glucose blood (ACCU-CHEK AVIVA PLUS) test strip Use to check blood sugar daily for type 2 diabetes. E11.9   lovastatin (MEVACOR) 20 MG tablet TAKE 1 TABLET BY MOUTH (20 MG TOTAL) AT BEDTIME   metFORMIN (GLUCOPHAGE) 1000 MG tablet Take 0.5 tablets (500 mg total) by mouth 2 (two) times daily.   trandolapril (MAVIK) 4 MG tablet TAKE 1 TABLET EVERY DAY   triamcinolone cream (KENALOG) 0.5 % Apply 1 application topically 2 (two) times daily.   verapamil (VERELAN PM) 240 MG 24 hr capsule TAKE 1 CAPSULE (240 MG TOTAL) BY MOUTH AT BEDTIME.   No facility-administered medications prior to visit.    Review of Systems  Constitutional: Negative.   Respiratory: Negative.    Cardiovascular: Negative.   Gastrointestinal: Negative.   Skin:  Negative for wound.  Neurological:  Negative for dizziness, speech difficulty, weakness, light-headedness, numbness and headaches.      Objective    BP (!) 122/58 (BP Location: Right Arm, Patient Position: Sitting, Cuff Size: Large)   Pulse 86   Temp 98.3 F (36.8 C) (Oral)   Wt 152 lb (68.9 kg)   SpO2 96%   BMI 24.53 kg/m    Physical Exam   General: Appearance:    Well developed, well nourished female in no acute distress  Eyes:    PERRL, conjunctiva/corneas clear, EOM's intact       Lungs:     Clear to auscultation bilaterally, respirations unlabored  Heart:    Normal heart rate. Normal rhythm. No murmurs, rubs, or gallops.    MS:   All extremities are intact.    Neurologic:   Awake, alert, oriented x 3. No apparent focal neurological defect.         Results for orders placed or performed in visit on 06/23/21  POCT glycosylated hemoglobin (Hb A1C)  Result Value Ref Range   Hemoglobin A1C 6.5 (A) 4.0 - 5.6 %    Assessment & Plan     1. Primary hypertension Well controlled.  Continue current medications.    2. Type 2 diabetes mellitus with stage 3b chronic kidney disease, without long-term current use  of insulin (HCC) Well controlled.  Continue current medications.    3. High cholesterol She is tolerating lovastatin well with no adverse effects.    4. Need for influenza vaccination  - Flu Vaccine QUAD High Dose(Fluad)  Sore on nose.  She states that she has sore on nose for several weeks and previously prescribed topical medication by ENT.  Will try mupirocin nasal ointment (BACTROBAN NASAL) 2 %; Place 1 application into the nose 2 (two) times daily.  Dispense: 10 g; Refill: 0   Get back in with ENT if not  effective.   Future Appointments  Date Time Provider [place]  07/27/2021 10:00 AM [facility]  09/29/2021 10:20 AM [facility]-NURSE HEALTH ADVISOR [facility]  10/20/2021 11:00 AM [facility] CCM PHARMACIST [facility]  12/02/2021 10:00 AM Fisher, Kirstie Peri, MD [facility]         The entirety of the information documented in the History of Present Illness, Review  of Systems and Physical Exam were personally obtained by me. Portions of this information were initially documented by the CMA and reviewed by me for thoroughness and accuracy.     Lelon Huh, MD  [facility] [phone] (phone) [phone] (fax)  [place]

## 2021-06-26 MED ORDER — MUPIROCIN CALCIUM 2 % EX CREA: 1.0000 "application " | TOPICAL_CREAM | Freq: Two times a day (BID) | CUTANEOUS | 0 refills | Status: AC

## 2021-07-06 DIAGNOSIS — I10 Essential (primary) hypertension: Secondary | ICD-10-CM | POA: Diagnosis not present

## 2021-07-06 DIAGNOSIS — Z0100 Encounter for examination of eyes and vision without abnormal findings: Secondary | ICD-10-CM | POA: Diagnosis not present

## 2021-07-06 DIAGNOSIS — E109 Type 1 diabetes mellitus without complications: Secondary | ICD-10-CM | POA: Diagnosis not present

## 2021-07-06 DIAGNOSIS — E7800 Pure hypercholesterolemia, unspecified: Secondary | ICD-10-CM | POA: Diagnosis not present

## 2021-07-07 ENCOUNTER — Other Ambulatory Visit: Payer: Self-pay | Admitting: Family Medicine

## 2021-07-07 DIAGNOSIS — E7800 Pure hypercholesterolemia, unspecified: Secondary | ICD-10-CM

## 2021-07-17 ENCOUNTER — Telehealth: Payer: Self-pay

## 2021-07-17 NOTE — Anesthesia Pre-Procedure Evaluation (Signed)
Relevant Problems   No relevant active problems       Anesthetic History   No history of anesthetic complications            Review of Systems / Medical History  Patient summary reviewed, nursing notes reviewed and pertinent labs reviewed    Pulmonary  Within defined limits                 Neuro/Psych   Within defined limits           Cardiovascular  Within defined limits                     GI/Hepatic/Renal  Within defined limits              Endo/Other  Within defined limits           Other Findings              Physical Exam    Airway  Mallampati: II  TM Distance: > 6 cm  Neck ROM: normal range of motion   Mouth opening: Normal     Cardiovascular  Regular rate and rhythm,  S1 and S2 normal,  no murmur, click, rub, or gallop             Dental  No notable dental hx       Pulmonary  Breath sounds clear to auscultation               Abdominal  GI exam deferred       Other Findings            Anesthetic Plan    ASA: 2  Anesthesia type: MAC            Anesthetic plan and risks discussed with: Patient

## 2021-07-17 NOTE — H&P (Signed)
H&P by Marlise Eves., MD at 07/17/21 1008                Author: Marlise Eves., MD  Service: Gastroenterology  Author Type: Physician       Filed: 07/17/21 1009  Date of Service: 07/17/21 1008  Status: Signed          Editor: Marlise Eves., MD (Physician)               [place]   [place], [place]             History and Physical           NAME:  Angela Beard    DOB:   08-30-1950    MRN:   [id number]                   History of Present Illness:  Patient is a 70 y.o. who is seen for history of colon polyps.  Last colonoscopy was in 2017 and a polyp was removed.       PMH:     Past Medical History:        Diagnosis  Date         ?  Allergies       ?  Anxiety           ?  Borderline high cholesterol             PSH:   History reviewed. No pertinent surgical history.      Allergies:   No Known Allergies      Home Medications:     Prior to Admission Medications     Prescriptions  Last Dose  Informant  Patient Reported?  Taking?      atorvastatin (LIPITOR) 40 mg tablet  07/16/2021    Yes  Yes      Sig: Take  by mouth daily.      cetirizine (ZyrTEC) 10 mg tablet  07/16/2021    Yes  Yes      Sig: Take  by mouth.      escitalopram oxalate (Lexapro) 10 mg tablet  07/16/2021    Yes  Yes      Sig: Take 10 mg by mouth daily.      timolol (TIMOPTIC) 0.25 % ophthalmic solution  07/16/2021    Yes  Yes      Sig: Administer 1 Drop to both eyes two (2) times a day.      zolpidem (Ambien) 5 mg tablet  07/16/2021    Yes  Yes      Sig: Take  by mouth nightly as needed for Sleep.               Facility-Administered Medications: None           Hospital Medications:     Current Facility-Administered Medications          Medication  Dose  Route  Frequency           ?  0.9% sodium chloride infusion   50 mL/hr  IntraVENous  CONTINUOUS     ?  sodium chloride (NS) flush 5-40 mL   5-40 mL  IntraVENous  Q8H     ?  sodium chloride (NS) flush 5-40 mL   5-40 mL  IntraVENous  PRN      ?  midazolam (VERSED) injection 0.25-5 mg   0.25-5  mg  IntraVENous  Multiple     ?  fentaNYL citrate (PF) injection 25-200 mcg   25-200 mcg  IntraVENous  Multiple     ?  naloxone (NARCAN) injection 0.4 mg   0.4 mg  IntraVENous  Multiple     ?  flumazeniL (ROMAZICON) 0.1 mg/mL injection 0.2 mg   0.2 mg  IntraVENous  Multiple     ?  simethicone (MYLICON) 21FX/5.8IT oral drops 80 mg   1.2 mL  Oral  Multiple     ?  atropine injection 0.5 mg   0.5 mg  IntraVENous  ONCE PRN           ?  EPINEPHrine (ADRENALIN) 0.1 mg/mL syringe 1 mg   1 mg  Endoscopically  ONCE PRN           Social History:     Social History          Tobacco Use         ?  Smoking status:  Never     ?  Smokeless tobacco:  Never       Substance Use Topics         ?  Alcohol use:  Yes             Comment: rarely           Family History:   History reviewed. No pertinent family history.               Review of Systems:         Constitutional: negative fever, negative chills, negative weight loss   Eyes:   negative visual changes   ENT:   negative sore throat, tongue or lip swelling   Respiratory:  negative cough, negative dyspnea   Cards:  negative for chest pain, palpitations, lower extremity edema   GI:   See HPI   GU:  negative for frequency, dysuria   Integument:  negative for rash and pruritus   Heme:  negative for easy bruising and gum/nose bleeding   Musculoskel: negative for myalgias,  back pain and muscle weakness   Neuro: negative for headaches, dizziness, vertigo   Psych:  negative for feelings of anxiety, depression            Objective:     Patient Vitals for the past 8 hrs:              BP  Temp  Pulse  Resp  SpO2  Height  Weight              07/17/21 0934  (!) 139/54  97.7 ??F (36.5 ??C)  72  13  97 %  _0  (1.6 m)  52.6 kg (115 lb 14.4 oz)        No intake/output data recorded.   No intake/output data recorded.      EXAM:      NEURO-a&o    HEENT-wnl    LUNGS-clear     COR-regular rate and rhythym      ABD-soft , no tenderness, no rebound,  good bs      EXT-no edema       Data Review       No results for input(s): WBC, HGB, HCT, PLT, HGBEXT, HCTEXT, PLTEXT in the last 72 hours.   No results for input(s): NA, K, CL, CO2, BUN, CREA, GLU, PHOS, CA in the last 72 hours.   No results for input(s): AP, TBIL, TP, ALB, GLOB, GGT,  AML, LPSE in the last 72 hours.      No lab exists for component: SGOT, GPT, AMYP, HLPSE   No results for input(s): INR, PTP, APTT, INREXT in the last 72 hours.            Assessment:          History of colon polyps     There is no problem list on file for this patient.        Plan:     ??  The patient was counseled at length about the risks of contracting Covid-19 in the peri-operative and post-operative states including the recovery window  of their procedure.  The patient was made aware that contracting Covid-19 after a surgical procedure may worsen their prognosis for recovering from the virus and lend to a higher morbidity and or mortality risk.  The patient was given the options of  postponing their procedure. All of the risks, benefits, and alternatives were discussed. The patient does wish to proceed with the procedure.   ??    Endoscopic procedure with MAC           Signed By:  Birdie Hopes. Elisha Ponder, MD           07/17/2021  10:08 AM

## 2021-07-17 NOTE — Procedures (Signed)
Procedures  by Marlise Eves., MD at 07/17/21 1042                Author: Marlise Eves., MD  Service: Gastroenterology  Author Type: Physician       Filed: 07/17/21 1044  Date of Service: 07/17/21 1042  Status: Signed          Editor: Marlise Eves., MD (Physician)            Pre-procedure Diagnoses        1. History of colon polyps [Z86.010]                           Post-procedure Diagnoses        1. Diverticulosis large intestine w/o perforation or abscess w/o bleeding [K57.30]        2. Internal hemorrhoids with complication [Y69.4]                           Procedures        1. [name], [name]         Colonoscopy Operative Report      Angela Beard   [id number]   [birth year]         Procedure Type:   Colonoscopy --screening       Indications:    Personal history of colon polyps (screening only)             Pre-operative Diagnosis: see indication above      Post-operative Diagnosis:  See findings below      Operator:  Musa Rewerts P. Elisha Ponder, MD      Staff: Endoscopy Imogene Burn: Bernell List   Endoscopy RN-1: Dossie Der, RN       Referring Provider: Ivor Messier, PA-C         Sedation:  MAC anesthesia Propofol         Procedure Details:  After informed consent was obtained with all risks and benefits of procedure explained and preoperative exam completed, the patient was taken to the endoscopy suite and placed in the left lateral decubitus position.  Upon sequential  sedation as per above, a digital rectal exam was performed demonstrating internal hemorrhoids.  The Olympus pediatric videocolonoscope  was inserted in the rectum and carefully advanced to the cecum, which was identified by the ileocecal valve and appendiceal  orifice.  The cecum was identified by the ileocecal valve and appendiceal orifice.  The quality of preparation was good.  The colonoscope was  slowly withdrawn with careful evaluation between folds. Retroflexion in the rectum was completed .       Findings:    Rectum: normal   Sigmoid: moderate to severe diverticulosis   Descending Colon: normal   Transverse Colon: normal   Ascending Colon: normal   Cecum: normal   Terminal Ileum: not intubated         Specimen Removed:  none      Complications: None.       EBL:  None.      Impression:       As above      Recommendations:   High fiber diet education  Repeat colonoscopy in 5 years         Signed By:  Birdie Hopes. Elisha Ponder, MD           07/17/2021  10:43 AM

## 2021-07-17 NOTE — Progress Notes (Signed)
    Chronic Care Management Pharmacy Assistant   Name: Yulisa Chirico  MRN: [id number] DOB: [birth year]  Patient Assistant Renewal Application Status  Patient's Renewal Application for [employer] was faxed to [employer] on 07/06/2021. I received a task from Junius Argyle, CPP to contact [employer] to check the status of the application for the patient to make sure they received the application and nothing more was needed.  I spoke with Wells Guiles, and she confirmed that they did receive the patient's application. She also confirmed that the patient has been enrolled successfully in the program for the 2023 year, and there is nothing more needed from the patient or the providers office. Patient's application is good until 08/08/2022. We can start the renewal for 2024 as early as 05/23/2022.   I contacted the patient to provide her with the updated information of her approval, but I had to leave her a message informing her of the approval of her application. I provided my direct number of [phone] if the patient has any questions I encouraged her to give me a call back.   Medications: Outpatient Encounter Medications as of 07/17/2021  Medication Sig Note   Accu-Chek Softclix Lancets lancets Use to check blood sugar daily for type 2 diabetes. E11.9    amLODipine (NORVASC) 5 MG tablet Take 1 tablet (5 mg total) by mouth daily.    aspirin EC 81 MG tablet Take 81 mg by mouth daily.    betamethasone dipropionate (DIPROLENE) 0.05 % cream Apply topically 2 (two) times daily as needed.    Blood Glucose Monitoring Suppl (ACCU-CHEK AVIVA PLUS) w/Device KIT Use to check blood sugar daily for type 2 diabetes. E11.9    Cholecalciferol (VITAMIN D-3) 125 MCG (5000 UT) TABS Take 5,000 Units by mouth daily.    Clobetasol Prop Emollient Base (CLOBETASOL PROPIONATE E) 0.05 % emollient cream Apply to aa's lower legs BID until clear. Then PRN thereafter. Avoid f/g/a.    Dulaglutide (TRULICITY) 3 YI/0.1KP SOPN  Inject 3 mg as directed once a week. Patient receives through [employer] through Dec 2022    glucose blood (ACCU-CHEK AVIVA PLUS) test strip Use to check blood sugar daily for type 2 diabetes. E11.9    lovastatin (MEVACOR) 20 MG tablet TAKE 1 TABLET BY MOUTH (20 MG TOTAL) AT BEDTIME    metFORMIN (GLUCOPHAGE) 1000 MG tablet Take 0.5 tablets (500 mg total) by mouth 2 (two) times daily.    mupirocin cream (BACTROBAN) 2 % Apply 1 application topically 2 (two) times daily.    trandolapril (MAVIK) 4 MG tablet TAKE 1 TABLET EVERY DAY    triamcinolone cream (KENALOG) 0.5 % Apply 1 application topically 2 (two) times daily. 09/23/2020: As needed   verapamil (VERELAN PM) 240 MG 24 hr capsule TAKE 1 CAPSULE (240 MG TOTAL) BY MOUTH AT BEDTIME.    No facility-administered encounter medications on file as of 07/17/2021.   Lynann Bologna, CPA/CMA Clinical Pharmacist Assistant Phone: [phone]

## 2021-07-17 NOTE — Anesthesia Post-Procedure Evaluation (Signed)
Procedure(s):  COLONOSCOPY.    MAC    Anesthesia Post Evaluation        Patient participation: complete - patient participated  Level of consciousness: awake  Pain management: adequate  Airway patency: patent  Anesthetic complications: no  Cardiovascular status: hemodynamically stable  Respiratory status: acceptable  Hydration status: acceptable  Comments: The patient is ready for PACU discharge.  Tkeya Stencil I Fatiha Guzy, DO                   Post anesthesia nausea and vomiting:  controlled      INITIAL Post-op Vital signs:   Vitals Value Taken Time   BP 106/58 07/17/21 1100   Temp     Pulse 72 07/17/21 1100   Resp 15 07/17/21 1100   SpO2 99 % 07/17/21 1101   Vitals shown include unvalidated device data.

## 2021-07-17 NOTE — Progress Notes (Signed)
Initial RN admission and assessment performed and documented in Endoscopy navigator.     Patient evaluated by anesthesia in pre-procedure holding.     All procedural vital signs, airway assessment, and level of consciousness information monitored and recorded by anesthesia staff on the anesthesia record.     Report received from CRNA post procedure.  Patient transported to recovery area by RN.    Endoscope was pre-cleaned at bedside immediately following procedure by Justin.

## 2021-07-21 DIAGNOSIS — Z79899 Other long term (current) drug therapy: Secondary | ICD-10-CM | POA: Diagnosis not present

## 2021-07-22 ENCOUNTER — Other Ambulatory Visit: Payer: Self-pay | Admitting: Family Medicine

## 2021-07-22 DIAGNOSIS — I10 Essential (primary) hypertension: Secondary | ICD-10-CM

## 2021-07-27 ENCOUNTER — Other Ambulatory Visit: Payer: Medicare HMO

## 2021-08-20 DIAGNOSIS — Z79899 Other long term (current) drug therapy: Secondary | ICD-10-CM | POA: Diagnosis not present

## 2021-09-21 ENCOUNTER — Telehealth: Payer: Self-pay | Admitting: Family Medicine

## 2021-09-21 ENCOUNTER — Other Ambulatory Visit: Payer: Self-pay | Admitting: Family Medicine

## 2021-09-21 DIAGNOSIS — E119 Type 2 diabetes mellitus without complications: Secondary | ICD-10-CM

## 2021-09-21 NOTE — Telephone Encounter (Signed)
[facility] faxed refill request for the following medications: ° °metFORMIN (GLUCOPHAGE) 1000 MG tablet  ° °Please advise. ° °

## 2021-09-21 NOTE — Telephone Encounter (Signed)
Can you please review, I see that current sig for this medication is take 0.5 tablets by mouth 2 times daily. Is this still correct? Previous refills showed that patient was taking 1 tablet by mouth 2times daily. KW

## 2021-09-22 MED ORDER — METFORMIN HCL 500 MG PO TABS: 500.0000 mg | ORAL_TABLET | Freq: Two times a day (BID) | ORAL | 2 refills | Status: DC

## 2021-09-22 NOTE — Addendum Note (Signed)
Addended by: Malva Limes on: 09/22/2021 08:02 AM   Modules accepted: Orders

## 2021-09-29 ENCOUNTER — Ambulatory Visit (INDEPENDENT_AMBULATORY_CARE_PROVIDER_SITE_OTHER): Payer: Medicare PPO

## 2021-09-29 DIAGNOSIS — Z1231 Encounter for screening mammogram for malignant neoplasm of breast: Secondary | ICD-10-CM

## 2021-09-29 DIAGNOSIS — Z0000 Encounter for general adult medical examination without abnormal findings: Secondary | ICD-10-CM | POA: Diagnosis not present

## 2021-09-29 NOTE — Progress Notes (Signed)
Virtual Visit via Telephone Note  I connected with  Anna Sweeney on 09/29/21 at 10:20 AM EST by telephone and verified that I am speaking with the correct person using two identifiers.  Location: Patient: home Provider: [facility] Persons participating in the virtual visit: [place]   I discussed the limitations, risks, security and privacy concerns of performing an evaluation and management service by telephone and the availability of in person appointments. The patient expressed understanding and agreed to proceed.  Interactive audio and video telecommunications were attempted between this nurse and patient, however failed, due to patient having technical difficulties OR patient did not have access to video capability.  We continued and completed visit with audio only.  Some vital signs may be absent or patient reported.   Dionisio David, LPN  Subjective:   Anna Sweeney is a 71 y.o. female who presents for Medicare Annual (Subsequent) preventive examination.  Review of Systems           Objective:    There were no vitals filed for this visit. There is no height or weight on file to calculate BMI.  Advanced Directives 09/23/2020 07/17/2018 08/18/2017  Does Patient Have a Medical Advance Directive? No No No  Would patient like information on creating a medical advance directive? No - Patient declined No - Patient declined -    Current Medications (verified) Outpatient Encounter Medications as of 09/29/2021  Medication Sig   Accu-Chek Softclix Lancets lancets Use to check blood sugar daily for type 2 diabetes. E11.9   amLODipine (NORVASC) 5 MG tablet Take 1 tablet (5 mg total) by mouth daily.   aspirin EC 81 MG tablet Take 81 mg by mouth daily.   betamethasone dipropionate (DIPROLENE) 0.05 % cream Apply topically 2 (two) times daily as needed.   Blood Glucose Monitoring Suppl (TRUE METRIX AIR GLUCOSE METER) w/Device KIT USE AS DIRECTED   Cholecalciferol (VITAMIN  D-3) 125 MCG (5000 UT) TABS Take 5,000 Units by mouth daily.   Clobetasol Prop Emollient Base (CLOBETASOL PROPIONATE E) 0.05 % emollient cream Apply to aa's lower legs BID until clear. Then PRN thereafter. Avoid f/g/a.   Dulaglutide (TRULICITY) 3 IH/4.7QQ SOPN Inject 3 mg as directed once a week. Patient receives through [employer] through Dec 2022   glucose blood (ACCU-CHEK AVIVA PLUS) test strip Use to check blood sugar daily for type 2 diabetes. E11.9   lovastatin (MEVACOR) 20 MG tablet TAKE 1 TABLET BY MOUTH (20 MG TOTAL) AT BEDTIME   metFORMIN (GLUCOPHAGE) 500 MG tablet Take 1 tablet (500 mg total) by mouth 2 (two) times daily.   mupirocin cream (BACTROBAN) 2 % Apply 1 application topically 2 (two) times daily.   trandolapril (MAVIK) 4 MG tablet TAKE 1 TABLET EVERY DAY   triamcinolone cream (KENALOG) 0.5 % Apply 1 application topically 2 (two) times daily.   verapamil (VERELAN PM) 240 MG 24 hr capsule TAKE 1 CAPSULE (240 MG TOTAL) BY MOUTH AT BEDTIME.   No facility-administered encounter medications on file as of 09/29/2021.    Allergies (verified) Pioglitazone   History: Past Medical History:  Diagnosis Date   BRCA negative 11/15/2012   [facility]   Diabetes mellitus without complication (HCC)    History of chicken pox    Hypertension    Past Surgical History:  Procedure Laterality Date   FOOT SURGERY     Family History  Problem Relation Age of Onset   Breast cancer Mother    Breast cancer Sister  Cancer Sister        renal    Social History   Socioeconomic History   Marital status: Widowed    Spouse name: Not on file   Number of children: 2   Years of education: Not on file   Highest education level: Some college, no degree  Occupational History   Occupation: retired  Tobacco Use   Smoking status: Former    Packs/day: 1.00    Years: 4.00    Pack years: 4.00    Types: Cigarettes   Smokeless tobacco: Never   Tobacco comments:    quit in 1991   Vaping Use   Vaping Use: Never used  Substance and Sexual Activity   Alcohol use: No   Drug use: No   Sexual activity: Yes    Birth control/protection: Post-menopausal  Other Topics Concern   Not on file  Social History Narrative   Not on file   Social Determinants of Health   Financial Resource Strain: Low Risk    Difficulty of Paying Living Expenses: Not hard at all  Food Insecurity: No Food Insecurity   Worried About Charity fundraiser in the Last Year: Never true   [place] in the Last Year: Never true  Transportation Needs: No Transportation Needs   Lack of Transportation (Medical): No   Lack of Transportation (Non-Medical): No  Physical Activity: Inactive   Days of Exercise per Week: 0 days   Minutes of Exercise per Session: 0 min  Stress: No Stress Concern Present   Feeling of Stress : Not at all  Social Connections: Socially Isolated   Frequency of Communication with Friends and Family: More than three times a week   Frequency of Social Gatherings with Friends and Family: Once a week   Attends Religious Services: Never   Marine scientist or Organizations: No   Attends Archivist Meetings: Never   Marital Status: Widowed    Tobacco Counseling Counseling given: Not Answered Tobacco comments: quit in 1991   Clinical Intake:  Pre-visit preparation completed: Yes  Pain : No/denies pain     Nutritional Risks: None Diabetes: No  How often do you need to have someone help you when you read instructions, pamphlets, or other written materials from your doctor or pharmacy?: 1 - Never  Diabetic?yes Nutrition Risk Assessment:  Has the patient had any N/V/D within the last 2 months?  No  Does the patient have any non-healing wounds?  No  Has the patient had any unintentional weight loss or weight gain?  No   Diabetes:  Is the patient diabetic?  Yes  If diabetic, was a CBG obtained today?  No  Did the patient bring in their  glucometer from home?  No  How often do you monitor your CBG's? Once in a while.   Financial Strains and Diabetes Management:  Are you having any financial strains with the device, your supplies or your medication? No .  Does the patient want to be seen by Chronic Care Management for management of their diabetes?  No  Would the patient like to be referred to a Nutritionist or for Diabetic Management?  No   Diabetic Exams:  Diabetic Eye Exam: Completed n/d. Overdue for diabetic eye exam. Pt has been advised about the importance in completing this exam.  Diabetic Foot Exam: Completed n/d. Pt has been advised about the importance in completing this exam.    Interpreter Needed?: No  Information entered  by :: Kirke Shaggy, LPN   Activities of Daily Living In your present state of health, do you have any difficulty performing the following activities: 06/23/2021 10/20/2020  Hearing? N N  Vision? N N  Difficulty concentrating or making decisions? N N  Walking or climbing stairs? N N  Dressing or bathing? N N  Doing errands, shopping? N N  Some recent data might be hidden    Patient Care Team: [name], MD as PCP - General (Family Medicine) Garth Bigness as Referring Physician (Chiropractic Medicine) [facility], [facility] Stoioff, Ronda Fairly, MD (Urology) Germaine Pomfret, [facility] (Pharmacist)  Indicate any recent Medical Services you may have received from other than [facility] providers in the past year (date may be approximate).     Assessment:   This is a routine wellness examination for Anna Sweeney.  Hearing/Vision screen No results found.  Dietary issues and exercise activities discussed:     Goals Addressed   None    Depression Screen PHQ 2/9 Scores 06/23/2021 10/20/2020 09/23/2020 09/18/2019 07/17/2018 11/28/2017 11/16/2016  PHQ - 2 Score 0 0 0 0 1 0 0  PHQ- 9 Score 0 0 - - - 0 2    Fall Risk Fall Risk  06/23/2021 10/20/2020 09/23/2020 09/18/2019  07/17/2018  Falls in the past year? 1 1 1  0 0  Number falls in past yr: 0 0 0 0 -  Injury with Fall? 0 0 0 0 -  Risk for fall due to : No Fall Risks - - - -  Follow up Falls evaluation completed - Falls prevention discussed Falls evaluation completed -    FALL RISK PREVENTION PERTAINING TO THE HOME:  Any stairs in or around the home? Yes  If so, are there any without handrails? No  Home free of loose throw rugs in walkways, pet beds, electrical cords, etc? Yes  Adequate lighting in your home to reduce risk of falls? Yes   ASSISTIVE DEVICES UTILIZED TO PREVENT FALLS:  Life alert? No  Use of a cane, walker or w/c? No  Grab bars in the bathroom? No  Shower chair or bench in shower? No  Elevated toilet seat or a handicapped toilet? No    Cognitive Function:Normal cognitive status assessed by direct observation by this Nurse Health Advisor. No abnormalities found.          Immunizations Immunization History  Administered Date(s) Administered   Fluad Quad(high Dose 65+) 06/15/2019, 05/01/2020, 06/23/2021   Influenza, High Dose Seasonal PF 04/19/2018   Influenza,inj,Quad PF,6+ Mos 07/17/2015, 07/09/2016   Pneumococcal Conjugate-13 11/16/2016   Pneumococcal Polysaccharide-23 09/09/2011, 07/17/2018   Tdap 07/10/2013    TDAP status: Up to date  Flu Vaccine status: Up to date  Pneumococcal vaccine status: Up to date  Covid-19 vaccine status: Declined, Education has been provided regarding the importance of this vaccine but patient still declined. Advised may receive this vaccine at local pharmacy or Health Dept.or vaccine clinic. Aware to provide a copy of the vaccination record if obtained from local pharmacy or Health Dept. Verbalized acceptance and understanding.  Qualifies for Shingles Vaccine? Yes   Zostavax completed No   Shingrix Completed?: No.    Education has been provided regarding the importance of this vaccine. Patient has been advised to call insurance company to  determine out of pocket expense if they have not yet received this vaccine. Advised may also receive vaccine at local pharmacy or Health Dept. Verbalized acceptance and understanding.  Screening Tests  Health Maintenance  Topic Date Due   COVID-19 Vaccine (1) Never done   FOOT EXAM  Never done   Zoster Vaccines- Shingrix (1 of 2) Never done   OPHTHALMOLOGY EXAM  05/21/2021   HEMOGLOBIN A1C  12/21/2021   DEXA SCAN  08/11/2022   MAMMOGRAM  11/12/2022   TETANUS/TDAP  07/11/2023   Fecal DNA (Cologuard)  10/29/2023   Pneumonia Vaccine 16+ Years old  Completed   INFLUENZA VACCINE  Completed   Hepatitis C Screening  Completed   HPV VACCINES  Aged Out    Health Maintenance  Health Maintenance Due  Topic Date Due   COVID-19 Vaccine (1) Never done   FOOT EXAM  Never done   Zoster Vaccines- Shingrix (1 of 2) Never done   OPHTHALMOLOGY EXAM  05/21/2021    Colorectal cancer screening: Type of screening: Cologuard. Completed 10/28/20. Repeat every 3 years  Mammogram status: Completed 10/15/20. Repeat every year  Bone Density status: Completed 07/09/19. Results reflect: Bone density results: NORMAL. Repeat every 5 years.  Lung Cancer Screening: (Low Dose CT Chest recommended if Age 60-80 years, 30 pack-year currently smoking OR have quit w/in 15years.) does not qualify.    Additional Screening:  Hepatitis C Screening: does qualify; Completed 07/18/18  Vision Screening: Recommended annual ophthalmology exams for early detection of glaucoma and other disorders of the eye. Is the patient up to date with their annual eye exam?  Yes  Who is the provider or what is the name of the office in which the patient attends annual eye exams? [facility] If pt is not established with a provider, would they like to be referred to a provider to establish care? No .   Dental Screening: Recommended annual dental exams for proper oral hygiene  Community Resource Referral / Chronic Care Management: CRR  required this visit?  No   CCM required this visit?  No      Plan:     I have personally reviewed and noted the following in the patients chart:   Medical and social history Use of alcohol, tobacco or illicit drugs  Current medications and supplements including opioid prescriptions.  Functional ability and status Nutritional status Physical activity Advanced directives List of other physicians Hospitalizations, surgeries, and ER visits in previous 12 months Vitals Screenings to include cognitive, depression, and falls Referrals and appointments  In addition, I have reviewed and discussed with patient certain preventive protocols, quality metrics, and best practice recommendations. A written personalized care plan for preventive services as well as general preventive health recommendations were provided to patient.     Dionisio David, LPN   [id number]   Nurse Notes: none

## 2021-09-29 NOTE — Patient Instructions (Addendum)
Anna Sweeney , Thank you for taking time to come for your Medicare Wellness Visit. I appreciate your ongoing commitment to your health goals. Please review the following plan we discussed and let me know if I can assist you in the future.   Screening recommendations/referrals: Colonoscopy: Cologuard 10/28/20, due 2025 Mammogram: 11/11/20, referral sent Bone Density: 08/11/17, due 2024 Recommended yearly ophthalmology/optometry visit for glaucoma screening and checkup Recommended yearly dental visit for hygiene and checkup  Vaccinations: Influenza vaccine: 06/23/21 Pneumococcal vaccine: 07/17/18 Tdap vaccine: 07/10/13 Shingles vaccine: n/d   Covid-19:n/d  Advanced directives: no  Conditions/risks identified: no  Next appointment: Follow up in one year for your annual wellness visit 09/30/22 @ 11am by phone   Preventive Care 65 Years and Older, Female Preventive care refers to lifestyle choices and visits with your health care provider that can promote health and wellness. What does preventive care include? A yearly physical exam. This is also called an annual well check. Dental exams once or twice a year. Routine eye exams. Ask your health care provider how often you should have your eyes checked. Personal lifestyle choices, including: Daily care of your teeth and gums. Regular physical activity. Eating a healthy diet. Avoiding tobacco and drug use. Limiting alcohol use. Practicing safe sex. Taking low-dose aspirin every day. Taking vitamin and mineral supplements as recommended by your health care provider. What happens during an annual well check? The services and screenings done by your health care provider during your annual well check will depend on your age, overall health, lifestyle risk factors, and family history of disease. Counseling  Your health care provider may ask you questions about your: Alcohol use. Tobacco use. Drug use. Emotional well-being. Home and  relationship well-being. Sexual activity. Eating habits. History of falls. Memory and ability to understand (cognition). Work and work Statistician. Reproductive health. Screening  You may have the following tests or measurements: Height, weight, and BMI. Blood pressure. Lipid and cholesterol levels. These may be checked every 5 years, or more frequently if you are over 55 years old. Skin check. Lung cancer screening. You may have this screening every year starting at age 70 if you have a 30-pack-year history of smoking and currently smoke or have quit within the past 15 years. Fecal occult blood test (FOBT) of the stool. You may have this test every year starting at age 70. Flexible sigmoidoscopy or colonoscopy. You may have a sigmoidoscopy every 5 years or a colonoscopy every 10 years starting at age 70. Hepatitis C blood test. Hepatitis B blood test. Sexually transmitted disease (STD) testing. Diabetes screening. This is done by checking your blood sugar (glucose) after you have not eaten for a while (fasting). You may have this done every 1-3 years. Bone density scan. This is done to screen for osteoporosis. You may have this done starting at age 70. Mammogram. This may be done every 1-2 years. Talk to your health care provider about how often you should have regular mammograms. Talk with your health care provider about your test results, treatment options, and if necessary, the need for more tests. Vaccines  Your health care provider may recommend certain vaccines, such as: Influenza vaccine. This is recommended every year. Tetanus, diphtheria, and acellular pertussis (Tdap, Td) vaccine. You may need a Td booster every 10 years. Zoster vaccine. You may need this after age 70. Pneumococcal 13-valent conjugate (PCV13) vaccine. One dose is recommended after age 70. Pneumococcal polysaccharide (PPSV23) vaccine. One dose is recommended after age 70. Talk  to your health care provider  about which screenings and vaccines you need and how often you need them. This information is not intended to replace advice given to you by your health care provider. Make sure you discuss any questions you have with your health care provider. Document Released: 08/22/2015 Document Revised: 04/14/2016 Document Reviewed: 05/27/2015 Elsevier Interactive Patient Education  2017 [place] Prevention in the Home Falls can cause injuries. They can happen to people of all ages. There are many things you can do to make your home safe and to help prevent falls. What can I do on the outside of my home? Regularly fix the edges of walkways and driveways and fix any cracks. Remove anything that might make you trip as you walk through a door, such as a raised step or threshold. Trim any bushes or trees on the path to your home. Use bright outdoor lighting. Clear any walking paths of anything that might make someone trip, such as rocks or tools. Regularly check to see if handrails are loose or broken. Make sure that both sides of any steps have handrails. Any raised decks and porches should have guardrails on the edges. Have any leaves, snow, or ice cleared regularly. Use sand or salt on walking paths during winter. Clean up any spills in your garage right away. This includes oil or grease spills. What can I do in the bathroom? Use night lights. Install grab bars by the toilet and in the tub and shower. Do not use towel bars as grab bars. Use non-skid mats or decals in the tub or shower. If you need to sit down in the shower, use a plastic, non-slip stool. Keep the floor dry. Clean up any water that spills on the floor as soon as it happens. Remove soap buildup in the tub or shower regularly. Attach bath mats securely with double-sided non-slip rug tape. Do not have throw rugs and other things on the floor that can make you trip. What can I do in the bedroom? Use night lights. Make sure  that you have a light by your bed that is easy to reach. Do not use any sheets or blankets that are too big for your bed. They should not hang down onto the floor. Have a firm chair that has side arms. You can use this for support while you get dressed. Do not have throw rugs and other things on the floor that can make you trip. What can I do in the kitchen? Clean up any spills right away. Avoid walking on wet floors. Keep items that you use a lot in easy-to-reach places. If you need to reach something above you, use a strong step stool that has a grab bar. Keep electrical cords out of the way. Do not use floor polish or wax that makes floors slippery. If you must use wax, use non-skid floor wax. Do not have throw rugs and other things on the floor that can make you trip. What can I do with my stairs? Do not leave any items on the stairs. Make sure that there are handrails on both sides of the stairs and use them. Fix handrails that are broken or loose. Make sure that handrails are as long as the stairways. Check any carpeting to make sure that it is firmly attached to the stairs. Fix any carpet that is loose or worn. Avoid having throw rugs at the top or bottom of the stairs. If you do have throw rugs,  attach them to the floor with carpet tape. Make sure that you have a light switch at the top of the stairs and the bottom of the stairs. If you do not have them, ask someone to add them for you. What else can I do to help prevent falls? Wear shoes that: Do not have high heels. Have rubber bottoms. Are comfortable and fit you well. Are closed at the toe. Do not wear sandals. If you use a stepladder: Make sure that it is fully opened. Do not climb a closed stepladder. Make sure that both sides of the stepladder are locked into place. Ask someone to hold it for you, if possible. Clearly mark and make sure that you can see: Any grab bars or handrails. First and last steps. Where the edge of  each step is. Use tools that help you move around (mobility aids) if they are needed. These include: Canes. Walkers. Scooters. Crutches. Turn on the lights when you go into a dark area. Replace any light bulbs as soon as they burn out. Set up your furniture so you have a clear path. Avoid moving your furniture around. If any of your floors are uneven, fix them. If there are any pets around you, be aware of where they are. Review your medicines with your doctor. Some medicines can make you feel dizzy. This can increase your chance of falling. Ask your doctor what other things that you can do to help prevent falls. This information is not intended to replace advice given to you by your health care provider. Make sure you discuss any questions you have with your health care provider. Document Released: 05/22/2009 Document Revised: 01/01/2016 Document Reviewed: 08/30/2014 Elsevier Interactive Patient Education  2017 [employer].

## 2021-10-15 ENCOUNTER — Telehealth: Payer: Self-pay

## 2021-10-15 NOTE — Progress Notes (Signed)
? ? ?Chronic Care Management ?Pharmacy Assistant  ? ?Name: Anna Sweeney  MRN: [id number] DOB: Mar 16, 1951 ? ?Reason for Encounter:Diabetes Disease State Call. ? ?Recent office visits:  ?09/29/2021 Lucienne Minks LPN (PCP Office) No Medication Changes noted ?06/23/2021 Dr. Caryn Section MD (PCP) Start mupirocin nasal ointment [facility] NASAL) 2 % ? ?Recent consult visits:  ?No recent consult visit ? ?Hospital visits:  ?None in previous 6 months ? ?Medications: ?Outpatient Encounter Medications as of 10/15/2021  ?Medication Sig Note  ? Accu-Chek Softclix Lancets lancets Use to check blood sugar daily for type 2 diabetes. E11.9   ? amLODipine (NORVASC) 5 MG tablet Take 1 tablet (5 mg total) by mouth daily.   ? aspirin EC 81 MG tablet Take 81 mg by mouth daily.   ? betamethasone dipropionate (DIPROLENE) 0.05 % cream Apply topically 2 (two) times daily as needed.   ? Blood Glucose Monitoring Suppl (TRUE METRIX AIR GLUCOSE METER) w/Device KIT USE AS DIRECTED   ? Cholecalciferol (VITAMIN D-3) 125 MCG (5000 UT) TABS Take 5,000 Units by mouth daily.   ? Clobetasol Prop Emollient Base (CLOBETASOL PROPIONATE E) 0.05 % emollient cream Apply to aa's lower legs BID until clear. Then PRN thereafter. Avoid f/g/a.   ? Dulaglutide (TRULICITY) 3 CB/7.6EG SOPN Inject 3 mg as directed once a week. Patient receives through [employer] through Dec 2022   ? glucose blood (ACCU-CHEK AVIVA PLUS) test strip Use to check blood sugar daily for type 2 diabetes. E11.9   ? lovastatin (MEVACOR) 20 MG tablet TAKE 1 TABLET BY MOUTH (20 MG TOTAL) AT BEDTIME   ? metFORMIN (GLUCOPHAGE) 500 MG tablet Take 1 tablet (500 mg total) by mouth 2 (two) times daily.   ? mupirocin cream (BACTROBAN) 2 % Apply 1 application topically 2 (two) times daily.   ? trandolapril (MAVIK) 4 MG tablet TAKE 1 TABLET EVERY DAY   ? triamcinolone cream (KENALOG) 0.5 % Apply 1 application topically 2 (two) times daily. 09/23/2020: As needed  ? verapamil (VERELAN PM) 240 MG 24 hr  capsule TAKE 1 CAPSULE (240 MG TOTAL) BY MOUTH AT BEDTIME.   ? ?No facility-administered encounter medications on file as of 10/15/2021.  ? ? ?Care Gaps: ?COVID-19 Vaccine ?Foot Exam ?Shingrix Vaccine ?Ophthalmology Exam  ? ?Star Rating Drugs: ?Metformin 1000 mg last filled on 11/21/2020 for a 90-Day supply no pharmacy noted ?Lovastatin 20 mg last filled on 09/22/2021 for a 90-Day supply no pharmacy noted ?Trandolapril 4 mg last filled on 07/29/2021 for a 90-Day supply no pharmacy noted ?Trulicity 1.5 mg patient assistance through [employer] ? ?Recent Relevant Labs: ?Lab Results  ?Component Value Date/Time  ? HGBA1C 6.5 (A) 06/23/2021 10:41 AM  ? HGBA1C 7.5 (A) 02/17/2021 10:12 AM  ? HGBA1C 10.5 (H) 09/18/2019 02:52 PM  ? HGBA1C 7.6 (H) 06/15/2019 11:37 AM  ? MICROALBUR negative 06/22/2017 11:22 AM  ? MICROALBUR 20 07/17/2015 11:40 AM  ?  ?Kidney Function ?Lab Results  ?Component Value Date/Time  ? CREATININE 1.74 (H) 02/18/2021 09:24 AM  ? CREATININE 1.83 (H) 10/20/2020 09:40 AM  ? GFRNONAA 31 (L) 11/19/2019 09:36 AM  ? GFRAA 36 (L) 11/19/2019 09:36 AM  ? ? ?Current antihyperglycemic regimen:  ?Metformin 500 mg twice daily  ?Trulicity 1.5 mg  two injections weekly  ? ?What recent interventions/DTPs have been made to improve glycemic control:  ?None ID ? ?Have there been any recent hospitalizations or ED visits since last visit with CPP? No ? ?Patient denies hypoglycemic symptoms, including Pale, Sweaty, Shaky,  Hungry, Nervous/irritable, and Vision changes ? ?Patient denies hyperglycemic symptoms, including blurry vision, excessive thirst, fatigue, polyuria, and weakness ? ?How often are you checking your blood sugar?  ?Patient states she checks her blood sugar when she feels symptomatic.  ? ?What are your blood sugars ranging?  ?Patient states she is unsure what her blood sugar ranges as she does not remember.Patient states she will begin to keep a blood sugar log. ? ?During the week, how often does your blood  glucose drop below 70?  None ID ? ?Are you checking your feet daily/regularly?  ? Patient denies any numbness,pain or tingling sensations in her feet. ? ?Adherence Review: ?Is the patient currently on a STATIN medication? Yes ?Is the patient currently on ACE/ARB medication? Yes ?Does the patient have >5 day gap between last estimated fill dates? Yes ? ?Per Clinical pharmacist, reschedule patient appointment in March to May. ? ?Telephone follow up appointment with Care management team member scheduled for : 12/08/2021 at 11:00 am. ? ?Anderson Malta ?Clinical Pharmacist Assistant ?[phone]  ? ? ?

## 2021-10-20 ENCOUNTER — Telehealth: Payer: Medicare HMO

## 2021-10-22 ENCOUNTER — Ambulatory Visit: Payer: Self-pay | Admitting: Urology

## 2021-11-04 ENCOUNTER — Other Ambulatory Visit: Payer: Self-pay | Admitting: Family Medicine

## 2021-11-25 ENCOUNTER — Ambulatory Visit
Admission: RE | Admit: 2021-11-25 | Discharge: 2021-11-25 | Disposition: A | Discharge status: Home / Self Care | Payer: Medicare PPO | Source: Ambulatory Visit | Attending: Family Medicine | Admitting: Family Medicine

## 2021-11-25 DIAGNOSIS — Z1231 Encounter for screening mammogram for malignant neoplasm of breast: Secondary | ICD-10-CM | POA: Diagnosis present

## 2021-11-25 IMAGING — MG MM DIGITAL SCREENING BILAT W/ TOMO AND CAD
6 of 12 series · 6 of 36 positions shown · non-contrast
Comparison: Previous exam(s).

CLINICAL DATA: Screening.

EXAM:
DIGITAL SCREENING BILATERAL MAMMOGRAM WITH TOMOSYNTHESIS AND CAD
TECHNIQUE: Bilateral screening digital craniocaudal and mediolateral oblique
mammograms were obtained. Bilateral screening digital breast
tomosynthesis was performed. The images were evaluated with
computer-aided detection.

[R CC synth-2D (1 of 2)]
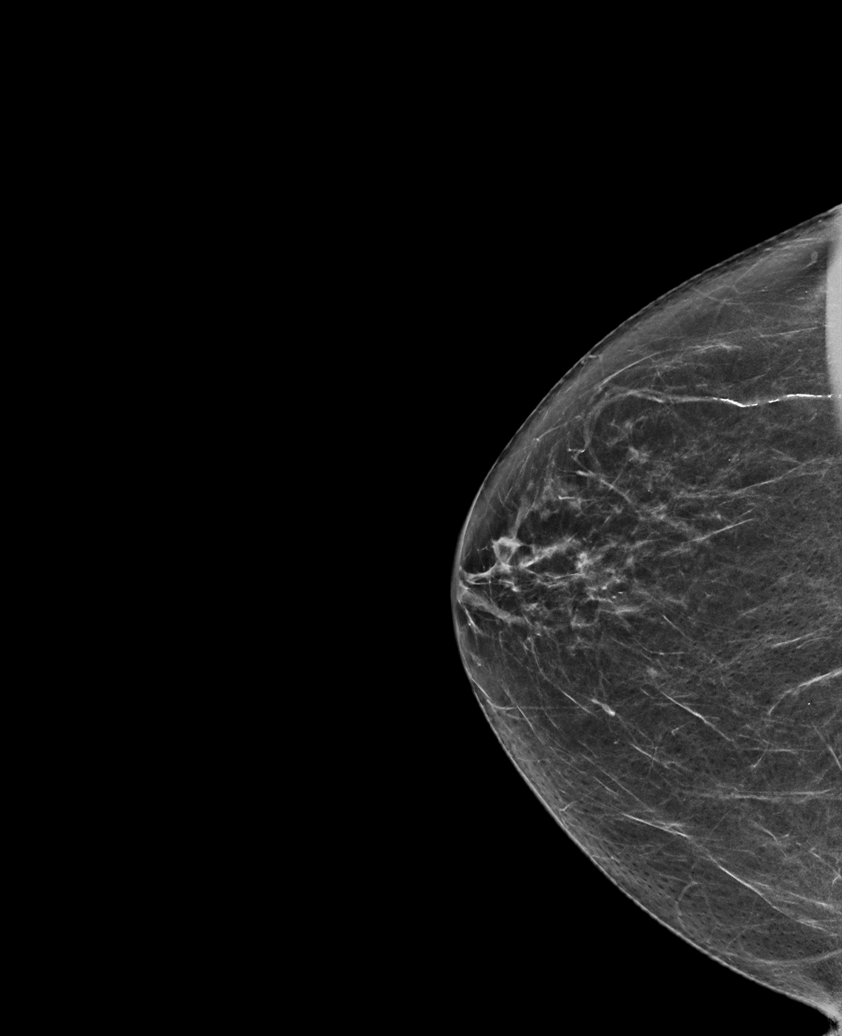

[L MLO synth-2D]
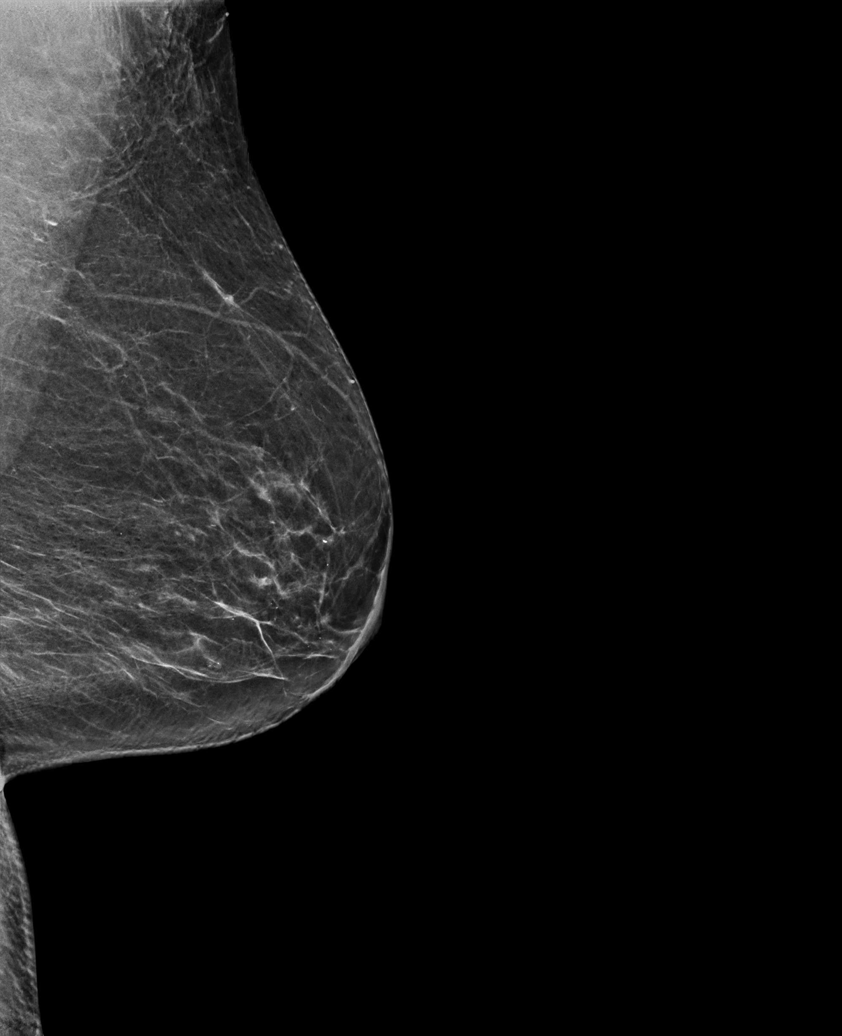

[R MLO synth-2D]
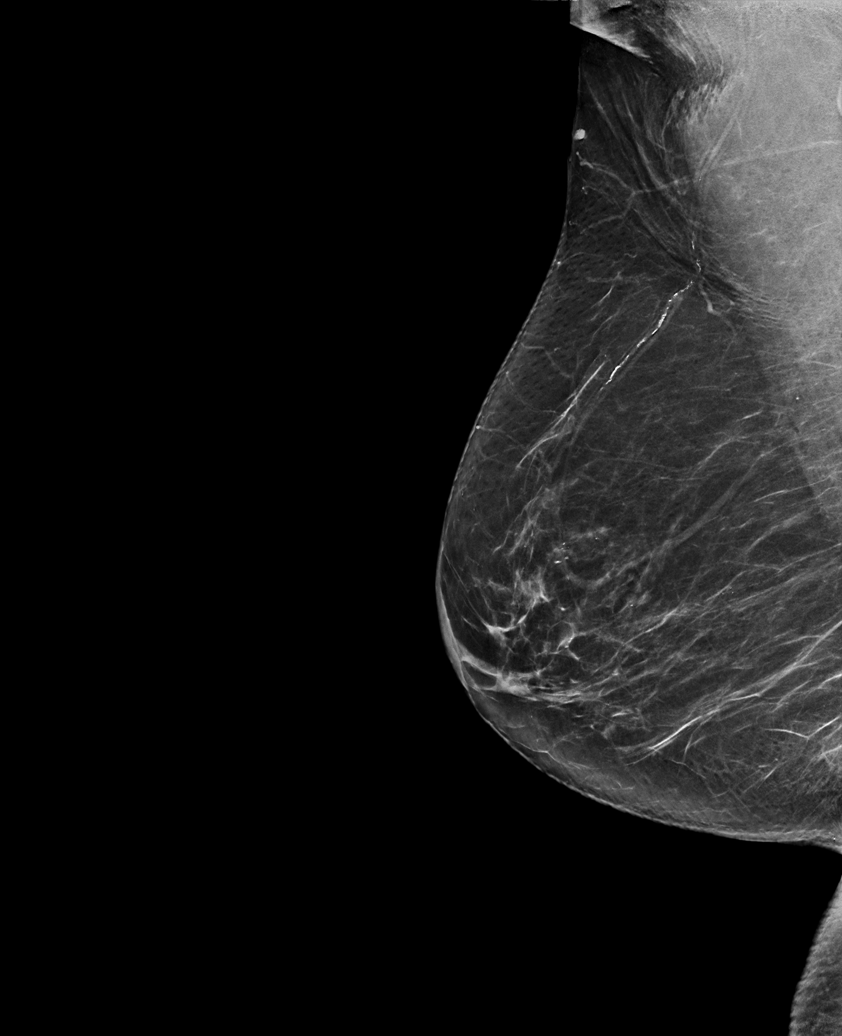

[L CC synth-2D (1 of 2)]
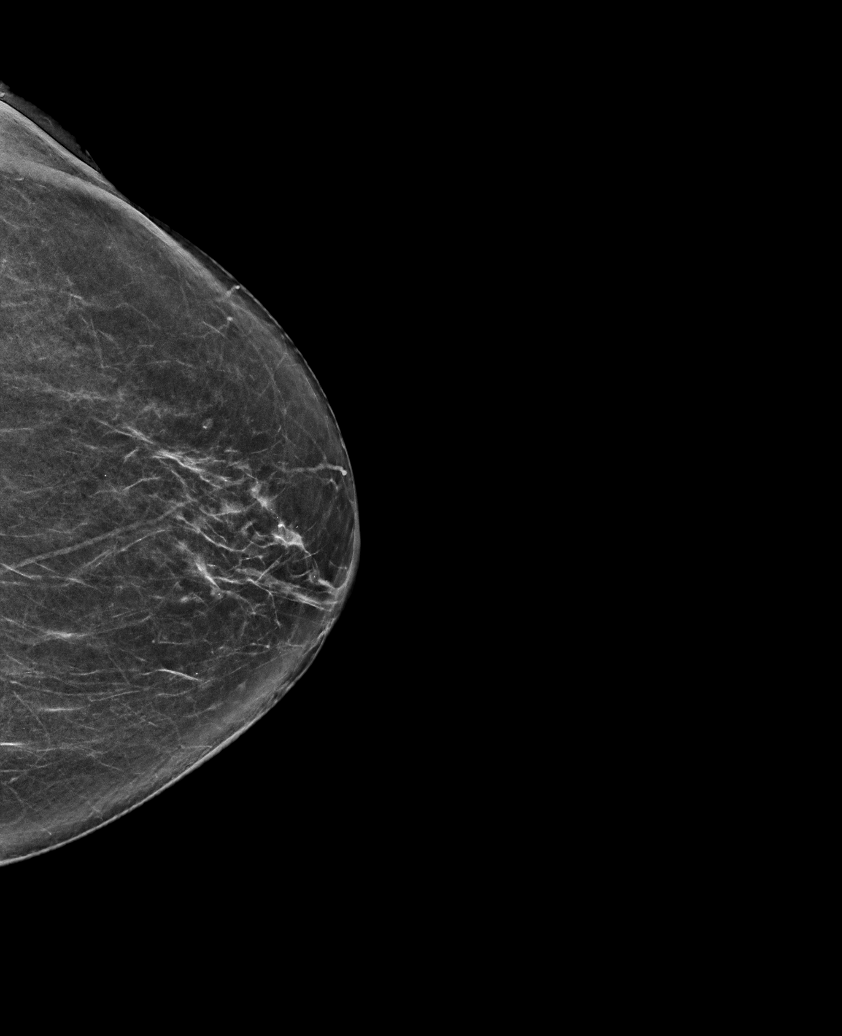

[L CC synth-2D (2 of 2)]
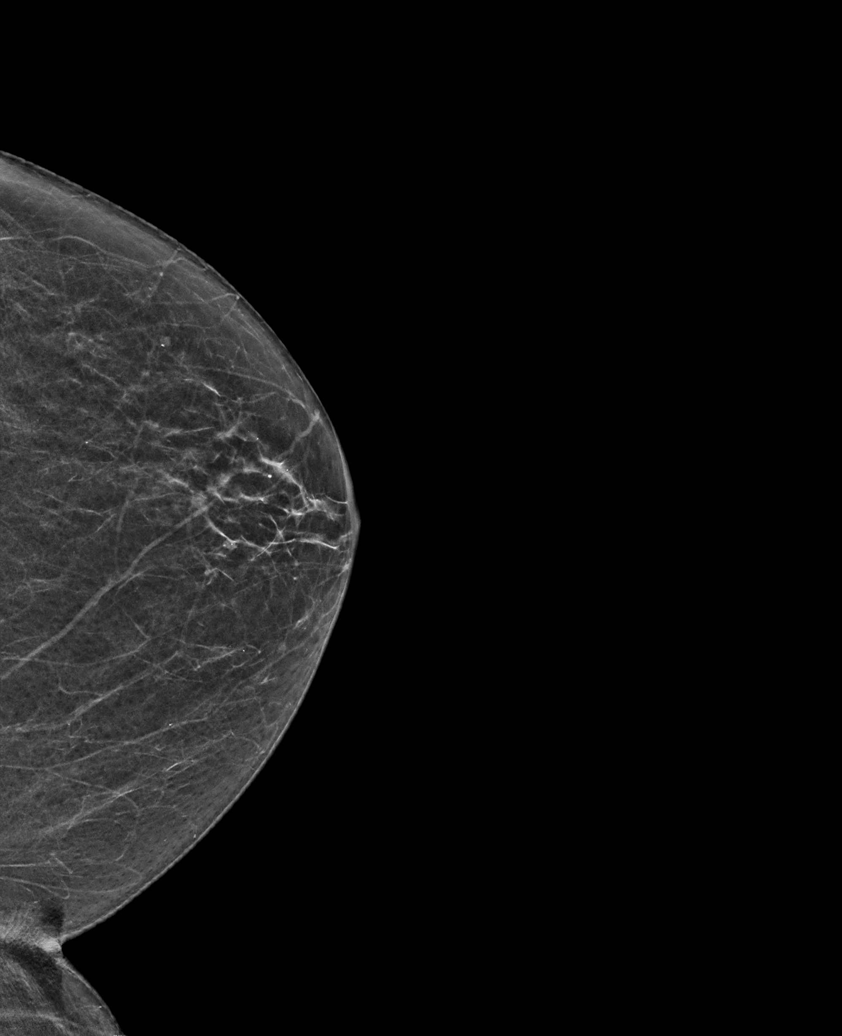

[R CC synth-2D (2 of 2)]
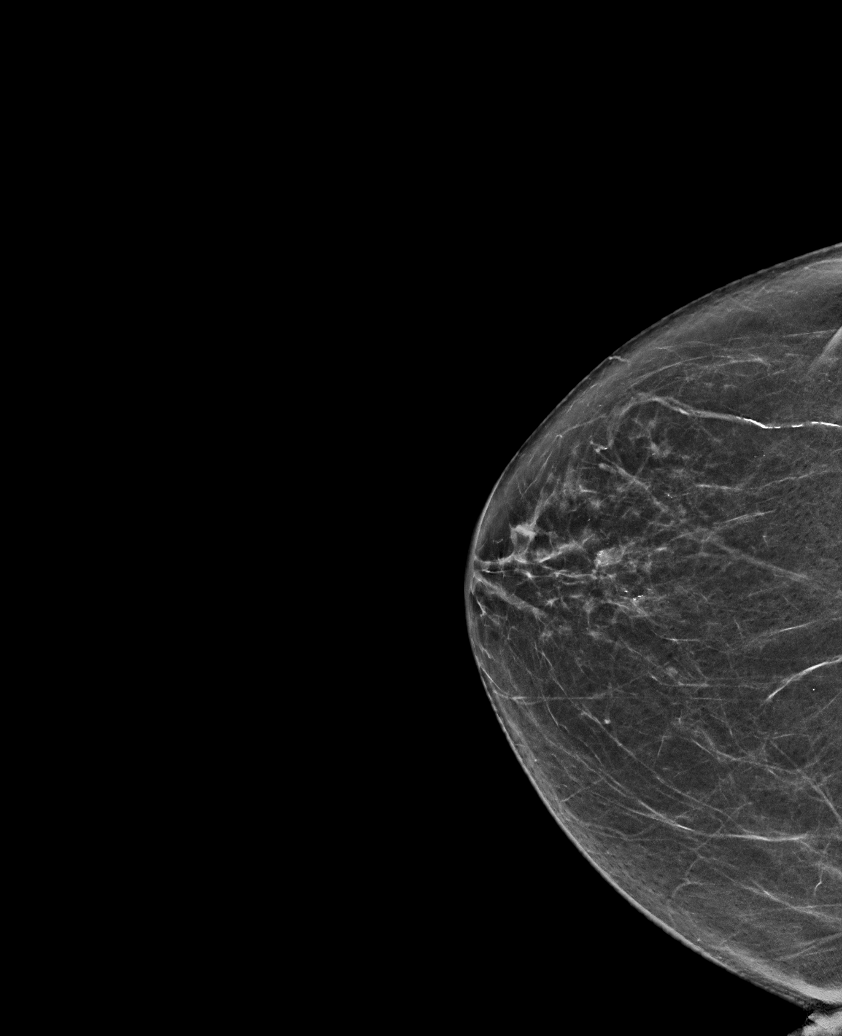

[6 of 36 positions shown; findings below may reference images not displayed]

ACR Breast Density Category b: There are scattered areas of
fibroglandular density.
FINDINGS: There are no findings suspicious for malignancy.
IMPRESSION: No mammographic evidence of malignancy. A result letter of this
screening mammogram will be mailed directly to the patient.

RECOMMENDATION:
Screening mammogram in one year. (Code:[BY])

BI-RADS CATEGORY  1: Negative.

## 2021-12-02 ENCOUNTER — Encounter: Payer: Self-pay | Admitting: Family Medicine

## 2021-12-02 ENCOUNTER — Ambulatory Visit (INDEPENDENT_AMBULATORY_CARE_PROVIDER_SITE_OTHER): Payer: Medicare PPO | Admitting: Family Medicine

## 2021-12-02 VITALS — BP 136/57 | HR 89 | Temp 97.9°F | Resp 16 | Ht 66.0 in | Wt 158.0 lb

## 2021-12-02 DIAGNOSIS — I471 Supraventricular tachycardia: Secondary | ICD-10-CM | POA: Diagnosis not present

## 2021-12-02 DIAGNOSIS — L219 Seborrheic dermatitis, unspecified: Secondary | ICD-10-CM

## 2021-12-02 DIAGNOSIS — K30 Functional dyspepsia: Secondary | ICD-10-CM

## 2021-12-02 DIAGNOSIS — Z0000 Encounter for general adult medical examination without abnormal findings: Secondary | ICD-10-CM | POA: Diagnosis not present

## 2021-12-02 DIAGNOSIS — I10 Essential (primary) hypertension: Secondary | ICD-10-CM

## 2021-12-02 DIAGNOSIS — E559 Vitamin D deficiency, unspecified: Secondary | ICD-10-CM

## 2021-12-02 DIAGNOSIS — E1121 Type 2 diabetes mellitus with diabetic nephropathy: Secondary | ICD-10-CM

## 2021-12-02 DIAGNOSIS — N1832 Chronic kidney disease, stage 3b: Secondary | ICD-10-CM

## 2021-12-02 MED ORDER — NEOMYCIN-POLYMYXIN-HC 3.5-10000-1 OT SOLN: 3.0000 [drp] | Freq: Two times a day (BID) | OTIC | 3 refills | Status: AC | PRN

## 2021-12-02 NOTE — Patient Instructions (Addendum)
Please review the attached list of medications and notify my office if there are any errors.  ? ?Try OTC famotidine (Pepcid)  up to once a day as needed for indigestion. If that doesn't take care of then we should go back down to lower dose of Trulicity.  ? ?You will be due for a routine eye exam by October of this year ?

## 2021-12-02 NOTE — Progress Notes (Signed)
? ? ? ?I,Roshena L Chambers,acting as a scribe for Lelon Huh, MD.,have documented all relevant documentation on the behalf of Lelon Huh, MD,as directed by  Lelon Huh, MD while in the presence of Lelon Huh, MD.  ? ?Complete physical exam ? ? ?Patient: Anna Sweeney   DOB: 09/23/1950   71 y.o. Female  MRN: [id number] ?Visit Date: 12/02/2021 ? ?Today's healthcare provider: Lelon Huh, MD  ? ?Chief Complaint  ?Patient presents with  ? Annual Exam  ? Hypertension  ? Diabetes  ? Hyperlipidemia  ? ?Subjective  ?  ?Anna Sweeney is a 71 y.o. female who presents today for a complete physical exam.  ?She reports consuming a general diet. Home exercise routine includes chair exercise. She generally feels fairly well. She reports sleeping fairly well. She does have additional problems to discuss today. ? ?She states that she has been having very mild heartburn after eating for the last few months. Typically occurs a few times per week, but has not been taking anything for it. No nausea or other abdominal pain.  ? ?Had AWV with [facility] on 09/29/2021.  ? ?HPI  ?Hypertension, follow-up ? ?BP Readings from Last 3 Encounters:  ?12/02/21 (!) 136/57  ?06/23/21 (!) 122/58  ?02/17/21 129/71  ? Wt Readings from Last 3 Encounters:  ?12/02/21 158 lb (71.7 kg)  ?06/23/21 152 lb (68.9 kg)  ?02/17/21 161 lb (73 kg)  ?  ? ?She was last seen for hypertension 5 months ago.  ?BP at that visit was 122/58. Management since that visit includes continuing same medication. ? ?She reports good compliance with treatment. ?She is not having side effects.  ?She is following a Regular diet. ?She is exercising. ?She does not smoke. ? ?Use of agents associated with hypertension: NSAIDS.  ? ?Outside blood pressures are checked and average 120/60. ?Symptoms: ?No chest pain No chest pressure  ?No palpitations No syncope  ?No dyspnea No orthopnea  ?No paroxysmal nocturnal dyspnea No lower extremity edema   ? ? ?---------------------------------------------------------------------------------------------------  ? ?Diabetes Mellitus Type II, Follow-up ? ?Lab Results  ?Component Value Date  ? HGBA1C 6.5 (A) 06/23/2021  ? HGBA1C 7.5 (A) 02/17/2021  ? HGBA1C 6.7 (A) 10/20/2020  ? ?Wt Readings from Last 3 Encounters:  ?12/02/21 158 lb (71.7 kg)  ?06/23/21 152 lb (68.9 kg)  ?02/17/21 161 lb (73 kg)  ? ?Last seen for diabetes 5 months ago.  ?Management since then includes continuing same medication. ?She reports good compliance with treatment. ?She is not having side effects.  ?Symptoms: ?No fatigue No foot ulcerations  ?No appetite changes No nausea  ?No paresthesia of the feet  No polydipsia  ?No polyuria No visual disturbances   ?No vomiting   ? ? ?Home blood sugar records:  blood sugars are only checked when patient does not feel well ? ?Episodes of hypoglycemia? No  ?  ?Current insulin regiment: none ?Most Recent Eye Exam: >1 year ago ?Current exercise: chair exercise ?Current diet habits: on average, 2 fast food meals per week ? ?Pertinent Labs: ?Lab Results  ?Component Value Date  ? NA 140 02/18/2021  ? K 5.0 02/18/2021  ? CREATININE 1.74 (H) 02/18/2021  ? EGFR 31 (L) 02/18/2021  ? MICROALBUR negative 06/22/2017  ?  ? ?---------------------------------------------------------------------------------------------------  ? ?Lipid/Cholesterol, Follow-up ? ?Last lipid panel Other pertinent labs  ?Lab Results  ?Component Value Date  ? CHOL 180 10/20/2020  ? HDL 45 10/20/2020  ? [place] 96 10/20/2020  ? TRIG 227 (H) 10/20/2020  ? CHOLHDL  4.0 10/20/2020  ? Lab Results  ?Component Value Date  ? ALT 20 10/20/2020  ? AST 22 10/20/2020  ? PLT 230 10/20/2020  ? TSH 1.097 08/18/2017  ?  ? ?She was last seen for this 5 months ago.  ?Management since that visit includes continue same medication. ? ?She reports good compliance with treatment. ?She is not having side effects.  ? ?Symptoms: ?No chest pain No chest pressure/discomfort   ?No dyspnea No lower extremity edema  ?No numbness or tingling of extremity No orthopnea  ?No palpitations No paroxysmal nocturnal dyspnea  ?No speech difficulty No syncope  ? ?Current diet: in general, an "unhealthy" diet ?Current exercise:  chair exercise ? ?The 10-year ASCVD risk score (Arnett DK, et al., 2019) is: 25.7% ? ?---------------------------------------------------------------------------------------------------  ? ?Past Medical History:  ?Diagnosis Date  ? BRCA negative 11/15/2012  ? [facility]  ? Diabetes mellitus without complication ([place])   ? History of chicken pox   ? Hypertension   ? ?Past Surgical History:  ?Procedure Laterality Date  ? FOOT SURGERY    ? ?Social History  ? ?Socioeconomic History  ? Marital status: Widowed  ?  Spouse name: Not on file  ? Number of children: 2  ? Years of education: Not on file  ? Highest education level: Some college, no degree  ?Occupational History  ? Occupation: retired  ?Tobacco Use  ? Smoking status: Former  ?  Packs/day: 1.00  ?  Years: 4.00  ?  Pack years: 4.00  ?  Types: Cigarettes  ? Smokeless tobacco: Never  ? Tobacco comments:  ?  quit in 1991  ?Vaping Use  ? Vaping Use: Never used  ?Substance and Sexual Activity  ? Alcohol use: No  ? Drug use: No  ? Sexual activity: Yes  ?  Birth control/protection: Post-menopausal  ?Other Topics Concern  ? Not on file  ?Social History Narrative  ? Not on file  ? ?Social Determinants of Health  ? ?Financial Resource Strain: Low Risk   ? Difficulty of Paying Living Expenses: Not hard at all  ?Food Insecurity: No Food Insecurity  ? Worried About Charity fundraiser in the Last Year: Never true  ? Ran Out of Food in the Last Year: Never true  ?Transportation Needs: No Transportation Needs  ? Lack of Transportation (Medical): No  ? Lack of Transportation (Non-Medical): No  ?Physical Activity: Insufficiently Active  ? Days of Exercise per Week: 5 days  ? Minutes of Exercise per Session: 10 min  ?Stress: No Stress Concern  Present  ? Feeling of Stress : Not at all  ?Social Connections: Socially Isolated  ? Frequency of Communication with Friends and Family: More than three times a week  ? Frequency of Social Gatherings with Friends and Family: More than three times a week  ? Attends Religious Services: Never  ? Active Member of Clubs or Organizations: No  ? Attends Archivist Meetings: Never  ? Marital Status: Widowed  ?Intimate Partner Violence: Not At Risk  ? Fear of Current or Ex-Partner: No  ? Emotionally Abused: No  ? Physically Abused: No  ? Sexually Abused: No  ? ?Family Status  ?Relation Name Status  ? Mother  Deceased at age 46  ?     cancer  ? Sister 1 Deceased  ? Father  Deceased at age 59  ?     Skin cancer and heart disease  ? Sister  (Not Specified)  ? ?Family History  ?Problem  Relation Age of Onset  ? Breast cancer Mother   ? Breast cancer Sister   ? Cancer Sister   ?     renal   ? ?Allergies  ?Allergen Reactions  ? Pioglitazone   ?  Dizziness  ?  ?Patient Care Team: ?[name], MD as PCP - General (Family Medicine) ?Jillyn Hidden D as Referring Physician (Chiropractic Medicine) ?[facility], [place] ?Stoioff, Ronda Fairly, MD (Urology) ?Germaine Pomfret, [facility] (Pharmacist)  ? ?Medications: ?Outpatient Medications Prior to Visit  ?Medication Sig  ? Accu-Chek Softclix Lancets lancets Use to check blood sugar daily for type 2 diabetes. E11.9  ? amLODipine (NORVASC) 5 MG tablet Take 1 tablet (5 mg total) by mouth daily.  ? aspirin EC 81 MG tablet Take 81 mg by mouth daily.  ? betamethasone dipropionate (DIPROLENE) 0.05 % cream Apply topically 2 (two) times daily as needed.  ? Blood Glucose Monitoring Suppl (TRUE METRIX AIR GLUCOSE METER) w/Device KIT USE AS DIRECTED  ? Cholecalciferol (VITAMIN D-3) 125 MCG (5000 UT) TABS Take 5,000 Units by mouth daily.  ? Clobetasol Prop Emollient Base (CLOBETASOL PROPIONATE E) 0.05 % emollient cream Apply to aa's lower legs BID until clear. Then PRN  thereafter. Avoid f/g/a.  ? Dulaglutide (TRULICITY) 3 FT/9.5ZX SOPN Inject 3 mg as directed once a week. Patient receives through [employer] through Dec 2022  ? glucose blood (ACCU-CHEK AVIVA PLUS) test strip Use to check blood sugar daily for ty

## 2021-12-03 ENCOUNTER — Other Ambulatory Visit: Payer: Self-pay | Admitting: Family Medicine

## 2021-12-03 DIAGNOSIS — N179 Acute kidney failure, unspecified: Secondary | ICD-10-CM

## 2021-12-03 DIAGNOSIS — E1121 Type 2 diabetes mellitus with diabetic nephropathy: Secondary | ICD-10-CM

## 2021-12-03 LAB — COMPREHENSIVE METABOLIC PANEL
ALT: 16 IU/L (ref 0–32)
AST: 24 IU/L (ref 0–40)
Albumin/Globulin Ratio: 1.6 (ref 1.2–2.2)
Albumin: 4.8 g/dL (ref 3.8–4.8)
Alkaline Phosphatase: 79 IU/L (ref 44–121)
BUN/Creatinine Ratio: 12 (ref 12–28)
BUN: 33 mg/dL — ABNORMAL HIGH (ref 8–27)
Bilirubin Total: 0.2 mg/dL (ref 0.0–1.2)
CO2: 19 mmol/L — ABNORMAL LOW (ref 20–29)
Calcium: 9.9 mg/dL (ref 8.7–10.3)
Chloride: 100 mmol/L (ref 96–106)
Creatinine, Ser: 2.69 mg/dL — ABNORMAL HIGH (ref 0.57–1.00)
Globulin, Total: 3 g/dL (ref 1.5–4.5)
Glucose: 175 mg/dL — ABNORMAL HIGH (ref 70–99)
Potassium: 4.6 mmol/L (ref 3.5–5.2)
Sodium: 140 mmol/L (ref 134–144)
Total Protein: 7.8 g/dL (ref 6.0–8.5)
eGFR: 18 mL/min/{1.73_m2} — ABNORMAL LOW (ref >59)

## 2021-12-03 LAB — VITAMIN D 25 HYDROXY (VIT D DEFICIENCY, FRACTURES): Vit D, 25-Hydroxy: 72.9 ng/mL (ref 30.0–100.0)

## 2021-12-03 LAB — CBC
Hematocrit: 36.9 % (ref 34.0–46.6)
Hemoglobin: 12.3 g/dL (ref 11.1–15.9)
MCH: 29.4 pg (ref 26.6–33.0)
MCHC: 33.3 g/dL (ref 31.5–35.7)
MCV: 88 fL (ref 79–97)
Platelets: 226 10*3/uL (ref 150–450)
RBC: 4.19 x10E6/uL (ref 3.77–5.28)
RDW: 12.7 % (ref 11.7–15.4)
WBC: 7.6 10*3/uL (ref 3.4–10.8)

## 2021-12-03 LAB — LIPID PANEL
Chol/HDL Ratio: 4 ratio (ref 0.0–4.4)
Cholesterol, Total: 173 mg/dL (ref 100–199)
HDL: 43 mg/dL (ref >39)
LDL Chol Calc (NIH): 86 mg/dL (ref 0–99)
Triglycerides: 263 mg/dL — ABNORMAL HIGH (ref 0–149)
VLDL Cholesterol Cal: 44 mg/dL — ABNORMAL HIGH (ref 5–40)

## 2021-12-03 LAB — HEMOGLOBIN A1C
Est. average glucose Bld gHb Est-mCnc: 163 mg/dL
Hgb A1c MFr Bld: 7.3 % — ABNORMAL HIGH (ref 4.8–5.6)

## 2021-12-03 LAB — MICROALBUMIN / CREATININE URINE RATIO
Creatinine, Urine: 137.1 mg/dL
Microalb/Creat Ratio: 14 mg/g creat (ref 0–29)
Microalbumin, Urine: 19.5 ug/mL

## 2021-12-04 ENCOUNTER — Other Ambulatory Visit: Payer: Self-pay

## 2021-12-04 MED ORDER — GLIPIZIDE ER 2.5 MG PO TB24: 2.5000 mg | ORAL_TABLET | Freq: Every day | ORAL | 0 refills | Status: DC

## 2021-12-08 ENCOUNTER — Telehealth: Payer: Medicare HMO

## 2021-12-17 ENCOUNTER — Other Ambulatory Visit: Payer: Self-pay | Admitting: Nephrology

## 2021-12-25 ENCOUNTER — Encounter: Payer: Self-pay | Admitting: Physician Assistant

## 2021-12-25 ENCOUNTER — Ambulatory Visit: Payer: Self-pay | Admitting: *Deleted

## 2021-12-25 ENCOUNTER — Ambulatory Visit (INDEPENDENT_AMBULATORY_CARE_PROVIDER_SITE_OTHER): Payer: Medicare PPO | Admitting: Physician Assistant

## 2021-12-25 VITALS — BP 122/60 | HR 84 | Temp 97.9°F | Resp 16 | Wt 154.0 lb

## 2021-12-25 DIAGNOSIS — R42 Dizziness and giddiness: Secondary | ICD-10-CM

## 2021-12-25 MED ORDER — MECLIZINE HCL 12.5 MG PO TABS: 12.5000 mg | ORAL_TABLET | Freq: Three times a day (TID) | ORAL | 0 refills | Status: DC | PRN

## 2021-12-25 MED ORDER — MECLIZINE HCL 12.5 MG PO TABS: 12.5000 mg | ORAL_TABLET | Freq: Two times a day (BID) | ORAL | 0 refills | Status: DC | PRN

## 2021-12-25 NOTE — Progress Notes (Signed)
I,Sulibeya S Dimas,acting as a Education administrator for [employer], PA-C.,have documented all relevant documentation on the behalf of Mardene Speak, PA-C,as directed by  [employer], PA-C while in the presence of [employer], PA-C.   Established patient visit   Patient: Anna Sweeney   DOB: [birth year]   71 y.o. Female  MRN: [id number] Visit Date: 12/25/2021  Today's healthcare provider: Mardene Speak, PA-C   Chief Complaint  Patient presents with   Dizziness   Subjective    Dizziness This is a new problem. Episode onset: 3 days. The problem occurs daily. The problem has been waxing and waning. Associated symptoms include nausea and weakness. Pertinent negatives include no abdominal pain, arthralgias, change in bowel habit, chest pain, chills, coughing, diaphoresis, fatigue, fever, headaches, joint swelling, myalgias, neck pain, numbness, rash, sore throat, urinary symptoms, visual change or vomiting. The symptoms are aggravated by standing and twisting. She has tried lying down for the symptoms. The treatment provided moderate relief.   DIZZINESS Duration: 3 days Description of symptoms: room spinning Duration of episode: seconds Dizziness frequency: once a day Aggravated by head movement: yes Aggravated by exertion, coughing, loud noises: no Recent head injury: no Recent or current viral symptoms: no History of vasovagal episodes: no Nausea: yes Vomiting: no Hearing loss: no Aural fullness: no Headache: no Unsteady gait: no Diplopia, dysarthria, dysphagia or weakness: no Related to exertion: no Diaphoresis: no Dyspnea: no Chest pain: no  Medications: Outpatient Medications Prior to Visit  Medication Sig   Accu-Chek Softclix Lancets lancets Use to check blood sugar daily for type 2 diabetes. E11.9   amLODipine (NORVASC) 5 MG tablet Take 1 tablet (5 mg total) by mouth daily.   aspirin EC 81 MG tablet Take 81 mg by mouth daily.   betamethasone dipropionate (DIPROLENE) 0.05 %  cream Apply topically 2 (two) times daily as needed.   Blood Glucose Monitoring Suppl (TRUE METRIX AIR GLUCOSE METER) w/Device KIT USE AS DIRECTED   Cholecalciferol (VITAMIN D-3) 125 MCG (5000 UT) TABS Take 5,000 Units by mouth daily.   Clobetasol Prop Emollient Base (CLOBETASOL PROPIONATE E) 0.05 % emollient cream Apply to aa's lower legs BID until clear. Then PRN thereafter. Avoid f/g/a.   Dulaglutide (TRULICITY) 3 NL/9.7QB SOPN Inject 3 mg as directed once a week. Patient receives through [employer] through Dec 2022   glipiZIDE (GLUCOTROL XL) 2.5 MG 24 hr tablet Take 1 tablet (2.5 mg total) by mouth daily with breakfast.   glucose blood (ACCU-CHEK AVIVA PLUS) test strip Use to check blood sugar daily for type 2 diabetes. E11.9   lovastatin (MEVACOR) 20 MG tablet TAKE 1 TABLET BY MOUTH (20 MG TOTAL) AT BEDTIME   mupirocin cream (BACTROBAN) 2 % Apply 1 application topically 2 (two) times daily.   neomycin-polymyxin-hydrocortisone (CORTISPORIN) OTIC solution Place 3 drops into both ears 2 (two) times daily as needed (itching).   trandolapril (MAVIK) 4 MG tablet TAKE 1 TABLET EVERY DAY   triamcinolone cream (KENALOG) 0.5 % Apply 1 application topically 2 (two) times daily.   verapamil (VERELAN PM) 240 MG 24 hr capsule TAKE 1 CAPSULE (240 MG TOTAL) BY MOUTH AT BEDTIME.   No facility-administered medications prior to visit.    Review of Systems  Constitutional:  Negative for chills, diaphoresis, fatigue and fever.  HENT:  Negative for sore throat.   Respiratory:  Negative for cough.   Cardiovascular:  Negative for chest pain.  Gastrointestinal:  Positive for nausea. Negative for abdominal pain, change in  bowel habit and vomiting.  Musculoskeletal:  Negative for arthralgias, joint swelling, myalgias and neck pain.  Skin:  Negative for rash.  Neurological:  Positive for dizziness and weakness. Negative for numbness and headaches.   Last CBC Lab Results  Component Value Date   WBC  7.6 12/02/2021   HGB 12.3 12/02/2021   HCT 36.9 12/02/2021   MCV 88 12/02/2021   MCH 29.4 12/02/2021   RDW 12.7 12/02/2021   PLT 226 09/73/5329   Last metabolic panel Lab Results  Component Value Date   GLUCOSE 175 (H) 12/02/2021   NA 140 12/02/2021   K 4.6 12/02/2021   CL 100 12/02/2021   CO2 19 (L) 12/02/2021   BUN 33 (H) 12/02/2021   CREATININE 2.69 (H) 12/02/2021   EGFR 18 (L) 12/02/2021   CALCIUM 9.9 12/02/2021   PHOS 4.2 02/18/2021   PROT 7.8 12/02/2021   ALBUMIN 4.8 12/02/2021   LABGLOB 3.0 12/02/2021   AGRATIO 1.6 12/02/2021   BILITOT 0.2 12/02/2021   ALKPHOS 79 12/02/2021   AST 24 12/02/2021   ALT 16 12/02/2021   ANIONGAP 12 08/18/2017   Last lipids Lab Results  Component Value Date   CHOL 173 12/02/2021   HDL 43 12/02/2021   LDLCALC 86 12/02/2021   TRIG 263 (H) 12/02/2021   CHOLHDL 4.0 12/02/2021   Last hemoglobin A1c Lab Results  Component Value Date   HGBA1C 7.3 (H) 12/02/2021   Last thyroid functions Lab Results  Component Value Date   TSH 1.097 08/18/2017       Objective    BP 122/60 (BP Location: Right Arm, Patient Position: Sitting, Cuff Size: Large)   Pulse 84   Temp 97.9 F (36.6 C) (Oral)   Resp 16   Wt 154 lb (69.9 kg)   SpO2 99%   BMI 24.86 kg/m  BP Readings from Last 3 Encounters:  12/25/21 122/60  12/02/21 (!) 136/57  06/23/21 (!) 122/58   Wt Readings from Last 3 Encounters:  12/25/21 154 lb (69.9 kg)  12/02/21 158 lb (71.7 kg)  06/23/21 152 lb (68.9 kg)      Physical Exam Vitals reviewed.  Constitutional:      Appearance: Normal appearance.  HENT:     Head: Normocephalic and atraumatic.     Nose: Nose normal.  Eyes:     Extraocular Movements: Extraocular movements intact.     Pupils: Pupils are equal, round, and reactive to light.  Cardiovascular:     Rate and Rhythm: Normal rate and regular rhythm.     Pulses: Normal pulses.     Heart sounds: Normal heart sounds.  Pulmonary:     Effort: Pulmonary effort  is normal.     Breath sounds: Normal breath sounds.  Musculoskeletal:        General: Normal range of motion.  Skin:    General: Skin is warm.  Neurological:     General: No focal deficit present.     Mental Status: She is alert and oriented to person, place, and time.     Cranial Nerves: No cranial nerve deficit.     Sensory: No sensory deficit.     Motor: No weakness.     Coordination: Coordination normal.     Gait: Gait normal.     Deep Tendon Reflexes: Reflexes normal.  Psychiatric:        Behavior: Behavior normal.        Thought Content: Thought content normal.        Judgment:  Judgment normal.    Marye Round to the right was negative, to the left was inconclusive with eliciting vertigo, nausea, unclear nystagmus, pt closed her eyes and got very nauseous to continue with test.   No results found for any visits on 12/25/21.  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 Rx meclizine PRN  Fu in 2 weeks with Dr. Caryn Section She might need adjustments of her medications    The patient was advised to call back or seek an in-person evaluation if the symptoms worsen or if the condition fails to improve as anticipated.  I discussed the assessment and treatment plan with the patient. The patient was provided an opportunity to ask questions and all were answered. The patient agreed with the plan and demonstrated an understanding of the instructions.  The entirety of the information documented in the History of Present Illness, Review of Systems and Physical Exam were  personally obtained by me. Portions of this information were initially documented by the CMA and reviewed by me for thoroughness and accuracy.  Portions of this note were created using dictation software and may contain typographical errors.     Mardene Speak, PA-C  [facility] [phone] (phone) [phone] (fax)  [place]

## 2021-12-25 NOTE — Telephone Encounter (Signed)
Reason for Disposition  [1] MODERATE dizziness (e.g., interferes with normal activities) AND [2] has NOT been evaluated by physician for this  (Exception: dizziness caused by heat exposure, sudden standing, or poor fluid intake)  Answer Assessment - Initial Assessment Questions 1. DESCRIPTION: "Describe your dizziness."     Wed. Afternoon I didn't feel good.   Lightheaded. 2. LIGHTHEADED: "Do you feel lightheaded?" (e.g., somewhat faint, woozy, weak upon standing)     Yes and nausea.    Yesterday again I was lightheaded in the afternoon.   I had to lay down.   I'm very lightheaded this morning and having nausea.     My Trulicity was increased it's giving me indigestion.     He stopped my metformin in April.      I told Dr. Sherrie Mustache about the indigestion when I saw him last.      3. VERTIGO: "Do you feel like either you or the room is spinning or tilting?" (i.e. vertigo)     *No Answer* 4. SEVERITY: "How bad is it?"  "Do you feel like you are going to faint?" "Can you stand and walk?"   - MILD: Feels slightly dizzy, but walking normally.   - MODERATE: Feels unsteady when walking, but not falling; interferes with normal activities (e.g., school, work).   - SEVERE: Unable to walk without falling, or requires assistance to walk without falling; feels like passing out now.      *No Answer* 5. ONSET:  "When did the dizziness begin?"     Still having it today.    Last 3 days the dizziness is happening.   6. AGGRAVATING FACTORS: "Does anything make it worse?" (e.g., standing, change in head position)     *No Answer* 7. HEART RATE: "Can you tell me your heart rate?" "How many beats in 15 seconds?"  (Note: not all patients can do this)       *No Answer* 8. CAUSE: "What do you think is causing the dizziness?"     *No Answer* 9. RECURRENT SYMPTOM: "Have you had dizziness before?" If Yes, ask: "When was the last time?" "What happened that time?"     *No Answer* 10. OTHER SYMPTOMS: "Do you have any other  symptoms?" (e.g., fever, chest pain, vomiting, diarrhea, bleeding)       *No Answer* 11. PREGNANCY: "Is there any chance you are pregnant?" "When was your last menstrual period?"       *No Answer*  Protocols used: Dizziness - Lightheadedness-A-AH  Chief Complaint: dizziness, lightheaded for last 3 days.   Today mild nausea Symptoms: has to ambulate carefully due to lightheadedness. Frequency: last 3 days Pertinent Negatives: Patient denies being sick recently with vomiting or diarrhea.   "I'm drinking a lot of water every day at Dr. Theodis Aguas instruction".   Disposition: [] ED /[] Urgent Care (no appt availability in office) / [x] Appointment(In office/virtual)/ []  [facility] [facility]/ [] Home Care/ [] Refused Recommended Disposition /[] [facility] [facility]/ []  Follow-up with PCP Additional Notes: Appt made for today with , PA-C at 1:00.

## 2022-01-08 ENCOUNTER — Encounter: Payer: Self-pay | Admitting: Family Medicine

## 2022-01-08 ENCOUNTER — Ambulatory Visit (INDEPENDENT_AMBULATORY_CARE_PROVIDER_SITE_OTHER): Payer: Medicare PPO | Admitting: Family Medicine

## 2022-01-08 VITALS — BP 119/63 | HR 80 | Temp 98.2°F | Resp 16 | Wt 151.0 lb

## 2022-01-08 DIAGNOSIS — R42 Dizziness and giddiness: Secondary | ICD-10-CM

## 2022-01-08 NOTE — Progress Notes (Signed)
I,Joshlyn Beadle L Keshav Winegar,acting as a scribe for Lelon Huh, MD.,have documented all relevant documentation on the behalf of Lelon Huh, MD,as directed by  Lelon Huh, MD while in the presence of Lelon Huh, MD.   Established patient visit   Patient: Anna Sweeney   DOB: 09-03-1950   71 y.o. Female  MRN: [id number] Visit Date: 01/08/2022  Today's healthcare provider: Lelon Huh, MD   Chief Complaint  Patient presents with   Dizziness   Subjective    Dizziness Pertinent negatives include no abdominal pain, chest pain, chills, fatigue, fever, nausea, vomiting or weakness.   Patient was seen for this problem on 12/25/2021 by Mardene Speak, PA-C. During that visit, patient was provided instructions on how to perform Epley maneuver. A prescription for meclizine PRN was given and patient was advised to follow up in 2 weeks. Today patient reports the dizziness has slightly improved. She has taken Meclizine 3 times since the last visit. She has also been working on slow position changed. Has taken meclizine 2-3 times and it has helped with dizziness, but makes her very sleepy. She states she feels off balance and not really having spinning sensation, but that [employer] maneuver at last visit caused severe dizziness, and has had a few severe episodes triggered by home Eppley maneuvers.      Medications: Outpatient Medications Prior to Visit  Medication Sig   Accu-Chek Softclix Lancets lancets Use to check blood sugar daily for type 2 diabetes. E11.9   amLODipine (NORVASC) 5 MG tablet Take 1 tablet (5 mg total) by mouth daily.   aspirin EC 81 MG tablet Take 81 mg by mouth daily.   betamethasone dipropionate (DIPROLENE) 0.05 % cream Apply topically 2 (two) times daily as needed.   Blood Glucose Monitoring Suppl (TRUE METRIX AIR GLUCOSE METER) w/Device KIT USE AS DIRECTED   Cholecalciferol (VITAMIN D-3) 125 MCG (5000 UT) TABS Take 5,000 Units by mouth daily.   Clobetasol Prop  Emollient Base (CLOBETASOL PROPIONATE E) 0.05 % emollient cream Apply to aa's lower legs BID until clear. Then PRN thereafter. Avoid f/g/a.   Dulaglutide (TRULICITY) 3 ZE/0.9QZ SOPN Inject 3 mg as directed once a week. Patient receives through [employer] through Dec 2022   glipiZIDE (GLUCOTROL XL) 2.5 MG 24 hr tablet Take 1 tablet (2.5 mg total) by mouth daily with breakfast.   glucose blood (ACCU-CHEK AVIVA PLUS) test strip Use to check blood sugar daily for type 2 diabetes. E11.9   lovastatin (MEVACOR) 20 MG tablet TAKE 1 TABLET BY MOUTH (20 MG TOTAL) AT BEDTIME   meclizine (ANTIVERT) 12.5 MG tablet Take 1 tablet (12.5 mg total) by mouth 3 (three) times daily as needed for dizziness.   mupirocin cream (BACTROBAN) 2 % Apply 1 application topically 2 (two) times daily.   neomycin-polymyxin-hydrocortisone (CORTISPORIN) OTIC solution Place 3 drops into both ears 2 (two) times daily as needed (itching).   trandolapril (MAVIK) 4 MG tablet TAKE 1 TABLET EVERY DAY   triamcinolone cream (KENALOG) 0.5 % Apply 1 application topically 2 (two) times daily.   verapamil (VERELAN PM) 240 MG 24 hr capsule TAKE 1 CAPSULE (240 MG TOTAL) BY MOUTH AT BEDTIME.   No facility-administered medications prior to visit.    Review of Systems  Constitutional:  Negative for appetite change, chills, fatigue and fever.  Respiratory:  Negative for chest tightness and shortness of breath.   Cardiovascular:  Negative for chest pain and palpitations.  Gastrointestinal:  Negative for abdominal pain, nausea  and vomiting.  Neurological:  Positive for dizziness. Negative for weakness.      Objective    BP 119/63 (BP Location: Left Arm, Patient Position: Sitting, Cuff Size: Normal)   Pulse 80   Temp 98.2 F (36.8 C) (Oral)   Resp 16   Wt 151 lb (68.5 kg)   SpO2 100% Comment: room air  BMI 24.37 kg/m    Physical Exam   General: Appearance:    Well developed, well nourished female in no acute distress   Eyes:    PERRL, conjunctiva/corneas clear, EOM's intact       Lungs:     Clear to auscultation bilaterally, respirations unlabored  Heart:    Normal heart rate. Normal rhythm. No murmurs, rubs, or gallops.    MS:   All extremities are intact.    Neurologic:   Awake, alert, oriented x 3. No apparent focal neurological defect.         Assessment & Plan     1. Dizziness Overall improving but not resolved.  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 Will check back in with her in a few weeks to ensure she is improving. If not continuing to improve will consider neuroimaging.         The entirety of the information documented in the History of Present Illness, Review of Systems and Physical Exam were personally obtained by me. Portions of this information were initially documented by the CMA and reviewed by me for thoroughness and accuracy. Lelon Huh, MD  [facility] [phone] (phone) [phone] (fax)  [place]

## 2022-01-25 ENCOUNTER — Other Ambulatory Visit: Payer: Self-pay | Admitting: Nephrology

## 2022-01-25 DIAGNOSIS — I10 Essential (primary) hypertension: Secondary | ICD-10-CM

## 2022-01-26 ENCOUNTER — Other Ambulatory Visit: Payer: Self-pay | Admitting: Physician Assistant

## 2022-01-26 DIAGNOSIS — R42 Dizziness and giddiness: Secondary | ICD-10-CM

## 2022-01-26 NOTE — Telephone Encounter (Signed)
Requested medication (s) are due for refill today - provider review- acute visit Rx  Requested medication (s) are on the active medication list -yes  Future visit scheduled -yes  Last refill: 12/25/21 #15  Notes to clinic: non delegated Rx  Requested Prescriptions  Pending Prescriptions Disp Refills   meclizine (ANTIVERT) 12.5 MG tablet [Pharmacy Med Name: MECLIZINE 12.5 MG TABLET] 30 tablet     Sig: TAKE 1 TABLET (12.5 MG TOTAL) BY MOUTH 2 (TWO) TIMES DAILY AS NEEDED FOR DIZZINESS.     Not Delegated - Gastroenterology: Antiemetics Failed - 01/26/2022 12:15 PM      Failed - This refill cannot be delegated      Passed - Valid encounter within last 6 months    Recent Outpatient Visits           2 weeks ago Dizziness   [facility] Malva Limes, MD   1 month ago Dizziness   [facility], [place], PA-C   1 month ago Annual physical exam   [facility] Malva Limes, MD   7 months ago Primary hypertension   [facility] Malva Limes, MD   11 months ago Type 2 diabetes mellitus without complication, without long-term current use of insulin [facility])   [facility] Malva Limes, MD       Future Appointments             In 1 week Fisher, Demetrios Isaacs, MD [facility]               Requested Prescriptions  Pending Prescriptions Disp Refills   meclizine (ANTIVERT) 12.5 MG tablet [Pharmacy Med Name: MECLIZINE 12.5 MG TABLET] 30 tablet     Sig: TAKE 1 TABLET (12.5 MG TOTAL) BY MOUTH 2 (TWO) TIMES DAILY AS NEEDED FOR DIZZINESS.     Not Delegated - Gastroenterology: Antiemetics Failed - 01/26/2022 12:15 PM      Failed - This refill cannot be delegated      Passed - Valid encounter within last 6 months    Recent Outpatient Visits           2 weeks ago Dizziness   [facility] Malva Limes, MD   1 month ago Dizziness   [facility]  [place], [place], PA-C   1 month ago Annual physical exam   [facility] Malva Limes, MD   7 months ago Primary hypertension   [facility] Malva Limes, MD   11 months ago Type 2 diabetes mellitus without complication, without long-term current use of insulin [facility])   [facility] Malva Limes, MD       Future Appointments             In 1 week Fisher, Demetrios Isaacs, MD [facility]

## 2022-01-27 ENCOUNTER — Ambulatory Visit
Admission: RE | Admit: 2022-01-27 | Discharge: 2022-01-27 | Disposition: A | Discharge status: Home / Self Care | Payer: Medicare PPO | Source: Ambulatory Visit | Attending: Nephrology | Admitting: Nephrology

## 2022-01-27 DIAGNOSIS — I129 Hypertensive chronic kidney disease with stage 1 through stage 4 chronic kidney disease, or unspecified chronic kidney disease: Secondary | ICD-10-CM | POA: Insufficient documentation

## 2022-01-27 DIAGNOSIS — I10 Essential (primary) hypertension: Secondary | ICD-10-CM | POA: Diagnosis present

## 2022-01-27 DIAGNOSIS — N281 Cyst of kidney, acquired: Secondary | ICD-10-CM | POA: Diagnosis not present

## 2022-01-27 DIAGNOSIS — N189 Chronic kidney disease, unspecified: Secondary | ICD-10-CM | POA: Diagnosis present

## 2022-01-27 IMAGING — US US RENAL
1 series · 14 of 25 positions shown · non-contrast
Comparison: Prior renal ultrasound and MR abdomen

CLINICAL DATA: Chronic kidney disease

EXAM:
RENAL / URINARY TRACT ULTRASOUND COMPLETE

[Series 1: us renal · 0.22mm/px · 52 acquisitions, 14 frames shown]
[im 1/52]
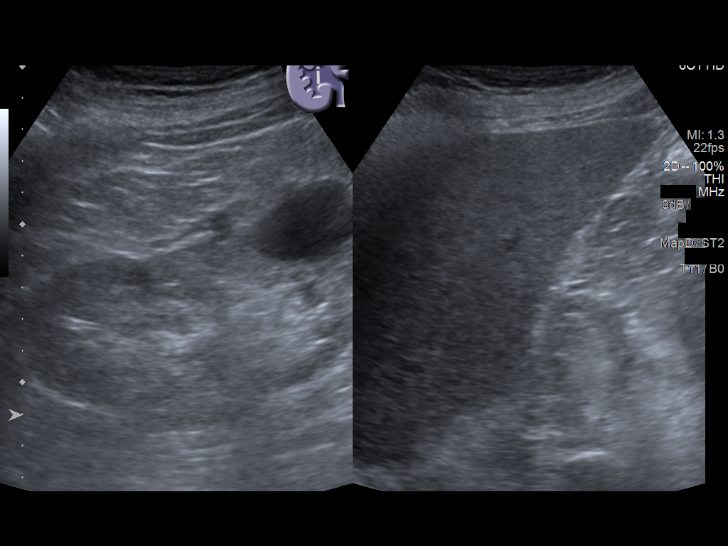
[im 5/52]
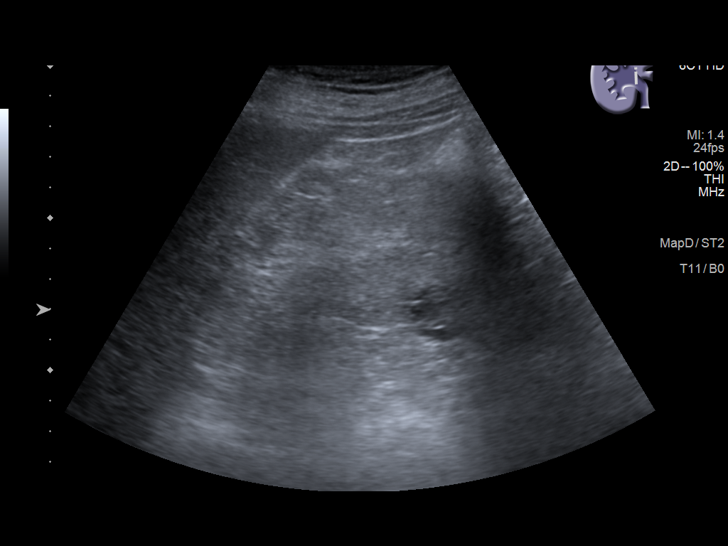
[im 9/52]
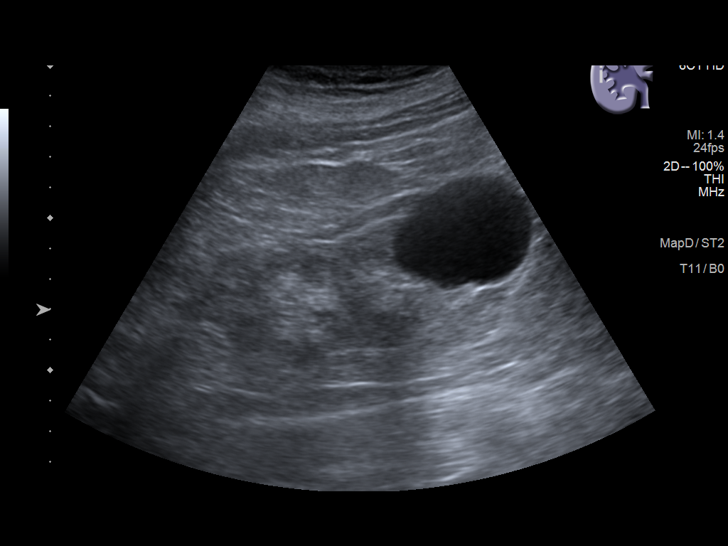
[im 13/52]
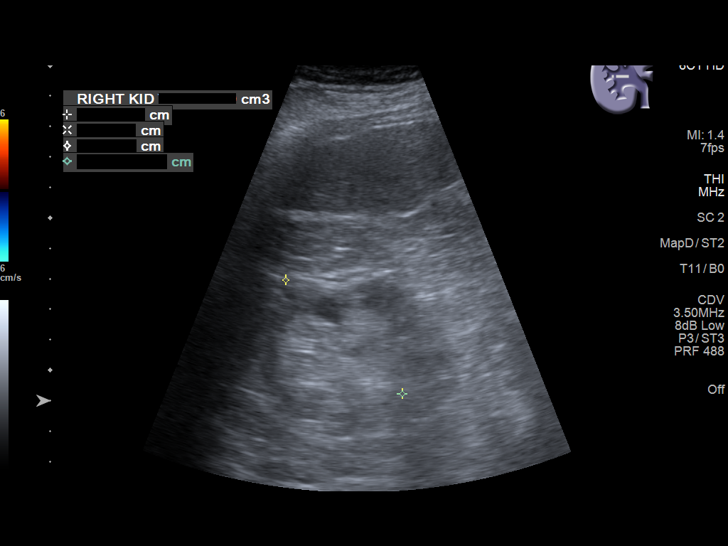
[im 18/52]
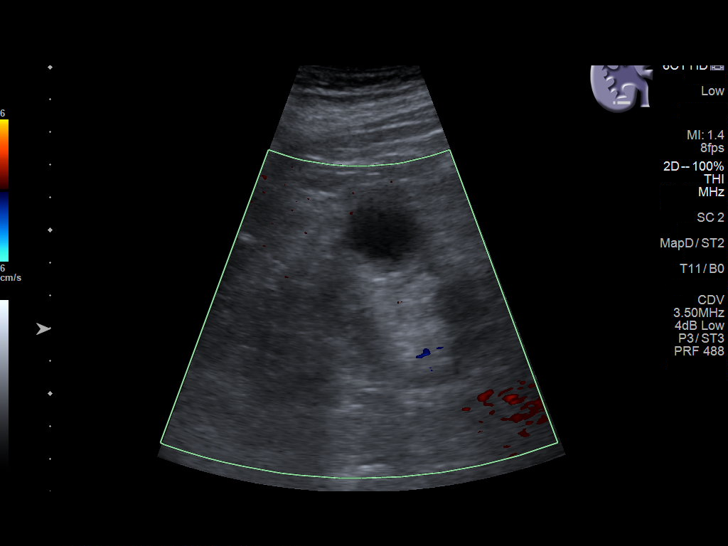
[im 20/52]
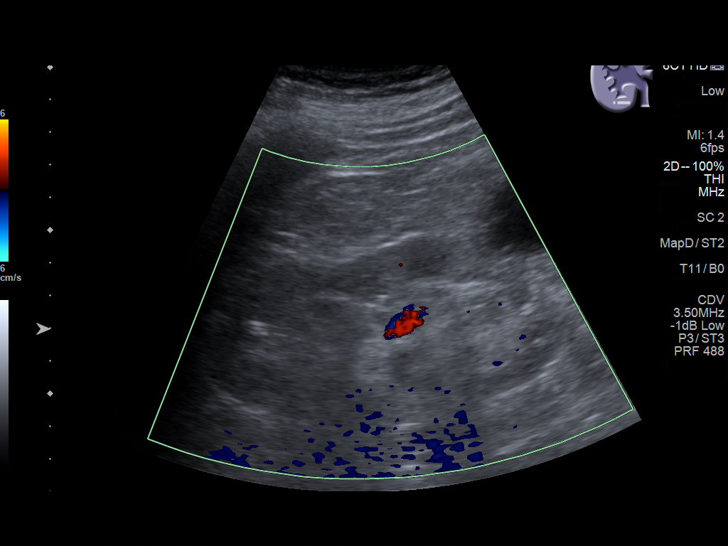
[im 24/52]
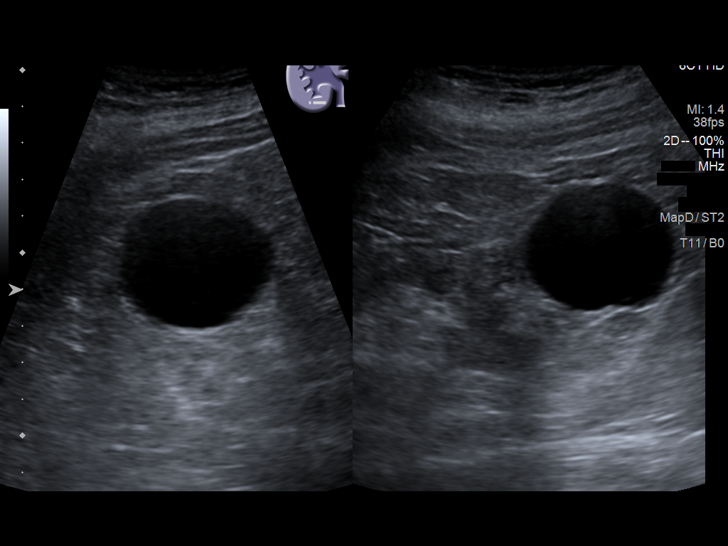
[im 28/52]
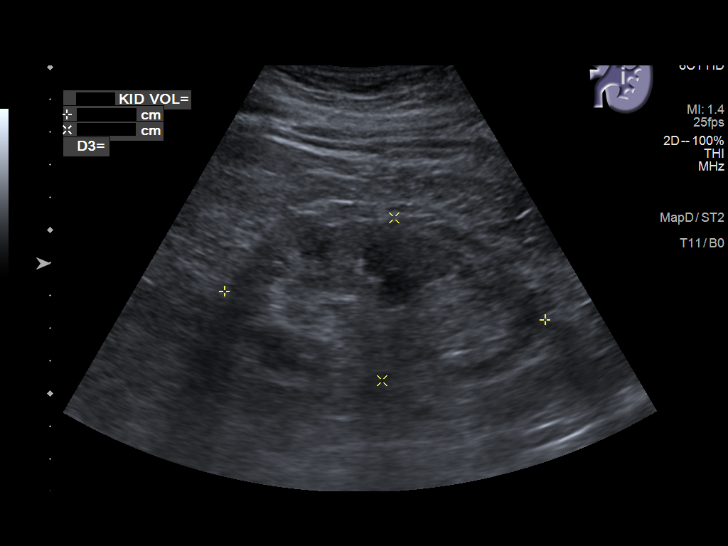
[im 32/52]
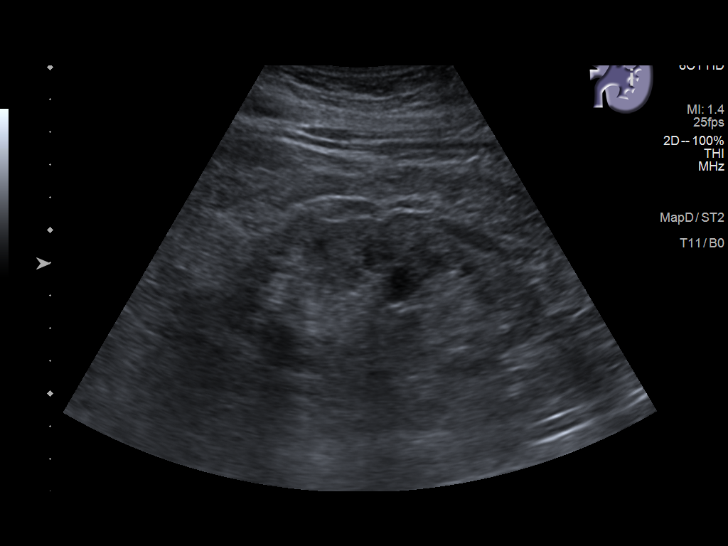
[im 35/52]
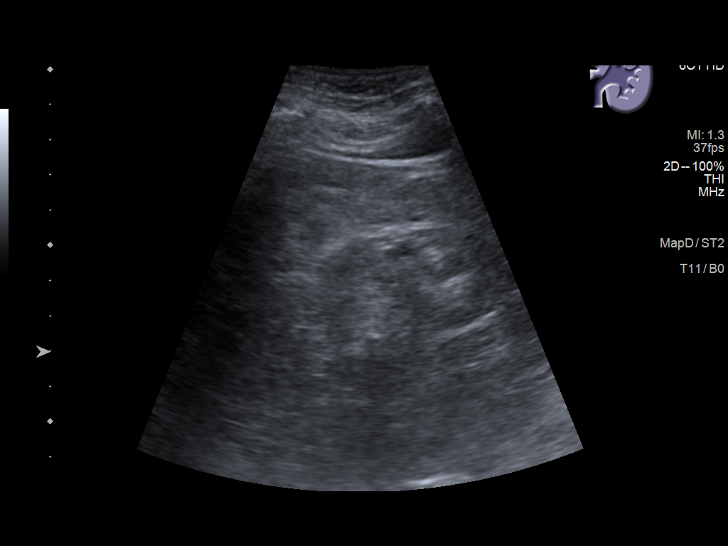
[im 39/52]
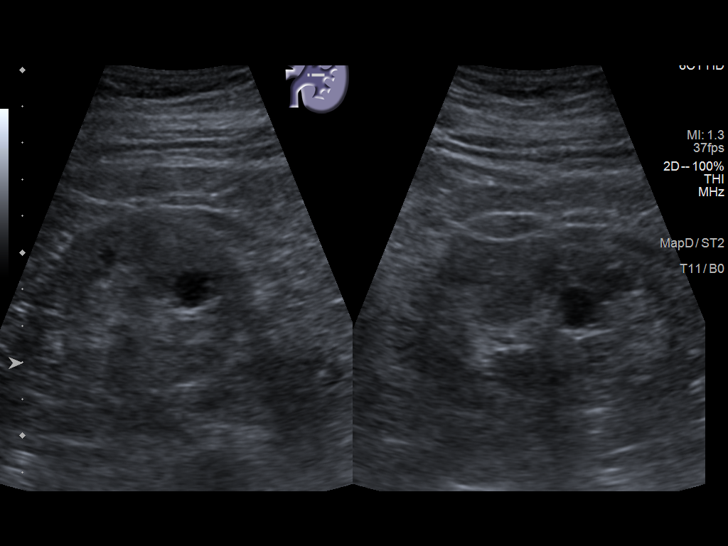
[im 43/52]
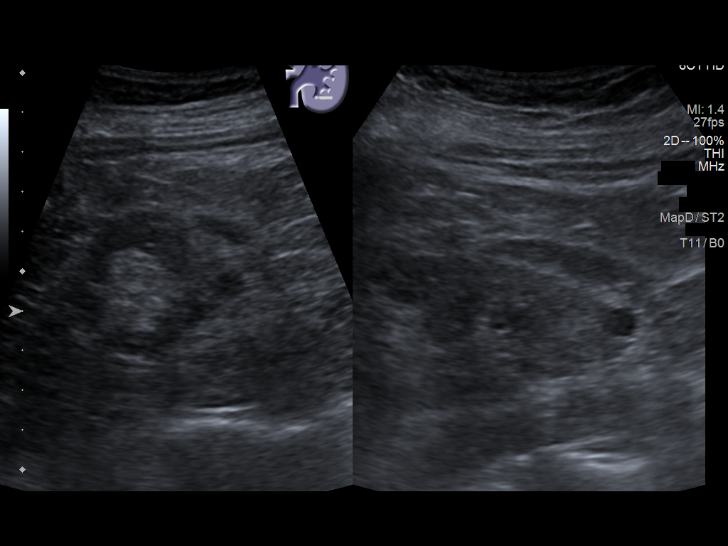
[im 47/52]
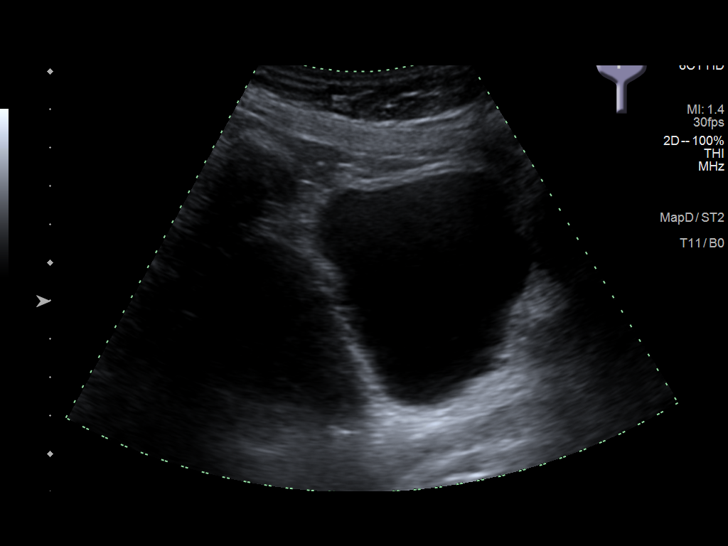
[im 52/52]
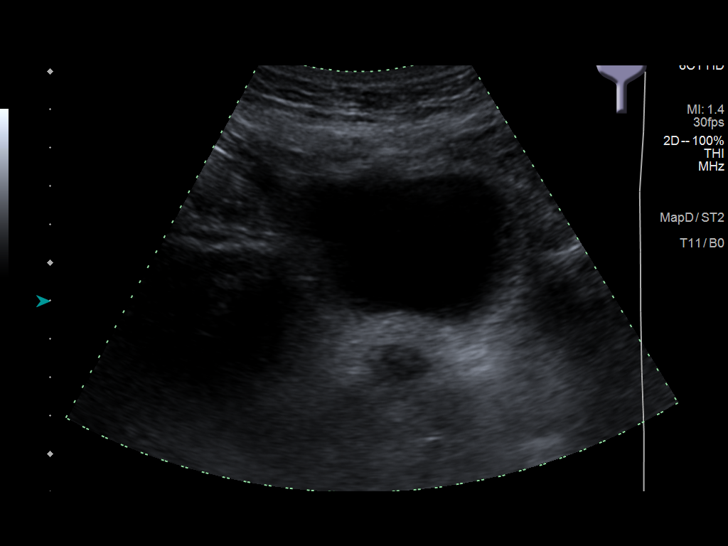

[14 of 25 positions shown; findings below may reference images not displayed]

FINDINGS: Right Kidney:

Renal measurements: 10.7 x 5.1 x 5.4 cm = volume: 153 mL.
Echogenicity is increased. Simple cyst at the lower pole measuring
up to 4.6 cm. Additional complex cyst at the lower pole measuring
2.6 cm. No hydronephrosis visualized.

Left Kidney:

Renal measurements: 9.9 x 6 x 4.3 cm = volume: 104 mL. Echogenicity
is increased. Cysts at the lower pole measuring up to 1 cm. No
hydronephrosis visualized.

Bladder:

Appears normal for degree of bladder distention.

Other:

None.
IMPRESSION: 2.6 cm Bosniak 3 complex cyst at the lower pole of the right kidney.
This was not seen on the prior study so comparison is not possible.
Unclear if difference in size in comparison to prior MRI represents
a true increase.

Increased renal echogenicity likely reflecting medical renal
disease. No hydronephrosis.

## 2022-02-05 NOTE — Progress Notes (Deleted)
      Established patient visit   Patient: Anna Sweeney   DOB: 16-Dec-1950   71 y.o. Female  MRN: [id number] Visit Date: 02/08/2022  Today's healthcare provider: Lelon Huh, MD   No chief complaint on file.  Subjective    HPI    Medications: Outpatient Medications Prior to Visit  Medication Sig   Accu-Chek Softclix Lancets lancets Use to check blood sugar daily for type 2 diabetes. E11.9   amLODipine (NORVASC) 5 MG tablet Take 1 tablet (5 mg total) by mouth daily.   aspirin EC 81 MG tablet Take 81 mg by mouth daily.   betamethasone dipropionate (DIPROLENE) 0.05 % cream Apply topically 2 (two) times daily as needed.   Blood Glucose Monitoring Suppl (TRUE METRIX AIR GLUCOSE METER) w/Device KIT USE AS DIRECTED   Cholecalciferol (VITAMIN D-3) 125 MCG (5000 UT) TABS Take 5,000 Units by mouth daily.   Clobetasol Prop Emollient Base (CLOBETASOL PROPIONATE E) 0.05 % emollient cream Apply to aa's lower legs BID until clear. Then PRN thereafter. Avoid f/g/a.   Dulaglutide (TRULICITY) 3 HT/9.7FS SOPN Inject 3 mg as directed once a week. Patient receives through [employer] through Dec 2022   glipiZIDE (GLUCOTROL XL) 2.5 MG 24 hr tablet Take 1 tablet (2.5 mg total) by mouth daily with breakfast.   glucose blood (ACCU-CHEK AVIVA PLUS) test strip Use to check blood sugar daily for type 2 diabetes. E11.9   lovastatin (MEVACOR) 20 MG tablet TAKE 1 TABLET BY MOUTH (20 MG TOTAL) AT BEDTIME   meclizine (ANTIVERT) 12.5 MG tablet TAKE 1 TABLET (12.5 MG TOTAL) BY MOUTH 2 (TWO) TIMES DAILY AS NEEDED FOR DIZZINESS.   mupirocin cream (BACTROBAN) 2 % Apply 1 application topically 2 (two) times daily.   neomycin-polymyxin-hydrocortisone (CORTISPORIN) OTIC solution Place 3 drops into both ears 2 (two) times daily as needed (itching).   trandolapril (MAVIK) 4 MG tablet TAKE 1 TABLET EVERY DAY   triamcinolone cream (KENALOG) 0.5 % Apply 1 application topically 2 (two) times daily.   verapamil  (VERELAN PM) 240 MG 24 hr capsule TAKE 1 CAPSULE (240 MG TOTAL) BY MOUTH AT BEDTIME.   No facility-administered medications prior to visit.    Review of Systems  {Labs  Heme  Chem  Endocrine  Serology  Results Review (optional):23779}   Objective    There were no vitals taken for this visit. {Show previous vital signs (optional):23777}  Physical Exam  ***  No results found for any visits on 02/08/22.  Assessment & Plan     ***  No follow-ups on file.      {provider attestation***:1}   Lelon Huh, MD  [facility] [phone] (phone) [phone] (fax)  [place]

## 2022-02-08 ENCOUNTER — Ambulatory Visit: Payer: Medicare PPO | Admitting: Family Medicine

## 2022-02-08 ENCOUNTER — Ambulatory Visit (INDEPENDENT_AMBULATORY_CARE_PROVIDER_SITE_OTHER): Payer: Medicare PPO | Admitting: Family Medicine

## 2022-02-08 ENCOUNTER — Encounter: Payer: Self-pay | Admitting: Family Medicine

## 2022-02-08 VITALS — BP 134/67 | HR 85 | Temp 98.1°F | Resp 16 | Wt 152.0 lb

## 2022-02-08 DIAGNOSIS — E1122 Type 2 diabetes mellitus with diabetic chronic kidney disease: Secondary | ICD-10-CM | POA: Diagnosis not present

## 2022-02-08 DIAGNOSIS — N1832 Chronic kidney disease, stage 3b: Secondary | ICD-10-CM

## 2022-02-08 DIAGNOSIS — N179 Acute kidney failure, unspecified: Secondary | ICD-10-CM

## 2022-02-08 LAB — POCT GLYCOSYLATED HEMOGLOBIN (HGB A1C)
Est. average glucose Bld gHb Est-mCnc: 146
Hemoglobin A1C: 6.7 % — AB (ref 4.0–5.6)

## 2022-02-08 NOTE — Progress Notes (Signed)
Established patient visit   Patient: Anna Sweeney   DOB: [birth year]   71 y.o. Female  MRN: [id number] Visit Date: 02/08/2022  Today's healthcare provider: Lelon Huh, MD   No chief complaint on file.  Subjective    HPI  Diabetes Mellitus Type II, Follow-up  Lab Results  Component Value Date   HGBA1C 7.3 (H) 12/02/2021   HGBA1C 6.5 (A) 06/23/2021   HGBA1C 7.5 (A) 02/17/2021   Wt Readings from Last 3 Encounters:  01/08/22 151 lb (68.5 kg)  12/25/21 154 lb (69.9 kg)  12/02/21 158 lb (71.7 kg)   Last seen for diabetes 2 months ago.  Management since then includes stopping Metformin due to worsened kidney functions. Referred to nephrology.  Added glipizide xl 2.4m once a day. She reports good compliance with treatment. Since last visit, patient reports that Dr. SCandiss Norsediscontinued Trandolapril. She is not having side effects.  Symptoms: No fatigue No foot ulcerations  No appetite changes No nausea  No paresthesia of the feet  No polydipsia  No polyuria No visual disturbances   No vomiting     Home blood sugar records: blood sugars are rarely checked  Episodes of hypoglycemia? No    Current insulin regiment: none Most Recent Eye Exam: >1 year ago Current exercise: none Current diet habits: well balanced  Pertinent Labs: Lab Results  Component Value Date   CHOL 173 12/02/2021   HDL 43 12/02/2021   LDLCALC 86 12/02/2021   TRIG 263 (H) 12/02/2021   CHOLHDL 4.0 12/02/2021   Lab Results  Component Value Date   NA 140 12/02/2021   K 4.6 12/02/2021   CREATININE 2.69 (H) 12/02/2021   EGFR 18 (L) 12/02/2021   MICROALBUR negative 06/22/2017   LABMICR 19.5 12/02/2021     ---------------------------------------------------------------------------------------------------   Follow up for Dizziness:  The patient was last seen for this on 01/08/2022.   During that visit no changed were made. If not improving after a few weeks, will consider  neuroimaging.  She reports good compliance with treatment. She feels that condition is Improved. She is not having side effects.   -----------------------------------------------------------------------------------------   Since her last visit she was referred to nephrology for elevated creatinine. She is noted to have had further decline of eGFR to 8 and elevated free kappa light chains and lambda light chains, although ratio was only slightly elevated.  SPEP was normal. 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  Medications: Outpatient Medications Prior to Visit  Medication Sig   Accu-Chek Softclix Lancets lancets Use to check blood sugar daily for type 2 diabetes. E11.9   amLODipine (NORVASC) 5 MG tablet Take 1 tablet (5 mg total) by mouth daily.   aspirin EC 81 MG tablet Take 81 mg by mouth daily.   betamethasone dipropionate (DIPROLENE) 0.05 % cream Apply topically 2 (two) times daily as needed.   Blood Glucose Monitoring Suppl (TRUE METRIX AIR GLUCOSE METER) w/Device KIT USE AS DIRECTED   Cholecalciferol (VITAMIN D-3) 125 MCG (5000 UT) TABS Take 5,000 Units by mouth daily.   Clobetasol Prop Emollient Base (CLOBETASOL PROPIONATE E) 0.05 % emollient cream Apply to aa's lower legs BID until clear. Then PRN thereafter. Avoid f/g/a.   Dulaglutide (TRULICITY) 3 MZG/0.1VCSOPN Inject 3 mg as directed once a week. [facility]   glipiZIDE (GLUCOTROL XL) 2.5 MG 24 hr tablet Take 1 tablet (2.5 mg total) by mouth daily with breakfast.  glucose blood (ACCU-CHEK AVIVA PLUS) test strip Use to check blood sugar daily for type 2 diabetes. E11.9   lovastatin (MEVACOR) 20 MG tablet TAKE 1 TABLET BY MOUTH (20 MG TOTAL) AT BEDTIME   meclizine (ANTIVERT) 12.5 MG tablet TAKE 1 TABLET (12.5 MG TOTAL) BY MOUTH 2 (TWO) TIMES DAILY AS NEEDED FOR DIZZINESS.   mupirocin cream (BACTROBAN) 2 % Apply 1 application topically 2 (two)  times daily.   neomycin-polymyxin-hydrocortisone (CORTISPORIN) OTIC solution Place 3 drops into both ears 2 (two) times daily as needed (itching).   triamcinolone cream (KENALOG) 0.5 % Apply 1 application topically 2 (two) times daily.   verapamil (VERELAN PM) 240 MG 24 hr capsule TAKE 1 CAPSULE (240 MG TOTAL) BY MOUTH AT BEDTIME.   trandolapril (MAVIK) 4 MG tablet TAKE 1 TABLET EVERY DAY (Patient not taking: Reported on 02/08/2022)   No facility-administered medications prior to visit.    Review of Systems     Objective    BP 134/67 (BP Location: Left Arm, Patient Position: Sitting, Cuff Size: Normal)   Pulse 85   Temp 98.1 F (36.7 C) (Oral)   Resp 16   Wt 152 lb (68.9 kg)   SpO2 99% Comment: room air  BMI 24.53 kg/m    Physical Exam    General: Appearance:    Well developed, well nourished female in no acute distress  Eyes:    PERRL, conjunctiva/corneas clear, EOM's intact       Lungs:     Clear to auscultation bilaterally, respirations unlabored  Heart:    Normal heart rate. Normal rhythm. No murmurs, rubs, or gallops.    MS:   All extremities are intact.    Neurologic:   Awake, alert, oriented x 3. No apparent focal neurological defect.        Results for orders placed or performed in visit on 02/08/22  POCT HgB A1C  Result Value Ref Range   Hemoglobin A1C 6.7 (A) 4.0 - 5.6 %   Est. average glucose Bld gHb Est-mCnc 146      Assessment & Plan     1. Type 2 diabetes mellitus with stage 3b chronic kidney disease, without long-term current use of insulin (HCC) Off metformin due to AKI. A1c remains well controlled on current medication regiment.  - Renal function panel - CBC   2. AKI (acute kidney injury) ([place]) She feels well, but had rapid rise in creatinine. Recheck today to ensure stability. Work up has been started by Dr. Candiss Norse      The entirety of the information documented in the History of Present Illness, Review of Systems and Physical Exam were personally  obtained by me. Portions of this information were initially documented by the CMA and reviewed by me for thoroughness and accuracy.     Lelon Huh, MD  [facility] [phone] (phone) [phone] (fax)  [place]

## 2022-02-09 LAB — RENAL FUNCTION PANEL
Albumin: 4.9 g/dL — ABNORMAL HIGH (ref 3.8–4.8)
BUN/Creatinine Ratio: 12 (ref 12–28)
BUN: 46 mg/dL — ABNORMAL HIGH (ref 8–27)
CO2: 19 mmol/L — ABNORMAL LOW (ref 20–29)
Calcium: 9.7 mg/dL (ref 8.7–10.3)
Chloride: 103 mmol/L (ref 96–106)
Creatinine, Ser: 3.76 mg/dL — ABNORMAL HIGH (ref 0.57–1.00)
Glucose: 95 mg/dL (ref 70–99)
Phosphorus: 3.9 mg/dL (ref 3.0–4.3)
Potassium: 4.7 mmol/L (ref 3.5–5.2)
Sodium: 142 mmol/L (ref 134–144)
eGFR: 12 mL/min/{1.73_m2} — ABNORMAL LOW (ref >59)

## 2022-02-09 LAB — CBC
Hematocrit: 34.1 % (ref 34.0–46.6)
Hemoglobin: 11.2 g/dL (ref 11.1–15.9)
MCH: 28.6 pg (ref 26.6–33.0)
MCHC: 32.8 g/dL (ref 31.5–35.7)
MCV: 87 fL (ref 79–97)
Platelets: 187 10*3/uL (ref 150–450)
RBC: 3.91 x10E6/uL (ref 3.77–5.28)
RDW: 13.4 % (ref 11.7–15.4)
WBC: 6 10*3/uL (ref 3.4–10.8)

## 2022-02-25 ENCOUNTER — Other Ambulatory Visit: Payer: Self-pay | Admitting: Family Medicine

## 2022-02-28 ENCOUNTER — Other Ambulatory Visit: Payer: Self-pay | Admitting: Family Medicine

## 2022-03-15 ENCOUNTER — Other Ambulatory Visit: Payer: Self-pay | Admitting: Family Medicine

## 2022-03-15 DIAGNOSIS — I10 Essential (primary) hypertension: Secondary | ICD-10-CM

## 2022-04-07 ENCOUNTER — Encounter

## 2022-04-27 ENCOUNTER — Telehealth: Payer: Self-pay | Admitting: Family Medicine

## 2022-04-27 DIAGNOSIS — N179 Acute kidney failure, unspecified: Secondary | ICD-10-CM

## 2022-04-27 MED ORDER — GLIPIZIDE ER 2.5 MG PO TB24: 2.5000 mg | ORAL_TABLET | Freq: Every day | ORAL | 0 refills | Status: DC

## 2022-04-27 NOTE — Telephone Encounter (Signed)
[facility] faxed refill request for the following medications:  glipiZIDE (GLUCOTROL XL) 2.5 MG 24 hr tablet   Please advise.

## 2022-04-27 NOTE — Addendum Note (Signed)
Addended by: [name] on: 04/27/2022 10:52 AM   Modules accepted: Orders

## 2022-04-27 NOTE — Telephone Encounter (Signed)
Last office visit was 02/08/2022. Labs were ordered and reviewed by Dr. Caryn Section stating:   "Kidney functions have improved since seen by nephrologist, although not back to normal. Keep trandolapril on hold. Push fluids. Need to recheck labs in one month. Will contact her there first of august to set up labs. "  Does patient need repeat labs? I don't see that labs were repeated in August. Is it ok to send in refills for Glipizide?

## 2022-04-27 NOTE — Telephone Encounter (Signed)
Spoke with patient.

## 2022-04-27 NOTE — Telephone Encounter (Signed)
Please advise have sent refill. Is due to recheck kidney functions. Have placed order. I see that she has appt with Dr. Candiss Norse in mid October, but we need to go ahead and get kidney functions checked noww

## 2022-04-27 NOTE — Addendum Note (Signed)
Addended by: Randal Buba on: 04/27/2022 10:45 AM   Modules accepted: Orders

## 2022-05-04 ENCOUNTER — Ambulatory Visit: Primary: Medical

## 2022-05-04 LAB — RENAL FUNCTION PANEL
Albumin: 4.3 g/dL (ref 3.9–4.9)
BUN/Creatinine Ratio: 12 (ref 12–28)
BUN: 36 mg/dL — ABNORMAL HIGH (ref 8–27)
CO2: 20 mmol/L (ref 20–29)
Calcium: 9.4 mg/dL (ref 8.7–10.3)
Chloride: 101 mmol/L (ref 96–106)
Creatinine, Ser: 3 mg/dL — ABNORMAL HIGH (ref 0.57–1.00)
Glucose: 225 mg/dL — ABNORMAL HIGH (ref 70–99)
Phosphorus: 3.7 mg/dL (ref 3.0–4.3)
Potassium: 4.3 mmol/L (ref 3.5–5.2)
Sodium: 141 mmol/L (ref 134–144)
eGFR: 16 mL/min/{1.73_m2} — ABNORMAL LOW (ref >59)

## 2022-05-11 ENCOUNTER — Inpatient Hospital Stay: Admit: 2022-05-11 | Payer: MEDICARE | Primary: Medical

## 2022-05-11 ENCOUNTER — Encounter

## 2022-05-11 DIAGNOSIS — Z1231 Encounter for screening mammogram for malignant neoplasm of breast: Secondary | ICD-10-CM

## 2022-05-12 ENCOUNTER — Telehealth: Payer: Self-pay

## 2022-05-12 NOTE — Progress Notes (Signed)
    Chronic Care Management Pharmacy Assistant   Name: Anna Sweeney  MRN: [id number] DOB: 1951/08/05  Reason for Encounter: Patient Assistance Renewal for [id number] (Trulicity)   Patient currently receives her Trulicity through the patient assistance program with [employer]. The program will be accepting all renewal applications for Medicare Patients starting 05/24/2023.  The renewal application for Trulicity will be started, and mailed to the patient's home address to complete. Once the patient's completes the application she will be instructed to return the application to Junius Argyle, CPP at [facility]. If patient has any questions regarding the application she can contact me directly @ [phone].  Application has been e-mailed to [facility] for mailing to patient's home. Medications: Outpatient Encounter Medications as of 05/12/2022  Medication Sig Note   Alcohol Swabs (DROPSAFE ALCOHOL PREP) 70 % PADS USE TO CHECK BLOOD SUGAR EVERY DAY.    amLODipine (NORVASC) 5 MG tablet TAKE 1 TABLET EVERY DAY    aspirin EC 81 MG tablet Take 81 mg by mouth daily.    betamethasone dipropionate (DIPROLENE) 0.05 % cream Apply topically 2 (two) times daily as needed.    Blood Glucose Monitoring Suppl (TRUE METRIX AIR GLUCOSE METER) w/Device KIT USE AS DIRECTED    Cholecalciferol (VITAMIN D-3) 125 MCG (5000 UT) TABS Take 5,000 Units by mouth daily.    Clobetasol Prop Emollient Base (CLOBETASOL PROPIONATE E) 0.05 % emollient cream Apply to aa's lower legs BID until clear. Then PRN thereafter. Avoid f/g/a.    Dulaglutide (TRULICITY) 3 MQ/5.9CN SOPN Inject 3 mg as directed once a week. Patient receives through [employer] through Dec 2022    glipiZIDE (GLUCOTROL XL) 2.5 MG 24 hr tablet Take 1 tablet (2.5 mg total) by mouth daily with breakfast.    lovastatin (MEVACOR) 20 MG tablet TAKE 1 TABLET BY MOUTH (20 MG TOTAL) AT BEDTIME    meclizine (ANTIVERT) 12.5 MG tablet TAKE 1 TABLET  (12.5 MG TOTAL) BY MOUTH 2 (TWO) TIMES DAILY AS NEEDED FOR DIZZINESS.    mupirocin cream (BACTROBAN) 2 % Apply 1 application topically 2 (two) times daily.    neomycin-polymyxin-hydrocortisone (CORTISPORIN) OTIC solution Place 3 drops into both ears 2 (two) times daily as needed (itching).    trandolapril (MAVIK) 4 MG tablet TAKE 1 TABLET EVERY DAY (Patient not taking: Reported on 02/08/2022)    triamcinolone cream (KENALOG) 0.5 % Apply 1 application topically 2 (two) times daily. 09/23/2020: As needed   TRUE METRIX BLOOD GLUCOSE TEST test strip TEST BLOOD SUGAR EVERY DAY    TRUEplus Lancets 33G MISC TEST BLOOD SUGAR EVERY DAY    verapamil (VERELAN PM) 240 MG 24 hr capsule TAKE 1 CAPSULE (240 MG TOTAL) BY MOUTH AT BEDTIME.    No facility-administered encounter medications on file as of 05/12/2022.   Lynann Bologna, CPA/CMA Clinical Pharmacist Assistant Phone: [phone]

## 2022-05-28 ENCOUNTER — Other Ambulatory Visit: Payer: Self-pay | Admitting: Family Medicine

## 2022-06-03 ENCOUNTER — Inpatient Hospital Stay: Admit: 2022-06-03 | Payer: MEDICARE | Primary: Medical

## 2022-06-03 ENCOUNTER — Encounter

## 2022-06-03 DIAGNOSIS — R928 Other abnormal and inconclusive findings on diagnostic imaging of breast: Secondary | ICD-10-CM

## 2022-06-04 ENCOUNTER — Encounter: Payer: Self-pay | Admitting: Urology

## 2022-06-04 ENCOUNTER — Ambulatory Visit: Payer: Medicare PPO | Admitting: Urology

## 2022-06-04 VITALS — BP 144/76 | HR 90 | Ht 67.0 in | Wt 155.0 lb

## 2022-06-04 DIAGNOSIS — N281 Cyst of kidney, acquired: Secondary | ICD-10-CM

## 2022-06-04 NOTE — Progress Notes (Signed)
06/04/2022 11:11 AM   Anna Sweeney 02-14-1951 [id number]  Referring provider: [name], MD [address] [place],  [place] [address]  Chief Complaint  Patient presents with   Other    Urologic history: 1.  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 female presents for follow-up visit  Doing well since her last visit and denies flank, abdominal or pelvic pain No dysuria or gross hematuria Renal mass protocol MRI 10/17/2020 with a stable 2.0 x 1.8 cm Bosniak 3 right renal cyst (2.0 x 1.7 on prior scan) Bilateral simple renal cyst and a 1.6 cm Bosniak 2 right renal cyst Follow-up renal ultrasound performed 05/07/2021 which did not visualize the Bosniak 3 lesion.  She had a repeat renal ultrasound 01/27/2022 by nephrology which did show a 2.6 cm complex lower pole cyst   PMH: Past Medical History:  Diagnosis Date   BRCA negative 11/15/2012   [facility]   Diabetes mellitus without complication ([place])    History of chicken pox    Hypertension     Surgical History: Past Surgical History:  Procedure Laterality Date   FOOT SURGERY      Home Medications:  Allergies as of 06/04/2022       Reactions   Pioglitazone    Dizziness        Medication List        Accurate as of June 04, 2022 11:11 AM. If you have any questions, ask your nurse or doctor.          amLODipine 5 MG tablet Commonly known as: NORVASC TAKE 1 TABLET EVERY DAY   aspirin EC 81 MG tablet Take 81 mg by mouth daily.   betamethasone dipropionate 0.05 % cream Apply topically 2 (two) times daily as needed.   Clobetasol Prop Emollient Base 0.05 % emollient cream Commonly known as: Clobetasol Propionate E Apply to aa's lower legs BID until clear. Then PRN thereafter. Avoid f/g/a.   DropSafe Alcohol Prep 70 % Pads USE TO CHECK BLOOD SUGAR EVERY DAY.   glipiZIDE 2.5 MG 24 hr tablet Commonly known as: GLUCOTROL XL TAKE 1 TABLET BY MOUTH EVERY DAY  WITH BREAKFAST   lovastatin 20 MG tablet Commonly known as: MEVACOR TAKE 1 TABLET BY MOUTH (20 MG TOTAL) AT BEDTIME   meclizine 12.5 MG tablet Commonly known as: ANTIVERT TAKE 1 TABLET (12.5 MG TOTAL) BY MOUTH 2 (TWO) TIMES DAILY AS NEEDED FOR DIZZINESS.   mupirocin cream 2 % Commonly known as: BACTROBAN Apply 1 application topically 2 (two) times daily.   neomycin-polymyxin-hydrocortisone OTIC solution Commonly known as: CORTISPORIN Place 3 drops into both ears 2 (two) times daily as needed (itching).   trandolapril 4 MG tablet Commonly known as: MAVIK TAKE 1 TABLET EVERY DAY   triamcinolone cream 0.5 % Commonly known as: KENALOG Apply 1 application topically 2 (two) times daily.   True Metrix Air Glucose Meter w/Device Kit USE AS DIRECTED   True Metrix Blood Glucose Test test strip Generic drug: glucose blood TEST BLOOD SUGAR EVERY DAY   TRUEplus Lancets 33G Misc TEST BLOOD SUGAR EVERY DAY   Trulicity 3 SL/3.7DS Sopn Generic drug: Dulaglutide Inject 3 mg as directed once a week. Patient receives through [employer] through Dec 2022   verapamil 240 MG 24 hr capsule Commonly known as: VERELAN PM TAKE 1 CAPSULE (240 MG TOTAL) BY MOUTH AT BEDTIME.   Vitamin D-3 125 MCG (5000 UT) Tabs Take 5,000 Units  by mouth daily.        Allergies:  Allergies  Allergen Reactions   Pioglitazone     Dizziness    Family History: Family History  Problem Relation Age of Onset   Breast cancer Mother    Breast cancer Sister    Cancer Sister        renal     Social History:  reports that she has quit smoking. Her smoking use included cigarettes. She has a 4.00 pack-year smoking history. She has never used smokeless tobacco. She reports that she does not drink alcohol and does not use drugs.   Physical Exam: BP (!) 144/76   Pulse 90   Ht _0  (1.702 m)   Wt 155 lb (70.3 kg)   BMI 24.28 kg/m   Constitutional:  Alert and oriented, No acute  distress. HEENT: [place] AT, moist mucus membranes.  Trachea midline, no masses. Cardiovascular: No clubbing, cyanosis, or edema. Respiratory: Normal respiratory effort, no increased work of breathing.   Assessment & Plan:    1.  Bosniak 3 right renal cyst Recent ultrasound measuring cyst at 2.6 cm Discussed there may be a discrepancy and size on ultrasound versus MRI measurements and an MRI could be repeated to document stability versus growth We also discussed percutaneous ablation by interventional radiology which she was interested in pursuing Will refer to see if this lesion would be amenable to percutaneous ablation   Anna Sons, MD  [facility] [address], [place], [place] [address] [phone]

## 2022-06-07 ENCOUNTER — Other Ambulatory Visit: Payer: Self-pay | Admitting: Urology

## 2022-06-07 DIAGNOSIS — N2889 Other specified disorders of kidney and ureter: Secondary | ICD-10-CM

## 2022-06-15 ENCOUNTER — Ambulatory Visit
Admission: RE | Admit: 2022-06-15 | Discharge: 2022-06-15 | Disposition: A | Discharge status: Home / Self Care | Payer: Medicare PPO | Source: Ambulatory Visit | Attending: Urology | Admitting: Urology

## 2022-06-15 DIAGNOSIS — N2889 Other specified disorders of kidney and ureter: Secondary | ICD-10-CM

## 2022-06-15 HISTORY — PX: IR RADIOLOGIST EVAL & MGMT: IMG5224

## 2022-06-15 NOTE — Consult Note (Signed)
Chief Complaint: Bosniak 3 Right Renal Lesion  Referring Physician(s): Stoioff,Scott C  History of Present Illness: Anna Sweeney is a 71 y.o. female with a history of Bosniak 3 right renal lesion who presents to IR clinic today to discuss possible percutaneous ablation.  She is feeling well today and denies hematuria, flank pain, and unintentional weight loss.  Review of her prior MRI's from 10/17/2020 and 12/06/2019 who a complex interpolar right renal lesion with thickened enhancing septations.  This lesion measured 2.2 x 2.0 x 2.0 cm on 10/18/2019 and 2.3 x 2.0 x 1.7 cm on 10/17/2020.  Past Medical History:  Diagnosis Date   BRCA negative 11/15/2012   [facility]   Diabetes mellitus without complication ([place])    History of chicken pox    Hypertension     Past Surgical History:  Procedure Laterality Date   FOOT SURGERY      Allergies: Pioglitazone  Medications: Prior to Admission medications   Medication Sig Start Date End Date Taking? Authorizing Provider  Alcohol Swabs (DROPSAFE ALCOHOL PREP) 70 % PADS USE TO CHECK BLOOD SUGAR EVERY DAY. 03/01/22   [name], MD  amLODipine (NORVASC) 5 MG tablet TAKE 1 TABLET EVERY DAY 03/15/22   [name], MD  aspirin EC 81 MG tablet Take 81 mg by mouth daily.    [provider]  betamethasone dipropionate (DIPROLENE) 0.05 % cream Apply topically 2 (two) times daily as needed. 01/16/19   [name], MD  Blood Glucose Monitoring Suppl (TRUE METRIX AIR GLUCOSE METER) w/Device KIT USE AS DIRECTED 09/21/21   [name], MD  Cholecalciferol (VITAMIN D-3) 125 MCG (5000 UT) TABS Take 5,000 Units by mouth daily.    [provider]  Clobetasol Prop Emollient Base (CLOBETASOL PROPIONATE E) 0.05 % emollient cream Apply to aa's lower legs BID until clear. Then PRN thereafter. Avoid f/g/a. 08/18/20   Ralene Bathe, MD  Dulaglutide (TRULICITY) 3 GT/3.6IW SOPN Inject 3 mg as directed once a week. Patient receives  through [facility] through Dec 2022 05/19/21   [name], MD  glipiZIDE (GLUCOTROL XL) 2.5 MG 24 hr tablet TAKE 1 TABLET BY MOUTH EVERY DAY WITH BREAKFAST 05/28/22   [name], MD  lovastatin (MEVACOR) 20 MG tablet TAKE 1 TABLET BY MOUTH (20 MG TOTAL) AT BEDTIME 07/07/21   [name], MD  meclizine (ANTIVERT) 12.5 MG tablet TAKE 1 TABLET (12.5 MG TOTAL) BY MOUTH 2 (TWO) TIMES DAILY AS NEEDED FOR DIZZINESS. 01/26/22   [name], MD  mupirocin cream (BACTROBAN) 2 % Apply 1 application topically 2 (two) times daily. 06/26/21   [name], MD  neomycin-polymyxin-hydrocortisone (CORTISPORIN) OTIC solution Place 3 drops into both ears 2 (two) times daily as needed (itching). 12/02/21   [name], MD  trandolapril (MAVIK) 4 MG tablet TAKE 1 TABLET EVERY DAY 07/22/21   [name], MD  triamcinolone cream (KENALOG) 0.5 % Apply 1 application topically 2 (two) times daily. 04/22/20   [name], MD  TRUE METRIX BLOOD GLUCOSE TEST test strip TEST BLOOD SUGAR EVERY DAY 03/01/22   [name], MD  TRUEplus Lancets 33G MISC TEST BLOOD SUGAR EVERY DAY 03/01/22   [name], MD  verapamil (VERELAN PM) 240 MG 24 hr capsule TAKE 1 CAPSULE (240 MG TOTAL) BY MOUTH AT BEDTIME. 11/04/21   [name], MD     Family History  Problem Relation Age of Onset  Breast cancer Mother    Breast cancer Sister    Cancer Sister        renal     Social History   Socioeconomic History   Marital status: Widowed    Spouse name: Not on file   Number of children: 2   Years of education: Not on file   Highest education level: Some college, no degree  Occupational History   Occupation: retired  Tobacco Use   Smoking status: Former    Packs/day: 1.00    Years: 4.00    Total pack years: 4.00    Types: Cigarettes   Smokeless tobacco: Never   Tobacco comments:    quit in [place] Use   Vaping Use: Never used  Substance and Sexual Activity    Alcohol use: No   Drug use: No   Sexual activity: Yes    Birth control/protection: Post-menopausal  Other Topics Concern   Not on file  Social History Narrative   Not on file   Social Determinants of Health   Financial Resource Strain: Low Risk  (09/29/2021)   Overall Financial Resource Strain (CARDIA)    Difficulty of Paying Living Expenses: Not hard at all  Food Insecurity: No Food Insecurity (09/29/2021)   Hunger Vital Sign    Worried About Running Out of Food in the Last Year: Never true    [place] in the Last Year: Never true  Transportation Needs: No Transportation Needs (09/29/2021)   PRAPARE - Hydrologist (Medical): No    Lack of Transportation (Non-Medical): No  Physical Activity: Insufficiently Active (09/29/2021)   Exercise Vital Sign    Days of Exercise per Week: 5 days    Minutes of Exercise per Session: 10 min  Stress: No Stress Concern Present (09/29/2021)   [place]    Feeling of Stress : Not at all  Social Connections: Socially Isolated (09/29/2021)   Social Connection and Isolation Panel [NHANES]    Frequency of Communication with Friends and Family: More than three times a week    Frequency of Social Gatherings with Friends and Family: More than three times a week    Attends Religious Services: Never    Marine scientist or Organizations: No    Attends Archivist Meetings: Never    Marital Status: Widowed   Review of Systems: A 12 point ROS discussed and pertinent positives are indicated in the HPI above.  All other systems are negative.  Review of Systems  Vital Signs: BP (!) 166/74 (BP Location: Left Arm, Patient Position: Sitting, Cuff Size: Normal)   Pulse 83   Temp 98.6 F (37 C) (Oral)   Resp 14   SpO2 97%   Advance Care Plan: The advanced care plan/surrogate decision maker was discussed at the time of visit and the patient did  not wish to discuss or was not able to name a surrogate decision maker or provide an advance care plan.   Physical Exam Constitutional:      Appearance: Normal appearance.  Cardiovascular:     Rate and Rhythm: Normal rate and regular rhythm.     Pulses: Normal pulses.     Heart sounds: Normal heart sounds.  Pulmonary:     Effort: Pulmonary effort is normal.     Breath sounds: Normal breath sounds.  Abdominal:     General: Abdomen is flat.     Palpations:  Abdomen is soft.  Skin:    General: Skin is warm and dry.  Neurological:     Mental Status: She is alert and oriented to person, place, and time.  Psychiatric:        Mood and Affect: Mood normal.        Behavior: Behavior normal.   Imaging: See MRI abdomen findings above.  Labs:  CBC: Recent Labs    12/02/21 1115 02/08/22 1321  WBC 7.6 6.0  HGB 12.3 11.2  HCT 36.9 34.1  PLT 226 187    COAGS: No results for input(s): "INR", "APTT" in the last 8760 hours.  BMP: Recent Labs    12/02/21 1115 02/08/22 1321 05/03/22 1034  NA 140 142 141  K 4.6 4.7 4.3  CL 100 103 101  CO2 19* 19* 20  GLUCOSE 175* 95 225*  BUN 33* 46* 36*  CALCIUM 9.9 9.7 9.4  CREATININE 2.69* 3.76* 3.00*    LIVER FUNCTION TESTS: Recent Labs    12/02/21 1115 02/08/22 1321 05/03/22 1034  BILITOT 0.2  --   --   AST 24  --   --   ALT 16  --   --   ALKPHOS 79  --   --   PROT 7.8  --   --   ALBUMIN 4.8 4.9* 4.3    TUMOR MARKERS: No results for input(s): "AFPTM", "CEA", "CA199", "CHROMGRNA" in the last 8760 hours.  Assessment and Plan:  71 year old woman with Bosniak 3 right renal lesion presents to IR clinic for discussion of percutaneous ablation.  I discussed the risks and benefits of both microwave and cryoablation with Anna Sweeney.    I do not think this lesion is appropriate for percutaneous ablation at this time given its anterior location and its proximity to the duodenum.  Furthermore, since the lesion has not changed in size  between sequential MRI's, the risks of injury to the kidney and bowel outweigh the benefits.    I would recommend a repeat contrast enhanced abdominal MRI.  If stable, follow up MRI's should be performed every 6-12 months.  I explained my recommendations to Anna Sweeney and answered all her question.  Thank you for this interesting consult.  I greatly enjoyed meeting Anna Sweeney and look forward to participating in their care.  A copy of this report was sent to the requesting provider on this date.  Electronically Signed: Paula Libra Breezie Micucci 06/15/2022, 2:56 PM   I spent a total of 40 Minutes in face to face in clinical consultation, greater than 50% of which was counseling/coordinating care for Bosniak 3 right renal mass.

## 2022-06-16 ENCOUNTER — Encounter: Payer: Self-pay | Admitting: Urology

## 2022-06-17 ENCOUNTER — Encounter

## 2022-06-18 ENCOUNTER — Telehealth: Payer: Self-pay | Admitting: Urology

## 2022-06-18 NOTE — Telephone Encounter (Signed)
Was seen by IR and Bosniak 3 cyst not amenable to percutaneous treatment.  Continued surveillance was recommended by IR.  Contact patient to discuss further and left voicemail to call back

## 2022-06-22 NOTE — Telephone Encounter (Signed)
She desires to continue surveillance and will schedule follow-up office visit/MRI in 6 months

## 2022-07-13 ENCOUNTER — Other Ambulatory Visit: Payer: Self-pay | Admitting: Urology

## 2022-07-13 DIAGNOSIS — N2889 Other specified disorders of kidney and ureter: Secondary | ICD-10-CM

## 2022-07-13 DIAGNOSIS — N281 Cyst of kidney, acquired: Secondary | ICD-10-CM

## 2022-07-14 DIAGNOSIS — E7800 Pure hypercholesterolemia, unspecified: Secondary | ICD-10-CM | POA: Diagnosis not present

## 2022-07-19 ENCOUNTER — Other Ambulatory Visit: Payer: Self-pay | Admitting: Family Medicine

## 2022-07-20 NOTE — Telephone Encounter (Signed)
Refilled 05/28/2022 #[id number] rf. Requested Prescriptions  Pending Prescriptions Disp Refills   glipiZIDE (GLUCOTROL XL) 2.5 MG 24 hr tablet [Pharmacy Med Name: GLIPIZIDE ER 2.5 MG Tablet Extended Release 24 Hour] 90 tablet 3    Sig: TAKE 1 TABLET EVERY DAY WITH BREAKFAST     Endocrinology:  Diabetes - Sulfonylureas Failed - 07/19/2022 11:16 AM      Failed - Cr in normal range and within 360 days    Creatinine, Ser  Date Value Ref Range Status  05/03/2022 3.00 (H) 0.57 - 1.00 mg/dL Final   Creatinine, POC  Date Value Ref Range Status  07/17/2015 N/A mg/dL Final         Passed - HBA1C is between 0 and 7.9 and within 180 days    Hemoglobin A1C  Date Value Ref Range Status  02/08/2022 6.7 (A) 4.0 - 5.6 % Final   Hgb A1c MFr Bld  Date Value Ref Range Status  12/02/2021 7.3 (H) 4.8 - 5.6 % Final    Comment:             Prediabetes: 5.7 - 6.4          Diabetes: >6.4          Glycemic control for adults with diabetes: <7.0          Passed - Valid encounter within last 6 months    Recent Outpatient Visits           5 months ago Type 2 diabetes mellitus with stage 3b chronic kidney disease, without long-term current use of insulin [facility])   [facility] Malva Limes, MD   6 months ago Dizziness   [facility] Malva Limes, MD   6 months ago Dizziness   [facility] [place], [place], PA-C   7 months ago Annual physical exam   [facility] Malva Limes, MD   1 year ago Primary hypertension   [facility] Malva Limes, MD       Future Appointments             In 1 month Fisher, Demetrios Isaacs, MD [facility]

## 2022-08-04 ENCOUNTER — Other Ambulatory Visit: Payer: Self-pay | Admitting: Family Medicine

## 2022-09-06 ENCOUNTER — Encounter: Payer: Self-pay | Admitting: Family Medicine

## 2022-09-06 ENCOUNTER — Ambulatory Visit (INDEPENDENT_AMBULATORY_CARE_PROVIDER_SITE_OTHER): Payer: Medicare PPO | Admitting: Family Medicine

## 2022-09-06 VITALS — BP 139/59 | HR 86 | Temp 97.6°F | Ht 67.0 in | Wt 160.0 lb

## 2022-09-06 DIAGNOSIS — Z23 Encounter for immunization: Secondary | ICD-10-CM | POA: Diagnosis not present

## 2022-09-06 DIAGNOSIS — E2839 Other primary ovarian failure: Secondary | ICD-10-CM

## 2022-09-06 DIAGNOSIS — N184 Chronic kidney disease, stage 4 (severe): Secondary | ICD-10-CM

## 2022-09-06 DIAGNOSIS — E1122 Type 2 diabetes mellitus with diabetic chronic kidney disease: Secondary | ICD-10-CM

## 2022-09-06 DIAGNOSIS — E7800 Pure hypercholesterolemia, unspecified: Secondary | ICD-10-CM | POA: Diagnosis not present

## 2022-09-06 DIAGNOSIS — I10 Essential (primary) hypertension: Secondary | ICD-10-CM

## 2022-09-06 LAB — POCT GLYCOSYLATED HEMOGLOBIN (HGB A1C)
Est. average glucose Bld gHb Est-mCnc: 171
Hemoglobin A1C: 7.6 % — AB (ref 4.0–5.6)

## 2022-09-06 MED ORDER — LOVASTATIN 20 MG PO TABS: ORAL_TABLET | ORAL | 4 refills | Status: DC

## 2022-09-06 NOTE — Progress Notes (Signed)
Established patient visit   Patient: Anna Sweeney   DOB: 14-May-1951   72 y.o. Female  MRN: [id number] Visit Date: 09/06/2022  Today's healthcare provider: Lelon Huh, MD   Chief Complaint  Patient presents with   Diabetes   Subjective    Diabetes Pertinent negatives for hypoglycemia include no dizziness. Pertinent negatives for diabetes include no chest pain, no fatigue and no weakness.   She is doing well with current medications, but has not been checking her sugars as recommended. Having no hypoglycemia symptoms. Does admit to slipping on diabetic diet over the holidays. Not exercising regularly.   Medications: Outpatient Medications Prior to Visit  Medication Sig   Alcohol Swabs (DROPSAFE ALCOHOL PREP) 70 % PADS USE TO CHECK BLOOD SUGAR EVERY DAY.   amLODipine (NORVASC) 5 MG tablet TAKE 1 TABLET EVERY DAY   aspirin EC 81 MG tablet Take 81 mg by mouth daily.   betamethasone dipropionate (DIPROLENE) 0.05 % cream Apply topically 2 (two) times daily as needed.   Blood Glucose Monitoring Suppl (TRUE METRIX AIR GLUCOSE METER) w/Device KIT USE AS DIRECTED   Cholecalciferol (VITAMIN D-3) 125 MCG (5000 UT) TABS Take 5,000 Units by mouth daily.   Clobetasol Prop Emollient Base (CLOBETASOL PROPIONATE E) 0.05 % emollient cream Apply to aa's lower legs BID until clear. Then PRN thereafter. Avoid f/g/a.   Dulaglutide (TRULICITY) 3 OZ/3.0QM SOPN Inject 3 mg as directed once a week. Patient receives through [employer] through Dec 2022   glipiZIDE (GLUCOTROL XL) 2.5 MG 24 hr tablet TAKE 1 TABLET EVERY DAY WITH BREAKFAST   lovastatin (MEVACOR) 20 MG tablet TAKE 1 TABLET BY MOUTH (20 MG TOTAL) AT BEDTIME   meclizine (ANTIVERT) 12.5 MG tablet TAKE 1 TABLET (12.5 MG TOTAL) BY MOUTH 2 (TWO) TIMES DAILY AS NEEDED FOR DIZZINESS.   mupirocin cream (BACTROBAN) 2 % Apply 1 application topically 2 (two) times daily.   neomycin-polymyxin-hydrocortisone (CORTISPORIN) OTIC solution Place  3 drops into both ears 2 (two) times daily as needed (itching).   trandolapril (MAVIK) 4 MG tablet TAKE 1 TABLET EVERY DAY   triamcinolone cream (KENALOG) 0.5 % Apply 1 application topically 2 (two) times daily.   TRUE METRIX BLOOD GLUCOSE TEST test strip TEST BLOOD SUGAR EVERY DAY   TRUEplus Lancets 33G MISC TEST BLOOD SUGAR EVERY DAY   verapamil (VERELAN PM) 240 MG 24 hr capsule TAKE 1 CAPSULE (240 MG TOTAL) BY MOUTH AT BEDTIME.   No facility-administered medications prior to visit.    Review of Systems  Constitutional:  Negative for appetite change, chills, fatigue and fever.  Respiratory:  Negative for chest tightness and shortness of breath.   Cardiovascular:  Negative for chest pain and palpitations.  Gastrointestinal:  Negative for abdominal pain, nausea and vomiting.  Neurological:  Negative for dizziness and weakness.      Objective    BP (!) 139/59 (BP Location: Right Arm, Patient Position: Sitting, Cuff Size: Large)   Pulse 86   Temp 97.6 F (36.4 C)   Ht 5\' 7"  (1.702 m)   Wt 160 lb (72.6 kg)   SpO2 98%   BMI 25.06 kg/m   Physical Exam    General: Appearance:    Well developed, well nourished female in no acute distress  Eyes:    PERRL, conjunctiva/corneas clear, EOM's intact       Lungs:     Clear to auscultation bilaterally, respirations unlabored  Heart:    Normal heart rate. Normal rhythm.  No murmurs, rubs, or gallops.    MS:   All extremities are intact.    Neurologic:   Awake, alert, oriented x 3. No apparent focal neurological defect.         Results for orders placed or performed in visit on 09/06/22  POCT HgB A1C  Result Value Ref Range   Hemoglobin A1C 7.6 (A) 4.0 - 5.6 %   Est. average glucose Bld gHb Est-mCnc 171     Assessment & Plan     1. Type 2 diabetes mellitus with stage 4 chronic kidney disease, without long-term current use of insulin (HCC) A1c is up due to holiday indulgences. Discussed possibly increasing Trulicity if F7T does not  come back down. She gets Trulicity through patient assistance and current has stockpile that will last several months.   2. Primary hypertension BP near goal. Continue current medications.    3. High cholesterol She is tolerating lovastatin well with no adverse effects.  refill lovastatin (MEVACOR) 20 MG tablet; TAKE 1 TABLET BY MOUTH (20 MG TOTAL) EVERY EVENING  Dispense: 90 tablet; Refill: 4  4. Estrogen deficiency Due for  DG Bone density [place]; Future  5. Need for influenza vaccination  - Flu Vaccine QUAD High Dose IM (Fluad)      The entirety of the information documented in the History of Present Illness, Review of Systems and Physical Exam were personally obtained by me. Portions of this information were initially documented by the CMA and reviewed by me for thoroughness and accuracy.     Lelon Huh, MD  [place] [phone] (phone) [phone] (fax)  [place]

## 2022-09-07 ENCOUNTER — Telehealth: Payer: Self-pay

## 2022-09-07 NOTE — Telephone Encounter (Signed)
Copied from [place] [id number]. Topic: General - Inquiry >> Sep 07, 2022  1:58 PM Penni Bombard wrote: Reason for CRM: Pt called asking about paperwork that [facility] faxed over to Dr Caryn Section regarding an okay on her Trulicity medication.  They told her they sent it today and need it to be signed in order for her to get the medication,    CB#  [phone]

## 2022-10-05 ENCOUNTER — Ambulatory Visit (INDEPENDENT_AMBULATORY_CARE_PROVIDER_SITE_OTHER): Payer: No Typology Code available for payment source

## 2022-10-05 VITALS — BP 122/72 | Ht 67.0 in | Wt 160.0 lb

## 2022-10-05 DIAGNOSIS — Z0000 Encounter for general adult medical examination without abnormal findings: Secondary | ICD-10-CM | POA: Diagnosis not present

## 2022-10-05 NOTE — Patient Instructions (Signed)
Anna Sweeney , Thank you for taking time to come for your Medicare Wellness Visit. I appreciate your ongoing commitment to your health goals. Please review the following plan we discussed and let me know if I can assist you in the future.   These are the goals we discussed:  Goals      DIET - EAT MORE FRUITS AND VEGETABLES     DIET - INCREASE WATER INTAKE     Recommend increasing water intake to at least 3 glasses every day.      Monitor and Manage My Blood Sugar-Diabetes Type 2     Timeframe:  Long-Range Goal Priority:  High Start Date: 05/19/2021                            Expected End Date: 05/19/2022                      Follow Up within 90 days   -check blood sugar daily before breakfast - check blood sugar if I feel it is too high or too low - enter blood sugar readings and medication or insulin into daily log - take the blood sugar log to all doctor visits    Why is this important?   Checking your blood sugar at home helps to keep it from getting very high or very low.  Writing the results in a diary or log helps the doctor know how to care for you.  Your blood sugar log should have the time, date and the results.  Also, write down the amount of insulin or other medicine that you take.  Other information, like what you ate, exercise done and how you were feeling, will also be helpful.     Notes:      Prevent falls     Recommend to remove any items from the home that may cause slips or trips.        This is a list of the screening recommended for you and due dates:  Health Maintenance  Topic Date Due   COVID-19 Vaccine (1) Never done   Zoster (Shingles) Vaccine (1 of 2) Never done   Eye exam for diabetics  05/21/2021   DEXA scan (bone density measurement)  08/11/2022   Yearly kidney health urinalysis for diabetes  12/03/2022   Hemoglobin A1C  03/07/2023   Yearly kidney function blood test for diabetes  05/04/2023   DTaP/Tdap/Td vaccine (2 - Td or Tdap) 07/11/2023    Medicare Annual Wellness Visit  10/06/2023   Cologuard (Stool DNA test)  10/29/2023   Mammogram  11/26/2023   Pneumonia Vaccine  Completed   Flu Shot  Completed   Hepatitis C Screening: USPSTF Recommendation to screen - Ages 18-79 yo.  Completed   HPV Vaccine  Aged Out    Advanced directives: [id number] given  Conditions/risks identified: none  Next appointment: Follow up in one year for your annual wellness visit 10/11/2023 '@2'$ :30pm in person   Preventive Care 65 Years and Older, Female Preventive care refers to lifestyle choices and visits with your health care provider that can promote health and wellness. What does preventive care include? A yearly physical exam. This is also called an annual well check. Dental exams once or twice a year. Routine eye exams. Ask your health care provider how often you should have your eyes checked. Personal lifestyle choices, including: Daily care of your teeth and gums. Regular physical activity.  Eating a healthy diet. Avoiding tobacco and drug use. Limiting alcohol use. Practicing safe sex. Taking low-dose aspirin every day. Taking vitamin and mineral supplements as recommended by your health care provider. What happens during an annual well check? The services and screenings done by your health care provider during your annual well check will depend on your age, overall health, lifestyle risk factors, and family history of disease. Counseling  Your health care provider may ask you questions about your: Alcohol use. Tobacco use. Drug use. Emotional well-being. Home and relationship well-being. Sexual activity. Eating habits. History of falls. Memory and ability to understand (cognition). Work and work Statistician. Reproductive health. Screening  You may have the following tests or measurements: Height, weight, and BMI. Blood pressure. Lipid and cholesterol levels. These may be checked every 5 years, or more frequently if you  are over 31 years old. Skin check. Lung cancer screening. You may have this screening every year starting at age 89 if you have a 30-pack-year history of smoking and currently smoke or have quit within the past 15 years. Fecal occult blood test (FOBT) of the stool. You may have this test every year starting at age 28. Flexible sigmoidoscopy or colonoscopy. You may have a sigmoidoscopy every 5 years or a colonoscopy every 10 years starting at age 10. Hepatitis C blood test. Hepatitis B blood test. Sexually transmitted disease (STD) testing. Diabetes screening. This is done by checking your blood sugar (glucose) after you have not eaten for a while (fasting). You may have this done every 1-3 years. Bone density scan. This is done to screen for osteoporosis. You may have this done starting at age 84. Mammogram. This may be done every 1-2 years. Talk to your health care provider about how often you should have regular mammograms. Talk with your health care provider about your test results, treatment options, and if necessary, the need for more tests. Vaccines  Your health care provider may recommend certain vaccines, such as: Influenza vaccine. This is recommended every year. Tetanus, diphtheria, and acellular pertussis (Tdap, Td) vaccine. You may need a Td booster every 10 years. Zoster vaccine. You may need this after age 57. Pneumococcal 13-valent conjugate (PCV13) vaccine. One dose is recommended after age 71. Pneumococcal polysaccharide (PPSV23) vaccine. One dose is recommended after age 71. Talk to your health care provider about which screenings and vaccines you need and how often you need them. This information is not intended to replace advice given to you by your health care provider. Make sure you discuss any questions you have with your health care provider. Document Released: 08/22/2015 Document Revised: 04/14/2016 Document Reviewed: 05/27/2015 Elsevier Interactive Patient Education   2017 [place] Prevention in the Home Falls can cause injuries. They can happen to people of all ages. There are many things you can do to make your home safe and to help prevent falls. What can I do on the outside of my home? Regularly fix the edges of walkways and driveways and fix any cracks. Remove anything that might make you trip as you walk through a door, such as a raised step or threshold. Trim any bushes or trees on the path to your home. Use bright outdoor lighting. Clear any walking paths of anything that might make someone trip, such as rocks or tools. Regularly check to see if handrails are loose or broken. Make sure that both sides of any steps have handrails. Any raised decks and porches should have guardrails on the  edges. Have any leaves, snow, or ice cleared regularly. Use sand or salt on walking paths during winter. Clean up any spills in your garage right away. This includes oil or grease spills. What can I do in the bathroom? Use night lights. Install grab bars by the toilet and in the tub and shower. Do not use towel bars as grab bars. Use non-skid mats or decals in the tub or shower. If you need to sit down in the shower, use a plastic, non-slip stool. Keep the floor dry. Clean up any water that spills on the floor as soon as it happens. Remove soap buildup in the tub or shower regularly. Attach bath mats securely with double-sided non-slip rug tape. Do not have throw rugs and other things on the floor that can make you trip. What can I do in the bedroom? Use night lights. Make sure that you have a light by your bed that is easy to reach. Do not use any sheets or blankets that are too big for your bed. They should not hang down onto the floor. Have a firm chair that has side arms. You can use this for support while you get dressed. Do not have throw rugs and other things on the floor that can make you trip. What can I do in the kitchen? Clean up any  spills right away. Avoid walking on wet floors. Keep items that you use a lot in easy-to-reach places. If you need to reach something above you, use a strong step stool that has a grab bar. Keep electrical cords out of the way. Do not use floor polish or wax that makes floors slippery. If you must use wax, use non-skid floor wax. Do not have throw rugs and other things on the floor that can make you trip. What can I do with my stairs? Do not leave any items on the stairs. Make sure that there are handrails on both sides of the stairs and use them. Fix handrails that are broken or loose. Make sure that handrails are as long as the stairways. Check any carpeting to make sure that it is firmly attached to the stairs. Fix any carpet that is loose or worn. Avoid having throw rugs at the top or bottom of the stairs. If you do have throw rugs, attach them to the floor with carpet tape. Make sure that you have a light switch at the top of the stairs and the bottom of the stairs. If you do not have them, ask someone to add them for you. What else can I do to help prevent falls? Wear shoes that: Do not have high heels. Have rubber bottoms. Are comfortable and fit you well. Are closed at the toe. Do not wear sandals. If you use a stepladder: Make sure that it is fully opened. Do not climb a closed stepladder. Make sure that both sides of the stepladder are locked into place. Ask someone to hold it for you, if possible. Clearly mark and make sure that you can see: Any grab bars or handrails. First and last steps. Where the edge of each step is. Use tools that help you move around (mobility aids) if they are needed. These include: Canes. Walkers. Scooters. Crutches. Turn on the lights when you go into a dark area. Replace any light bulbs as soon as they burn out. Set up your furniture so you have a clear path. Avoid moving your furniture around. If any of your floors are uneven, fix  them. If  there are any pets around you, be aware of where they are. Review your medicines with your doctor. Some medicines can make you feel dizzy. This can increase your chance of falling. Ask your doctor what other things that you can do to help prevent falls. This information is not intended to replace advice given to you by your health care provider. Make sure you discuss any questions you have with your health care provider. Document Released: 05/22/2009 Document Revised: 01/01/2016 Document Reviewed: 08/30/2014 Elsevier Interactive Patient Education  2017 [employer].

## 2022-10-05 NOTE — Progress Notes (Signed)
Subjective:   Anna Sweeney is a 72 y.o. female who presents for Medicare Annual (Subsequent) preventive examination.  Review of Systems    Cardiac Risk Factors include: advanced age (>60mn, >>64women);diabetes mellitus;dyslipidemia    Objective:    Today's Vitals   10/05/22 1442  Weight: 160 lb (72.6 kg)  Height: '5\' 7"'$  (1.702 m)   Body mass index is 25.06 kg/m.     10/05/2022    2:54 PM 09/29/2021   10:32 AM 09/23/2020   10:28 AM 07/17/2018    1:37 PM 08/18/2017   12:44 PM  Advanced Directives  Does Patient Have a Medical Advance Directive? No No No No No  Does patient want to make changes to medical advance directive? Yes (ED - Information included in AVS)      Would patient like information on creating a medical advance directive? Yes (ED - Information included in AVS) No - Patient declined No - Patient declined No - Patient declined     Current Medications (verified) Outpatient Encounter Medications as of 10/05/2022  Medication Sig   Alcohol Swabs (DROPSAFE ALCOHOL PREP) 70 % PADS USE TO CHECK BLOOD SUGAR EVERY DAY.   amLODipine (NORVASC) 5 MG tablet TAKE 1 TABLET EVERY DAY   aspirin EC 81 MG tablet Take 81 mg by mouth daily.   betamethasone dipropionate (DIPROLENE) 0.05 % cream Apply topically 2 (two) times daily as needed.   Blood Glucose Monitoring Suppl (TRUE METRIX AIR GLUCOSE METER) w/Device KIT USE AS DIRECTED   Cholecalciferol (VITAMIN D-3) 125 MCG (5000 UT) TABS Take 5,000 Units by mouth daily.   Clobetasol Prop Emollient Base (CLOBETASOL PROPIONATE E) 0.05 % emollient cream Apply to aa's lower legs BID until clear. Then PRN thereafter. Avoid f/g/a.   Dulaglutide (TRULICITY) 3 M0000000SOPN Inject 3 mg as directed once a week. [facility]   glipiZIDE (GLUCOTROL XL) 2.5 MG 24 hr tablet TAKE 1 TABLET EVERY DAY WITH BREAKFAST   lovastatin (MEVACOR) 20 MG tablet TAKE 1 TABLET BY MOUTH (20 MG TOTAL) EVERY EVENING    meclizine (ANTIVERT) 12.5 MG tablet TAKE 1 TABLET (12.5 MG TOTAL) BY MOUTH 2 (TWO) TIMES DAILY AS NEEDED FOR DIZZINESS.   mupirocin cream (BACTROBAN) 2 % Apply 1 application topically 2 (two) times daily.   neomycin-polymyxin-hydrocortisone (CORTISPORIN) OTIC solution Place 3 drops into both ears 2 (two) times daily as needed (itching).   trandolapril (MAVIK) 4 MG tablet TAKE 1 TABLET EVERY DAY   triamcinolone cream (KENALOG) 0.5 % Apply 1 application topically 2 (two) times daily.   TRUE METRIX BLOOD GLUCOSE TEST test strip TEST BLOOD SUGAR EVERY DAY   TRUEplus Lancets 33G MISC TEST BLOOD SUGAR EVERY DAY   verapamil (VERELAN PM) 240 MG 24 hr capsule TAKE 1 CAPSULE (240 MG TOTAL) BY MOUTH AT BEDTIME.   No facility-administered encounter medications on file as of 10/05/2022.    Allergies (verified) Pioglitazone   History: Past Medical History:  Diagnosis Date   BRCA negative 11/15/2012   [facility]   Diabetes mellitus without complication ([place]    History of chicken pox    Hypertension    Past Surgical History:  Procedure Laterality Date   FOOT SURGERY     IR RADIOLOGIST EVAL & MGMT  06/15/2022   Family History  Problem Relation Age of Onset   Breast cancer Mother    Breast cancer Sister    Cancer Sister        renal  Social History   Socioeconomic History   Marital status: Widowed    Spouse name: Not on file   Number of children: 2   Years of education: Not on file   Highest education level: Some college, no degree  Occupational History   Occupation: retired  Tobacco Use   Smoking status: Former    Packs/day: 1.00    Years: 4.00    Total pack years: 4.00    Types: Cigarettes   Smokeless tobacco: Never   Tobacco comments:    quit in [place] Use   Vaping Use: Never used  Substance and Sexual Activity   Alcohol use: No   Drug use: No   Sexual activity: Yes    Birth control/protection: Post-menopausal  Other Topics Concern   Not on file  Social History  Narrative   Not on file   Social Determinants of Health   Financial Resource Strain: Low Risk  (10/05/2022)   Overall Financial Resource Strain (CARDIA)    Difficulty of Paying Living Expenses: Not hard at all  Food Insecurity: No Food Insecurity (10/05/2022)   Hunger Vital Sign    Worried About Running Out of Food in the Last Year: Never true    [place] in the Last Year: Never true  Transportation Needs: No Transportation Needs (10/05/2022)   PRAPARE - Hydrologist (Medical): No    Lack of Transportation (Non-Medical): No  Physical Activity: Inactive (10/05/2022)   Exercise Vital Sign    Days of Exercise per Week: 0 days    Minutes of Exercise per Session: 0 min  Stress: No Stress Concern Present (10/05/2022)   [place]    Feeling of Stress : Not at all  Social Connections: Socially Isolated (10/05/2022)   Social Connection and Isolation Panel [NHANES]    Frequency of Communication with Friends and Family: More than three times a week    Frequency of Social Gatherings with Friends and Family: Three times a week    Attends Religious Services: Never    Active Member of Clubs or Organizations: No    Attends Archivist Meetings: Never    Marital Status: Widowed    Tobacco Counseling Counseling given: Not Answered Tobacco comments: quit in 1991   Clinical Intake:  Pre-visit preparation completed: Yes  Pain : No/denies pain     BMI - recorded: 25.06 Nutritional Status: BMI 25 -29 Overweight Nutritional Risks: None Diabetes: Yes CBG done?: No Did pt. bring in CBG monitor from home?: No  How often do you need to have someone help you when you read instructions, pamphlets, or other written materials from your doctor or pharmacy?: 1 - Never  Diabetic?yes  Interpreter Needed?: No  Information entered by :: B.Brigett Estell,LPN   Activities of Daily Living     10/05/2022    2:58 PM 12/02/2021   10:40 AM  In your present state of health, do you have any difficulty performing the following activities:  Hearing? 0 0  Vision? 0 0  Difficulty concentrating or making decisions? 0 0  Walking or climbing stairs? 0 0  Dressing or bathing? 0 0  Doing errands, shopping? 0 0  Preparing Food and eating ? N   Using the Toilet? N   In the past six months, have you accidently leaked urine? N   Do you have problems with loss of bowel control? N   Managing your Medications? N  Managing your Finances? N   Housekeeping or managing your Housekeeping? N     Patient Care Team: [name], MD as PCP - General (Family Medicine) Garth Bigness as Referring Physician (Chiropractic Medicine) [facility], [place] Stoioff, Ronda Fairly, MD (Urology) Germaine Pomfret, [facility] (Pharmacist) Murlean Iba, MD (Nephrology)  Indicate any recent Medical Services you may have received from other than [facility] providers in the past year (date may be approximate).     Assessment:   This is a routine wellness examination for Anna Sweeney.  Hearing/Vision screen Hearing Screening - Comments:: Adequate hearing Vision Screening - Comments:: Adequate vision w/glasses [facility]  Dietary issues and exercise activities discussed: Current Exercise Habits: The patient does not participate in regular exercise at present, Exercise limited by: None identified   Goals Addressed             This Visit's Progress    DIET - EAT MORE FRUITS AND VEGETABLES   On track    DIET - INCREASE WATER INTAKE   On track    Recommend increasing water intake to at least 3 glasses every day.      Monitor and Manage My Blood Sugar-Diabetes Type 2   On track    Timeframe:  Long-Range Goal Priority:  High Start Date: 05/19/2021                            Expected End Date: 05/19/2022                      Follow Up within 90 days   -check blood sugar daily before breakfast -  check blood sugar if I feel it is too high or too low - enter blood sugar readings and medication or insulin into daily log - take the blood sugar log to all doctor visits    Why is this important?   Checking your blood sugar at home helps to keep it from getting very high or very low.  Writing the results in a diary or log helps the doctor know how to care for you.  Your blood sugar log should have the time, date and the results.  Also, write down the amount of insulin or other medicine that you take.  Other information, like what you ate, exercise done and how you were feeling, will also be helpful.     Notes:      Prevent falls   On track    Recommend to remove any items from the home that may cause slips or trips.       Depression Screen    10/05/2022    2:51 PM 12/02/2021   10:40 AM 09/29/2021   10:30 AM 06/23/2021   10:36 AM 10/20/2020    8:55 AM 09/23/2020   10:24 AM 09/18/2019    2:02 PM  PHQ 2/9 Scores  PHQ - 2 Score 0 0 0 0 0 0 0  PHQ- 9 Score  0  0 0      Fall Risk    10/05/2022    2:46 PM 12/02/2021   10:41 AM 09/29/2021   10:32 AM 06/23/2021   10:36 AM 10/20/2020    8:55 AM  Fall Risk   Falls in the past year? 0 0 '1 1 1  '$ Number falls in past yr: 0 0 0 0 0  Injury with Fall? 0 0 0 0 0  Risk  for fall due to : No Fall Risks No Fall Risks History of fall(s) No Fall Risks   Follow up Education provided;Falls prevention discussed Falls evaluation completed Falls prevention discussed Falls evaluation completed     FALL RISK PREVENTION PERTAINING TO THE HOME:  Any stairs in or around the home? Yes  If so, are there any without handrails? Yes  Home free of loose throw rugs in walkways, pet beds, electrical cords, etc? Yes  Adequate lighting in your home to reduce risk of falls? Yes   ASSISTIVE DEVICES UTILIZED TO PREVENT FALLS:  Life alert? No  Use of a cane, walker or w/c? No  Grab bars in the bathroom? No  Shower chair or bench in shower? No  Elevated toilet  seat or a handicapped toilet? No   TIMED UP AND GO:  Was the test performed? Yes .  Length of time to ambulate 10 feet: 10 sec.   Gait steady and fast without use of assistive device  Cognitive Function:        10/05/2022    3:02 PM  6CIT Screen  What Year? 0 points  What month? 0 points  What time? 0 points  Count back from 20 0 points  Months in reverse 0 points  Repeat phrase 0 points  Total Score 0 points    Immunizations Immunization History  Administered Date(s) Administered   Fluad Quad(high Dose 65+) 06/15/2019, 05/01/2020, 06/23/2021, 09/06/2022   Influenza, High Dose Seasonal PF 04/19/2018   Influenza,inj,Quad PF,6+ Mos 07/17/2015, 07/09/2016   Pneumococcal Conjugate-13 11/16/2016   Pneumococcal Polysaccharide-23 09/09/2011, 07/17/2018   Tdap 07/10/2013    TDAP status: Up to date  Flu Vaccine status: Up to date  Pneumococcal vaccine status: Up to date  Covid-19 vaccine status: Declined, Education has been provided regarding the importance of this vaccine but patient still declined. Advised may receive this vaccine at local pharmacy or Health Dept.or vaccine clinic. Aware to provide a copy of the vaccination record if obtained from local pharmacy or Health Dept. Verbalized acceptance and understanding.  Qualifies for Shingles Vaccine? Yes   Zostavax completed No   Shingrix Completed?: No.    Education has been provided regarding the importance of this vaccine. Patient has been advised to call insurance company to determine out of pocket expense if they have not yet received this vaccine. Advised may also receive vaccine at local pharmacy or Health Dept. Verbalized acceptance and understanding.  Screening Tests Health Maintenance  Topic Date Due   COVID-19 Vaccine (1) Never done   Zoster Vaccines- Shingrix (1 of 2) Never done   OPHTHALMOLOGY EXAM  05/21/2021   DEXA SCAN  08/11/2022   Diabetic kidney evaluation - Urine ACR  12/03/2022   HEMOGLOBIN A1C   03/07/2023   Diabetic kidney evaluation - eGFR measurement  05/04/2023   DTaP/Tdap/Td (2 - Td or Tdap) 07/11/2023   Medicare Annual Wellness (AWV)  10/06/2023   Fecal DNA (Cologuard)  10/29/2023   MAMMOGRAM  11/26/2023   Pneumonia Vaccine 39+ Years old  Completed   INFLUENZA VACCINE  Completed   Hepatitis C Screening  Completed   HPV VACCINES  Aged Out    Health Maintenance  Health Maintenance Due  Topic Date Due   COVID-19 Vaccine (1) Never done   Zoster Vaccines- Shingrix (1 of 2) Never done   OPHTHALMOLOGY EXAM  05/21/2021   DEXA SCAN  08/11/2022    Colorectal cancer screening: Type of screening: Cologuard. Completed yes. Repeat every 3 years  Mammogram status: Ordered yes. Pt provided with contact info and advised to call to schedule appt.   Bone Density status: Completed yes. Results reflect: Bone density results: NORMAL. Repeat every 5 years. Pt had done a couple years ago 2021  Lung Cancer Screening: (Low Dose CT Chest recommended if Age 69-80 years, 30 pack-year currently smoking OR have quit w/in 15years.) does not qualify.   Lung Cancer Screening Referral: no  Additional Screening:  Hepatitis C Screening: does not qualify; Completed no  Vision Screening: Recommended annual ophthalmology exams for early detection of glaucoma and other disorders of the eye. Is the patient up to date with their annual eye exam?  Yes  Who is the provider or what is the name of the office in which the patient attends annual eye exams? [place] If pt is not established with a provider, would they like to be referred to a provider to establish care? No .   Dental Screening: Recommended annual dental exams for proper oral hygiene  Community Resource Referral / Chronic Care Management: CRR required this visit?  No   CCM required this visit?  No      Plan:     I have personally reviewed and noted the following in the patient's chart:   Medical and social history Use of alcohol,  tobacco or illicit drugs  Current medications and supplements including opioid prescriptions. Patient is not currently taking opioid prescriptions. Functional ability and status Nutritional status Physical activity Advanced directives List of other physicians Hospitalizations, surgeries, and ER visits in previous 12 months Vitals Screenings to include cognitive, depression, and falls Referrals and appointments  In addition, I have reviewed and discussed with patient certain preventive protocols, quality metrics, and best practice recommendations. A written personalized care plan for preventive services as well as general preventive health recommendations were provided to patient.     Roger Shelter, LPN   [id number]   Nurse Notes: pt is doing well. Does express her need to be more active but relays she gets foot pain with consistent walking. Pt states she is going to try seat Yoga.

## 2022-10-07 NOTE — Telephone Encounter (Signed)
Anna Sweeney from [employer] , called in , checking status of paperwork for patient assistance reenrollment  for Trulicity

## 2022-10-08 NOTE — Telephone Encounter (Signed)
[place] Patient Assistance form for Trulicity was faxed for review on 06/21/22. I will have my team check in with [facility] and update the patient.   Junius Argyle, PharmD, [place], CPP  Clinical Pharmacist Practitioner  [facility] [phone]

## 2022-10-08 NOTE — Telephone Encounter (Signed)
Have you seen anything on this?  

## 2022-10-12 ENCOUNTER — Ambulatory Visit
Admission: RE | Admit: 2022-10-12 | Discharge: 2022-10-12 | Disposition: A | Discharge status: Home / Self Care | Payer: No Typology Code available for payment source | Source: Ambulatory Visit | Attending: Urology | Admitting: Urology

## 2022-10-12 DIAGNOSIS — N281 Cyst of kidney, acquired: Secondary | ICD-10-CM | POA: Diagnosis not present

## 2022-10-12 DIAGNOSIS — N2889 Other specified disorders of kidney and ureter: Secondary | ICD-10-CM

## 2022-10-12 DIAGNOSIS — K5730 Diverticulosis of large intestine without perforation or abscess without bleeding: Secondary | ICD-10-CM | POA: Diagnosis not present

## 2022-10-12 MED ORDER — GADOBUTROL 1 MMOL/ML IV SOLN
7.0000 mL | Freq: Once | INTRAVENOUS | Status: AC | PRN
  Administered 2022-10-12: 7.5 mL via INTRAVENOUS

## 2022-10-18 ENCOUNTER — Telehealth: Payer: Self-pay | Admitting: Family Medicine

## 2022-10-18 NOTE — Telephone Encounter (Signed)
Pt is calling to speak to [facility] regarding her medication. Please advise CB- [phone]

## 2022-10-19 NOTE — Telephone Encounter (Signed)
Copied from [place] [id number]. Topic: General - Other >> Oct 19, 2022  9:35 AM Everette C wrote: Reason for CRM: The patient has called to share that they have been in contact with [facility] regarding their Dulaglutide (TRULICITY) 3 0000000 SOPN [id number] prescription   The patient shares that [employer] has approved them for coverage of the 1/5 but not 3/0 form of the medication   The patient would like for the practice to get in contact with the [employer] to provide updated information on their prescription   Please contact further when possible

## 2022-10-19 NOTE — Telephone Encounter (Signed)
Paperwork reprinted and left in folder for Dr. Maralyn Sago signature.

## 2022-10-22 NOTE — Telephone Encounter (Signed)
Completed forms signed and left on Alex's desk

## 2022-10-24 ENCOUNTER — Encounter: Payer: Self-pay | Admitting: Urology

## 2022-11-08 ENCOUNTER — Other Ambulatory Visit: Payer: Self-pay | Admitting: Urology

## 2022-11-08 DIAGNOSIS — N281 Cyst of kidney, acquired: Secondary | ICD-10-CM

## 2022-11-09 ENCOUNTER — Other Ambulatory Visit: Payer: Self-pay | Admitting: Family Medicine

## 2022-11-09 DIAGNOSIS — H524 Presbyopia: Secondary | ICD-10-CM | POA: Diagnosis not present

## 2022-11-09 DIAGNOSIS — H52223 Regular astigmatism, bilateral: Secondary | ICD-10-CM | POA: Diagnosis not present

## 2022-11-09 DIAGNOSIS — H04123 Dry eye syndrome of bilateral lacrimal glands: Secondary | ICD-10-CM | POA: Diagnosis not present

## 2022-11-09 DIAGNOSIS — H259 Unspecified age-related cataract: Secondary | ICD-10-CM | POA: Diagnosis not present

## 2022-11-09 DIAGNOSIS — Z135 Encounter for screening for eye and ear disorders: Secondary | ICD-10-CM | POA: Diagnosis not present

## 2022-11-09 DIAGNOSIS — H5203 Hypermetropia, bilateral: Secondary | ICD-10-CM | POA: Diagnosis not present

## 2022-11-09 DIAGNOSIS — Z1231 Encounter for screening mammogram for malignant neoplasm of breast: Secondary | ICD-10-CM

## 2022-11-17 ENCOUNTER — Other Ambulatory Visit: Payer: Self-pay | Admitting: Family Medicine

## 2022-11-30 ENCOUNTER — Inpatient Hospital Stay: Admit: 2022-11-30 | Payer: MEDICARE | Primary: Medical

## 2022-11-30 ENCOUNTER — Encounter

## 2022-11-30 ENCOUNTER — Ambulatory Visit
Admission: RE | Admit: 2022-11-30 | Discharge: 2022-11-30 | Disposition: A | Discharge status: Home / Self Care | Payer: No Typology Code available for payment source | Source: Ambulatory Visit | Attending: Family Medicine | Admitting: Family Medicine

## 2022-11-30 DIAGNOSIS — Z1231 Encounter for screening mammogram for malignant neoplasm of breast: Secondary | ICD-10-CM | POA: Insufficient documentation

## 2022-11-30 DIAGNOSIS — R928 Other abnormal and inconclusive findings on diagnostic imaging of breast: Secondary | ICD-10-CM

## 2022-12-03 DIAGNOSIS — H40003 Preglaucoma, unspecified, bilateral: Secondary | ICD-10-CM | POA: Diagnosis not present

## 2022-12-03 DIAGNOSIS — H2513 Age-related nuclear cataract, bilateral: Secondary | ICD-10-CM | POA: Diagnosis not present

## 2022-12-03 LAB — HM DIABETES EYE EXAM

## 2022-12-06 DIAGNOSIS — D49511 Neoplasm of unspecified behavior of right kidney: Secondary | ICD-10-CM | POA: Diagnosis not present

## 2022-12-13 NOTE — Progress Notes (Signed)
Diabetic Eye Exam abstracted. Please see media

## 2022-12-14 NOTE — Progress Notes (Signed)
Vivien Rota DeSanto,acting as a scribe for Mila Merry, MD.,have documented all relevant documentation on the behalf of Mila Merry, MD,as directed by  Mila Merry, MD while in the presence of Mila Merry, MD.    Established patient visit   Patient: Anna Sweeney   DOB: 1950-12-08   72 y.o. Female  MRN: [id number] Visit Date: 12/15/2022  Today's healthcare provider: Mila Merry, MD   No chief complaint on file.  Subjective    HPI  Diabetes Mellitus Type II, Follow-up  Lab Results  Component Value Date   HGBA1C 7.6 (A) 09/06/2022   HGBA1C 6.7 (A) 02/08/2022   HGBA1C 7.3 (H) 12/02/2021   Wt Readings from Last 3 Encounters:  10/05/22 160 lb (72.6 kg)  09/06/22 160 lb (72.6 kg)  06/04/22 155 lb (70.3 kg)   Last seen for diabetes 3 months ago.  Management since then includes no changes. She reports {excellent/good/fair/poor:19665} compliance with treatment. She {is/is not:21021397} having side effects. {document side effects if present:1} Symptoms: {Yes/No:20286} fatigue {Yes/No:20286} foot ulcerations  {Yes/No:20286} appetite changes {Yes/No:20286} nausea  {Yes/No:20286} paresthesia of the feet  {Yes/No:20286} polydipsia  {Yes/No:20286} polyuria {Yes/No:20286} visual disturbances   {Yes/No:20286} vomiting     Home blood sugar records: {diabetes glucometry results:16657}  Episodes of hypoglycemia? {Yes/No:20286} {enter symptoms and frequency of symptoms if yes:1}   Current insulin regiment: {enter 'none' or type of insulin and number of units taken with each dose of each insulin formulation that the patient is taking:1} Most Recent Eye Exam: *** {Current exercise:16438:::1} {Current diet habits:16563:::1}  Pertinent Labs: Lab Results  Component Value Date   CHOL 173 12/02/2021   HDL 43 12/02/2021   LDLCALC 86 12/02/2021   TRIG 263 (H) 12/02/2021   CHOLHDL 4.0 12/02/2021   Lab Results  Component Value Date   NA 141 05/03/2022   K 4.3 05/03/2022    CREATININE 3.00 (H) 05/03/2022   EGFR 16 (L) 05/03/2022   LABMICR 19.5 12/02/2021   MICRALBCREAT 14 12/02/2021     ---------------------------------------------------------------------------------------------------   Medications: Outpatient Medications Prior to Visit  Medication Sig   Alcohol Swabs (DROPSAFE ALCOHOL PREP) 70 % PADS USE TO CHECK BLOOD SUGAR EVERY DAY.   amLODipine (NORVASC) 5 MG tablet TAKE 1 TABLET EVERY DAY   aspirin EC 81 MG tablet Take 81 mg by mouth daily.   betamethasone dipropionate (DIPROLENE) 0.05 % cream Apply topically 2 (two) times daily as needed.   Blood Glucose Monitoring Suppl (TRUE METRIX AIR GLUCOSE METER) w/Device KIT USE AS DIRECTED   Cholecalciferol (VITAMIN D-3) 125 MCG (5000 UT) TABS Take 5,000 Units by mouth daily.   Clobetasol Prop Emollient Base (CLOBETASOL PROPIONATE E) 0.05 % emollient cream Apply to aa's lower legs BID until clear. Then PRN thereafter. Avoid f/g/a.   Dulaglutide (TRULICITY) 3 MG/0.5ML SOPN Inject 3 mg as directed once a week. Patient receives through [employer] through Dec 2022   glipiZIDE (GLUCOTROL XL) 2.5 MG 24 hr tablet TAKE 1 TABLET EVERY DAY WITH BREAKFAST   lovastatin (MEVACOR) 20 MG tablet TAKE 1 TABLET BY MOUTH (20 MG TOTAL) EVERY EVENING   meclizine (ANTIVERT) 12.5 MG tablet TAKE 1 TABLET (12.5 MG TOTAL) BY MOUTH 2 (TWO) TIMES DAILY AS NEEDED FOR DIZZINESS.   mupirocin cream (BACTROBAN) 2 % Apply 1 application topically 2 (two) times daily.   neomycin-polymyxin-hydrocortisone (CORTISPORIN) OTIC solution Place 3 drops into both ears 2 (two) times daily as needed (itching).   trandolapril (MAVIK) 4 MG tablet TAKE 1 TABLET EVERY  DAY   triamcinolone cream (KENALOG) 0.5 % Apply 1 application topically 2 (two) times daily.   TRUE METRIX BLOOD GLUCOSE TEST test strip TEST BLOOD SUGAR EVERY DAY   TRUEplus Lancets 33G MISC TEST BLOOD SUGAR EVERY DAY   verapamil (VERELAN) 240 MG 24 hr capsule Take 1 capsule (240 mg  total) by mouth at bedtime.   No facility-administered medications prior to visit.    Review of Systems  {Labs  Heme  Chem  Endocrine  Serology  Results Review (optional):23779}   Objective    There were no vitals taken for this visit. {Show previous vital signs (optional):23777}  Physical Exam  ***  No results found for any visits on 12/15/22.  Assessment & Plan     ***  No follow-ups on file.      {provider attestation***:1}   Mila Merry, MD  [facility] [phone] (phone) [phone] (fax)  [facility]

## 2022-12-15 ENCOUNTER — Encounter: Payer: Self-pay | Admitting: Family Medicine

## 2022-12-15 ENCOUNTER — Ambulatory Visit (INDEPENDENT_AMBULATORY_CARE_PROVIDER_SITE_OTHER): Payer: No Typology Code available for payment source | Admitting: Family Medicine

## 2022-12-15 VITALS — BP 150/71 | HR 80 | Ht 67.0 in | Wt 162.0 lb

## 2022-12-15 DIAGNOSIS — E2839 Other primary ovarian failure: Secondary | ICD-10-CM

## 2022-12-15 DIAGNOSIS — I10 Essential (primary) hypertension: Secondary | ICD-10-CM | POA: Diagnosis not present

## 2022-12-15 DIAGNOSIS — E1121 Type 2 diabetes mellitus with diabetic nephropathy: Secondary | ICD-10-CM

## 2022-12-15 DIAGNOSIS — E559 Vitamin D deficiency, unspecified: Secondary | ICD-10-CM | POA: Diagnosis not present

## 2022-12-15 DIAGNOSIS — E1122 Type 2 diabetes mellitus with diabetic chronic kidney disease: Secondary | ICD-10-CM

## 2022-12-15 DIAGNOSIS — E7800 Pure hypercholesterolemia, unspecified: Secondary | ICD-10-CM

## 2022-12-15 DIAGNOSIS — N184 Chronic kidney disease, stage 4 (severe): Secondary | ICD-10-CM | POA: Diagnosis not present

## 2022-12-15 DIAGNOSIS — Z794 Long term (current) use of insulin: Secondary | ICD-10-CM | POA: Diagnosis not present

## 2022-12-15 LAB — POCT GLYCOSYLATED HEMOGLOBIN (HGB A1C)
Est. average glucose Bld gHb Est-mCnc: 177
Hemoglobin A1C: 7.8 % — AB (ref 4.0–5.6)

## 2022-12-15 MED ORDER — LOVASTATIN 20 MG PO TABS: ORAL_TABLET | ORAL | 4 refills | Status: DC

## 2022-12-15 NOTE — Addendum Note (Signed)
Addended by: Shelly Bombard on: 12/15/2022 01:12 PM   Modules accepted: Orders

## 2022-12-15 NOTE — Addendum Note (Signed)
Addended by: Malva Limes on: 12/15/2022 01:11 PM   Modules accepted: Orders

## 2022-12-15 NOTE — Addendum Note (Signed)
Addended by: Shelly Bombard on: 12/15/2022 01:17 PM   Modules accepted: Orders

## 2022-12-16 LAB — MICROALBUMIN / CREATININE URINE RATIO
Creatinine, Urine: 103.1 mg/dL
Microalb/Creat Ratio: 87 mg/g creat — ABNORMAL HIGH (ref 0–29)
Microalbumin, Urine: 90 ug/mL

## 2022-12-20 DIAGNOSIS — E559 Vitamin D deficiency, unspecified: Secondary | ICD-10-CM | POA: Diagnosis not present

## 2022-12-20 DIAGNOSIS — N184 Chronic kidney disease, stage 4 (severe): Secondary | ICD-10-CM | POA: Diagnosis not present

## 2022-12-20 DIAGNOSIS — E7800 Pure hypercholesterolemia, unspecified: Secondary | ICD-10-CM | POA: Diagnosis not present

## 2022-12-21 LAB — PARATHYROID HORMONE, INTACT (NO CA): PTH: 28 pg/mL (ref 15–65)

## 2022-12-21 LAB — COMPREHENSIVE METABOLIC PANEL
ALT: 23 IU/L (ref 0–32)
AST: 30 IU/L (ref 0–40)
Albumin/Globulin Ratio: 1.5 (ref 1.2–2.2)
Albumin: 4.4 g/dL (ref 3.8–4.8)
Alkaline Phosphatase: 89 IU/L (ref 44–121)
BUN/Creatinine Ratio: 11 — ABNORMAL LOW (ref 12–28)
BUN: 28 mg/dL — ABNORMAL HIGH (ref 8–27)
Bilirubin Total: 0.3 mg/dL (ref 0.0–1.2)
CO2: 21 mmol/L (ref 20–29)
Calcium: 9.7 mg/dL (ref 8.7–10.3)
Chloride: 101 mmol/L (ref 96–106)
Creatinine, Ser: 2.65 mg/dL — ABNORMAL HIGH (ref 0.57–1.00)
Globulin, Total: 2.9 g/dL (ref 1.5–4.5)
Glucose: 135 mg/dL — ABNORMAL HIGH (ref 70–99)
Potassium: 4.6 mmol/L (ref 3.5–5.2)
Sodium: 139 mmol/L (ref 134–144)
Total Protein: 7.3 g/dL (ref 6.0–8.5)
eGFR: 19 mL/min/{1.73_m2} — ABNORMAL LOW (ref >59)

## 2022-12-21 LAB — LIPID PANEL
Chol/HDL Ratio: 3.9 ratio (ref 0.0–4.4)
Cholesterol, Total: 165 mg/dL (ref 100–199)
HDL: 42 mg/dL (ref >39)
LDL Chol Calc (NIH): 82 mg/dL (ref 0–99)
Triglycerides: 245 mg/dL — ABNORMAL HIGH (ref 0–149)
VLDL Cholesterol Cal: 41 mg/dL — ABNORMAL HIGH (ref 5–40)

## 2022-12-21 LAB — CBC
Hematocrit: 39.9 % (ref 34.0–46.6)
Hemoglobin: 13 g/dL (ref 11.1–15.9)
MCH: 28.2 pg (ref 26.6–33.0)
MCHC: 32.6 g/dL (ref 31.5–35.7)
MCV: 87 fL (ref 79–97)
Platelets: 227 10*3/uL (ref 150–450)
RBC: 4.61 x10E6/uL (ref 3.77–5.28)
RDW: 13.9 % (ref 11.7–15.4)
WBC: 7.8 10*3/uL (ref 3.4–10.8)

## 2022-12-21 LAB — VITAMIN D 25 HYDROXY (VIT D DEFICIENCY, FRACTURES): Vit D, 25-Hydroxy: 72.8 ng/mL (ref 30.0–100.0)

## 2023-01-21 ENCOUNTER — Ambulatory Visit (INDEPENDENT_AMBULATORY_CARE_PROVIDER_SITE_OTHER): Payer: No Typology Code available for payment source | Admitting: Family Medicine

## 2023-01-21 ENCOUNTER — Encounter: Payer: Self-pay | Admitting: Family Medicine

## 2023-01-21 VITALS — BP 122/64 | HR 80 | Temp 98.1°F | Resp 16 | Wt 166.0 lb

## 2023-01-21 DIAGNOSIS — M545 Low back pain, unspecified: Secondary | ICD-10-CM

## 2023-01-21 MED ORDER — CYCLOBENZAPRINE HCL 5 MG PO TABS
5.0000 mg | ORAL_TABLET | Freq: Three times a day (TID) | ORAL | 0 refills | Status: DC | PRN
Start: 2023-01-21 — End: 2023-02-04

## 2023-01-21 MED ORDER — NAPROXEN 500 MG PO TABS
500.00 mg | ORAL_TABLET | Freq: Two times a day (BID) | ORAL | 0 refills | Status: DC
Start: 2023-01-21 — End: 2023-02-14

## 2023-01-21 NOTE — Progress Notes (Signed)
Established patient visit   Patient: Anna Sweeney   DOB: 07-28-1951   72 y.o. Female  MRN: [id number] Visit Date: 01/21/2023  Today's healthcare provider: Mila Merry, MD   Chief Complaint  Patient presents with   Back Pain   Subjective    Discussed the use of AI scribe software for clinical note transcription with the patient, who gave verbal consent to proceed.  History of Present Illness   The patient presents with back pain that started almost a week ago after trying to help her mother-in-law out of bed. The pain is located in the lower back, extending across the back but is worse on the left side. The patient denies any radiating pain into the legs and reports feeling better when standing up. She has been using an Nurse, children's and a lidocaine patch for relief, and a niece who works for the [employer] gave her some muscle relaxers, which have also helped. The patient is unsure of the names of the muscle relaxers. The patient also reports a burning sensation and a feeling of swelling in the area of pain.  In addition to the back pain, the patient has noticed weight gain despite eating healthy choice meals. She has not been exercising regularly but plans to start a walking routine. The patient has a bone density test scheduled for July.       Medications: Outpatient Medications Prior to Visit  Medication Sig   Alcohol Swabs (DROPSAFE ALCOHOL PREP) 70 % PADS USE TO CHECK BLOOD SUGAR EVERY DAY.   amLODipine (NORVASC) 5 MG tablet TAKE 1 TABLET EVERY DAY   aspirin EC 81 MG tablet Take 81 mg by mouth daily.   betamethasone dipropionate (DIPROLENE) 0.05 % cream Apply topically 2 (two) times daily as needed.   Blood Glucose Monitoring Suppl (TRUE METRIX AIR GLUCOSE METER) w/Device KIT USE AS DIRECTED   Cholecalciferol (VITAMIN D-3) 125 MCG (5000 UT) TABS Take 5,000 Units by mouth daily.   Clobetasol Prop Emollient Base (CLOBETASOL PROPIONATE E) 0.05 % emollient cream Apply to  aa's lower legs BID until clear. Then PRN thereafter. Avoid f/g/a.   Dulaglutide (TRULICITY) 3 MG/0.5ML SOPN Inject 3 mg as directed once a week. Patient receives through [facility] through Dec 2022   glipiZIDE (GLUCOTROL XL) 2.5 MG 24 hr tablet TAKE 1 TABLET EVERY DAY WITH BREAKFAST   lovastatin (MEVACOR) 20 MG tablet TAKE 1 TABLET BY MOUTH (20 MG TOTAL) EVERY EVENING   meclizine (ANTIVERT) 12.5 MG tablet TAKE 1 TABLET (12.5 MG TOTAL) BY MOUTH 2 (TWO) TIMES DAILY AS NEEDED FOR DIZZINESS.   mupirocin cream (BACTROBAN) 2 % Apply 1 application topically 2 (two) times daily.   neomycin-polymyxin-hydrocortisone (CORTISPORIN) OTIC solution Place 3 drops into both ears 2 (two) times daily as needed (itching).   triamcinolone cream (KENALOG) 0.5 % Apply 1 application topically 2 (two) times daily.   TRUE METRIX BLOOD GLUCOSE TEST test strip TEST BLOOD SUGAR EVERY DAY   TRUEplus Lancets 33G MISC TEST BLOOD SUGAR EVERY DAY   verapamil (VERELAN) 240 MG 24 hr capsule Take 1 capsule (240 mg total) by mouth at bedtime.   [DISCONTINUED] trandolapril (MAVIK) 4 MG tablet TAKE 1 TABLET EVERY DAY   No facility-administered medications prior to visit.       Objective    BP 122/64 (BP Location: Left Arm, Patient Position: Sitting, Cuff Size: Normal)   Pulse 80   Temp 98.1 F (36.7 C) (Temporal)   Resp  16   Wt 166 lb (75.3 kg)   SpO2 98%   BMI 26.00 kg/m    Physical Exam  Tender bilateral para lumbar muscles, worse on left. Normal MS. Mildly swollen on left. No spine tenderness. No other gross deformities.    Assessment & Plan     Assessment and Plan    Lower Back Pain: Acute onset following lifting, localized to the left lower back with no radiation to the legs. No neurological symptoms. Pain is worse with movement and relieved by rest. Patient has been using over-the-counter lidocaine and Icy Hot patches with some relief. -Prescribe muscle relaxer (Flexeril 5mg  as needed). -Prescribe  anti-inflammatory medication (Naprosyn or Meloxicam, twice daily for a week).  Weight Gain: Patient reports recent weight gain. Last A1c was 7.8, indicating fair glycemic control. Patient is not currently exercising regularly. -Encourage regular exercise (30 minutes daily or 1 hour every other day). -Schedule follow-up appointment in September to check A1c and possibly thyroid function.            Mila Merry, MD  [facility] [phone] (phone) [phone] (fax)  [facility]

## 2023-01-21 NOTE — Patient Instructions (Signed)
.   Please review the attached list of medications and notify my office if there are any errors.   . Please bring all of your medications to every appointment so we can make sure that our medication list is the same as yours.   

## 2023-02-02 ENCOUNTER — Other Ambulatory Visit: Payer: Self-pay | Admitting: Family Medicine

## 2023-02-02 DIAGNOSIS — M545 Low back pain, unspecified: Secondary | ICD-10-CM

## 2023-02-03 ENCOUNTER — Other Ambulatory Visit: Payer: Self-pay | Admitting: Family Medicine

## 2023-02-03 DIAGNOSIS — M545 Low back pain, unspecified: Secondary | ICD-10-CM

## 2023-02-03 NOTE — Telephone Encounter (Signed)
Requested medication (s) are due for refill today:   Provider to review  Requested medication (s) are on the active medication list:   Yes  Future visit scheduled:   Yes   Last ordered: 01/21/2023 #30, 0 refills  Returned because this is a duplicate request for a non delegated medication.   Requested Prescriptions  Pending Prescriptions Disp Refills   cyclobenzaprine (FLEXERIL) 5 MG tablet [Pharmacy Med Name: CYCLOBENZAPRINE 5 MG TABLET] 30 tablet 0    Sig: TAKE 1-2 TABLETS BY MOUTH 3 TIMES DAILY AS NEEDED FOR MUSCLE SPASMS.     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[facility])   [place] [facility] Malva Limes, MD   5 months ago Type 2 diabetes mellitus with stage 4 chronic kidney disease, without long-term current use of insulin (HCC)   [place] [facility] Malva Limes, MD   12 months ago Type 2 diabetes mellitus with stage 3b chronic kidney disease, without long-term current use of insulin (HCC)   [place] [facility] Malva Limes, MD   1 year ago Dizziness   [place] [facility] Malva Limes, MD       Future Appointments             In 2 months Fisher, Demetrios Isaacs, MD [facility]

## 2023-02-04 NOTE — Telephone Encounter (Signed)
Requested medication (s) are due for refill today: routing for review  Requested medication (s) are on the active medication list: yes  Last refill:  01/21/23  Future visit scheduled: yes  Notes to clinic:  Medication was given for a short supply 01/21/23 (15 days) should patient continue to take? Routing for review.     Requested Prescriptions  Pending Prescriptions Disp Refills   naproxen (NAPROSYN) 500 MG tablet [Pharmacy Med Name: NAPROXEN 500 MG TABLET] 30 tablet 0    Sig: TAKE 1 TABLET BY MOUTH 2 TIMES DAILY WITH A MEAL.     Analgesics:  NSAIDS Failed - 02/03/2023  1:03 PM      Failed - Manual Review: Labs are only required if the patient has taken medication for more than 8 weeks.      Failed - Cr in normal range and within 360 days    Creatinine, Ser  Date Value Ref Range Status  12/20/2022 2.65 (H) 0.57 - 1.00 mg/dL Final   Creatinine, POC  Date Value Ref Range Status  07/17/2015 N/A mg/dL Final         Failed - eGFR is 30 or above and within 360 days    GFR calc Af Amer  Date Value Ref Range Status  11/19/2019 36 (L) >59 mL/min/1.73 Final   GFR calc non Af Amer  Date Value Ref Range Status  11/19/2019 31 (L) >59 mL/min/1.73 Final   eGFR  Date Value Ref Range Status  12/20/2022 19 (L) >59 mL/min/1.73 Final         Passed - HGB in normal range and within 360 days    Hemoglobin  Date Value Ref Range Status  12/20/2022 13.0 11.1 - 15.9 g/dL Final         Passed - PLT in normal range and within 360 days    Platelets  Date Value Ref Range Status  12/20/2022 227 150 - 450 x10E3/uL Final         Passed - HCT in normal range and within 360 days    Hematocrit  Date Value Ref Range Status  12/20/2022 39.9 34.0 - 46.6 % Final         Passed - Patient is not pregnant      Passed - Valid encounter within last 12 months    Recent Outpatient Visits           2 weeks ago Acute bilateral low back pain without sciatica   [place] [facility]  Malva Limes, MD   1 month ago Diabetes mellitus with nephropathy [facility])   [place] [facility] Malva Limes, MD   5 months ago Type 2 diabetes mellitus with stage 4 chronic kidney disease, without long-term current use of insulin (HCC)   [place] [facility] Malva Limes, MD   12 months ago Type 2 diabetes mellitus with stage 3b chronic kidney disease, without long-term current use of insulin (HCC)   [place] [facility] Malva Limes, MD   1 year ago Dizziness   [place] [facility] Malva Limes, MD       Future Appointments             In 2 months Fisher, Demetrios Isaacs, MD [facility], [facility]

## 2023-02-07 NOTE — Telephone Encounter (Signed)
Please advise 

## 2023-02-17 ENCOUNTER — Ambulatory Visit
Admission: RE | Admit: 2023-02-17 | Discharge: 2023-02-17 | Disposition: A | Discharge status: Home / Self Care | Payer: No Typology Code available for payment source | Source: Ambulatory Visit | Attending: Family Medicine | Admitting: Family Medicine

## 2023-02-17 DIAGNOSIS — E2839 Other primary ovarian failure: Secondary | ICD-10-CM | POA: Insufficient documentation

## 2023-02-17 DIAGNOSIS — M85852 Other specified disorders of bone density and structure, left thigh: Secondary | ICD-10-CM | POA: Diagnosis not present

## 2023-02-17 DIAGNOSIS — Z780 Asymptomatic menopausal state: Secondary | ICD-10-CM | POA: Diagnosis not present

## 2023-02-28 DIAGNOSIS — I10 Essential (primary) hypertension: Secondary | ICD-10-CM | POA: Diagnosis not present

## 2023-02-28 DIAGNOSIS — E1122 Type 2 diabetes mellitus with diabetic chronic kidney disease: Secondary | ICD-10-CM | POA: Diagnosis not present

## 2023-02-28 DIAGNOSIS — N184 Chronic kidney disease, stage 4 (severe): Secondary | ICD-10-CM | POA: Diagnosis not present

## 2023-02-28 DIAGNOSIS — N281 Cyst of kidney, acquired: Secondary | ICD-10-CM | POA: Diagnosis not present

## 2023-02-28 DIAGNOSIS — R809 Proteinuria, unspecified: Secondary | ICD-10-CM | POA: Diagnosis not present

## 2023-02-28 DIAGNOSIS — E1129 Type 2 diabetes mellitus with other diabetic kidney complication: Secondary | ICD-10-CM | POA: Diagnosis not present

## 2023-03-15 ENCOUNTER — Other Ambulatory Visit: Payer: Self-pay | Admitting: Family Medicine

## 2023-03-15 DIAGNOSIS — I10 Essential (primary) hypertension: Secondary | ICD-10-CM

## 2023-03-15 NOTE — Telephone Encounter (Signed)
Medication Refill - Medication: Amlodipine 5 mg  Has the patient contacted their pharmacy? Yes.   (Agent: If no, request that the patient contact the pharmacy for the refill. If patient does not wish to contact the pharmacy document the reason why and proceed with request.) (Agent: If yes, when and what did the pharmacy advise?)  Preferred Pharmacy (with phone number or street name): [facility] Has the patient been seen for an appointment in the last year OR does the patient have an upcoming appointment? Yes.    Agent: Please be advised that RX refills may take up to 3 business days. We ask that you follow-up with your pharmacy.

## 2023-03-16 MED ORDER — AMLODIPINE BESYLATE 5 MG PO TABS
5.0000 mg | ORAL_TABLET | Freq: Every day | ORAL | 3 refills | Status: DC
Start: 2023-03-16 — End: 2024-02-27

## 2023-03-16 NOTE — Telephone Encounter (Signed)
Requested Prescriptions  Pending Prescriptions Disp Refills   amLODipine (NORVASC) 5 MG tablet 90 tablet 3    Sig: Take 1 tablet (5 mg total) by mouth daily.     Cardiovascular: Calcium Channel Blockers 2 Passed - 03/15/2023  1:34 PM      Passed - Last BP in normal range    BP Readings from Last 1 Encounters:  01/21/23 122/64         Passed - Last Heart Rate in normal range    Pulse Readings from Last 1 Encounters:  01/21/23 80         Passed - Valid encounter within last 6 months    Recent Outpatient Visits           1 month ago Acute bilateral low back pain without sciatica   [facility] Malva Limes, MD   3 months ago Diabetes mellitus with nephropathy [facility])   [place] [facility] Malva Limes, MD   6 months ago Type 2 diabetes mellitus with stage 4 chronic kidney disease, without long-term current use of insulin (HCC)   [place] [facility] Malva Limes, MD   1 year ago Type 2 diabetes mellitus with stage 3b chronic kidney disease, without long-term current use of insulin (HCC)   [place] [facility] Malva Limes, MD   1 year ago Dizziness   [place] [facility] Malva Limes, MD       Future Appointments             In 1 month Fisher, Demetrios Isaacs, MD [facility]

## 2023-04-22 ENCOUNTER — Other Ambulatory Visit: Payer: Self-pay | Admitting: Interventional Radiology

## 2023-04-22 DIAGNOSIS — N2889 Other specified disorders of kidney and ureter: Secondary | ICD-10-CM

## 2023-04-25 ENCOUNTER — Ambulatory Visit
Admission: RE | Admit: 2023-04-25 | Discharge: 2023-04-25 | Disposition: A | Discharge status: Home / Self Care | Payer: No Typology Code available for payment source | Source: Ambulatory Visit | Attending: Family Medicine | Admitting: Family Medicine

## 2023-04-25 ENCOUNTER — Ambulatory Visit
Admission: RE | Admit: 2023-04-25 | Discharge: 2023-04-25 | Disposition: A | Discharge status: Home / Self Care | Payer: No Typology Code available for payment source | Attending: Family Medicine | Admitting: Family Medicine

## 2023-04-25 ENCOUNTER — Ambulatory Visit (INDEPENDENT_AMBULATORY_CARE_PROVIDER_SITE_OTHER): Payer: No Typology Code available for payment source | Admitting: Family Medicine

## 2023-04-25 VITALS — BP 140/80 | HR 79 | Temp 98.3°F | Resp 16 | Ht 67.0 in | Wt 157.0 lb

## 2023-04-25 DIAGNOSIS — M545 Low back pain, unspecified: Secondary | ICD-10-CM

## 2023-04-25 DIAGNOSIS — M542 Cervicalgia: Secondary | ICD-10-CM | POA: Diagnosis not present

## 2023-04-25 DIAGNOSIS — Z23 Encounter for immunization: Secondary | ICD-10-CM

## 2023-04-25 DIAGNOSIS — M47812 Spondylosis without myelopathy or radiculopathy, cervical region: Secondary | ICD-10-CM | POA: Diagnosis not present

## 2023-04-25 DIAGNOSIS — M5412 Radiculopathy, cervical region: Secondary | ICD-10-CM | POA: Diagnosis not present

## 2023-04-25 DIAGNOSIS — I471 Supraventricular tachycardia, unspecified: Secondary | ICD-10-CM

## 2023-04-25 NOTE — Progress Notes (Signed)
Established patient visit   Patient: Anna Sweeney   DOB: 06-Sep-1950   72 y.o. Female  MRN: [id number] Visit Date: 04/25/2023  Today's healthcare provider: Mila Merry, MD   Chief Complaint  Patient presents with   Diabetes    Patient states she does not check her glucose at home   Subjective    Discussed the use of AI scribe software for clinical note transcription with the patient, who gave verbal consent to proceed.  History of Present Illness   Patient with a history of diabetes and hypertension, presents with persistent back pain for the last 3 months that has not improved. The pain is severe enough to cause difficulty getting out of bed in the morning, requiring the patient to sit up and wait for a few minutes before slowly standing. The patient reports that the pain radiates across the back and has recently started extending down the left arm, causing a burning sensation. There is no reported pain in the neck or upper back, and the pain does not extend into the legs. However, the patient notes some discomfort in the left hip.  Regarding the patient's diabetes, she admits to not checking her blood sugar levels frequently but did so after consuming a cup of hot cocoa on the day of the visit. The patient is currently on Trulicity (3 mg) and Glipizide, with no reported side effects. The most recent A1c level was 7.6, slightly improved from the previous 7.8.  The patient's hypertension is managed with Amlodipine and Verapamil. She occasionally checks her blood pressure at home, which typically reads around 130/70. The patient's blood pressure was slightly elevated during the visit.  Previously, the patient was prescribed muscle relaxers (Flexeril) and Naproxen for the back pain. However, the Flexeril only made her sleepy, and the Naproxen was discontinued after a few doses due to the doctor's dissatisfaction. The effectiveness of these medications on the back pain is unclear to the  patient.       Medications: Outpatient Medications Prior to Visit  Medication Sig   Alcohol Swabs (DROPSAFE ALCOHOL PREP) 70 % PADS USE TO CHECK BLOOD SUGAR EVERY DAY.   amLODipine (NORVASC) 5 MG tablet Take 1 tablet (5 mg total) by mouth daily.   aspirin EC 81 MG tablet Take 81 mg by mouth daily.   betamethasone dipropionate (DIPROLENE) 0.05 % cream Apply topically 2 (two) times daily as needed.   Blood Glucose Monitoring Suppl (TRUE METRIX AIR GLUCOSE METER) w/Device KIT USE AS DIRECTED   Cholecalciferol (VITAMIN D-3) 125 MCG (5000 UT) TABS Take 5,000 Units by mouth daily.   Clobetasol Prop Emollient Base (CLOBETASOL PROPIONATE E) 0.05 % emollient cream Apply to aa's lower legs BID until clear. Then PRN thereafter. Avoid f/g/a.   cyclobenzaprine (FLEXERIL) 5 MG tablet TAKE 1-2 TABLETS BY MOUTH 3 TIMES DAILY AS NEEDED FOR MUSCLE SPASMS.   Dulaglutide (TRULICITY) 3 MG/0.5ML SOPN Inject 3 mg as directed once a week. Patient receives through [facility] through Dec 2022   glipiZIDE (GLUCOTROL XL) 2.5 MG 24 hr tablet TAKE 1 TABLET EVERY DAY WITH BREAKFAST   lovastatin (MEVACOR) 20 MG tablet TAKE 1 TABLET BY MOUTH (20 MG TOTAL) EVERY EVENING   meclizine (ANTIVERT) 12.5 MG tablet TAKE 1 TABLET (12.5 MG TOTAL) BY MOUTH 2 (TWO) TIMES DAILY AS NEEDED FOR DIZZINESS.   mupirocin cream (BACTROBAN) 2 % Apply 1 application topically 2 (two) times daily.   naproxen (NAPROSYN) 500 MG tablet TAKE 1 TABLET  BY MOUTH 2 TIMES DAILY WITH A MEAL.   neomycin-polymyxin-hydrocortisone (CORTISPORIN) OTIC solution Place 3 drops into both ears 2 (two) times daily as needed (itching).   triamcinolone cream (KENALOG) 0.5 % Apply 1 application topically 2 (two) times daily.   TRUE METRIX BLOOD GLUCOSE TEST test strip TEST BLOOD SUGAR EVERY DAY   TRUEplus Lancets 33G MISC TEST BLOOD SUGAR EVERY DAY   verapamil (VERELAN) 240 MG 24 hr capsule Take 1 capsule (240 mg total) by mouth at bedtime.   No  facility-administered medications prior to visit.   Review of Systems  Constitutional:  Negative for appetite change, chills, fatigue and fever.  Respiratory:  Negative for chest tightness and shortness of breath.   Cardiovascular:  Negative for chest pain and palpitations.  Gastrointestinal:  Negative for abdominal pain, nausea and vomiting.  Neurological:  Negative for dizziness and weakness.       Objective    BP (!) 140/80 (BP Location: Left Arm, Patient Position: Sitting, Cuff Size: Normal)   Pulse 79   Temp 98.3 F (36.8 C) (Oral)   Resp 16   Ht 5\' 7"  (1.702 m)   Wt 157 lb (71.2 kg)   BMI 24.59 kg/m   Physical Exam   General: Appearance:    Well developed, well nourished female in no acute distress  Eyes:    PERRL, conjunctiva/corneas clear, EOM's intact       Lungs:     Clear to auscultation bilaterally, respirations unlabored  Heart:    Normal heart rate. Normal rhythm. No murmurs, rubs, or gallops.    MS:   All extremities are intact.    Neurologic:   Awake, alert, oriented x 3. No apparent focal neurological defect.         Assessment & Plan        Back Pain Chronic back pain, now radiating to left arm. No improvement with Flexeril and Naproxen. Pain affecting mobility and sleep. -Order X-rays of upper and lower spine to evaluate for possible compression fractures or spinal stenosis. -Consider referral to PT or ortho based on X-ray results.  Type 2 Diabetes Mellitus A1c 7.6, slightly improved from 7.8. Patient inconsistently checks blood glucose levels. -Continue Trulicity 3mg  weekly and Glipizide daily. -Schedule follow-up in four months to monitor A1c and blood pressure.  Hypertension Blood pressure slightly elevated in office, but patient reports readings around 130/70 at home. -Continue Amlodipine and Verapamil. -Encourage regular home blood pressure monitoring.  General Health Maintenance -Flu vaccine administered today.    Return in about 4 months  (around 08/25/2023) for Hypertension, Diabetes.     SVT - Well controlled on current dose of verapamil.    Mila Merry, MD  [facility] [phone] (phone) [phone] (fax)  [facility]

## 2023-04-25 NOTE — Patient Instructions (Addendum)
Please review the attached list of medications and notify my office if there are any errors.   Go to the [facility] for neck and lower back Xrays

## 2023-04-28 ENCOUNTER — Other Ambulatory Visit: Payer: Self-pay | Admitting: Interventional Radiology

## 2023-04-28 DIAGNOSIS — N2889 Other specified disorders of kidney and ureter: Secondary | ICD-10-CM

## 2023-05-02 DIAGNOSIS — I129 Hypertensive chronic kidney disease with stage 1 through stage 4 chronic kidney disease, or unspecified chronic kidney disease: Secondary | ICD-10-CM | POA: Diagnosis not present

## 2023-05-02 DIAGNOSIS — Z008 Encounter for other general examination: Secondary | ICD-10-CM | POA: Diagnosis not present

## 2023-05-02 DIAGNOSIS — N184 Chronic kidney disease, stage 4 (severe): Secondary | ICD-10-CM | POA: Diagnosis not present

## 2023-05-02 DIAGNOSIS — R2681 Unsteadiness on feet: Secondary | ICD-10-CM | POA: Diagnosis not present

## 2023-05-02 DIAGNOSIS — E1122 Type 2 diabetes mellitus with diabetic chronic kidney disease: Secondary | ICD-10-CM | POA: Diagnosis not present

## 2023-05-02 DIAGNOSIS — I700 Atherosclerosis of aorta: Secondary | ICD-10-CM | POA: Diagnosis not present

## 2023-05-02 DIAGNOSIS — F17211 Nicotine dependence, cigarettes, in remission: Secondary | ICD-10-CM | POA: Diagnosis not present

## 2023-05-02 DIAGNOSIS — E785 Hyperlipidemia, unspecified: Secondary | ICD-10-CM | POA: Diagnosis not present

## 2023-05-02 DIAGNOSIS — M545 Low back pain, unspecified: Secondary | ICD-10-CM | POA: Diagnosis not present

## 2023-05-02 DIAGNOSIS — Z6824 Body mass index (BMI) 24.0-24.9, adult: Secondary | ICD-10-CM | POA: Diagnosis not present

## 2023-05-02 DIAGNOSIS — E1169 Type 2 diabetes mellitus with other specified complication: Secondary | ICD-10-CM | POA: Diagnosis not present

## 2023-05-03 ENCOUNTER — Telehealth: Payer: Self-pay | Admitting: Family Medicine

## 2023-05-03 DIAGNOSIS — I471 Supraventricular tachycardia, unspecified: Secondary | ICD-10-CM

## 2023-05-03 MED ORDER — VERAPAMIL HCL ER 240 MG PO TBCR
240.0000 mg | EXTENDED_RELEASE_TABLET | Freq: Every day | ORAL | 1 refills | Status: DC
Start: 2023-05-03 — End: 2023-09-20

## 2023-05-03 NOTE — Telephone Encounter (Signed)
Please advise have sent prescription for tablet formulation

## 2023-05-03 NOTE — Telephone Encounter (Signed)
Pt states her insurance would like the pt to switch to the tablet for   verapamil (VERELAN) 240 MG    Pt states she is paying a copay w/ the tablet there is a no copay. Ok to switch?  [id number] - [place], [place] - [address]

## 2023-05-05 ENCOUNTER — Telehealth: Payer: Self-pay

## 2023-05-05 NOTE — Telephone Encounter (Signed)
Copied from CRM [id number]. Topic: General - Other >> May 05, 2023  9:00 AM Franchot Heidelberg wrote: Reason for CRM: Pt called for imaging results, please advise

## 2023-05-09 ENCOUNTER — Other Ambulatory Visit: Payer: Self-pay | Admitting: Family Medicine

## 2023-05-09 DIAGNOSIS — S22080A Wedge compression fracture of T11-T12 vertebra, initial encounter for closed fracture: Secondary | ICD-10-CM

## 2023-05-09 DIAGNOSIS — M8080XA Other osteoporosis with current pathological fracture, unspecified site, initial encounter for fracture: Secondary | ICD-10-CM

## 2023-05-09 MED ORDER — TRAMADOL HCL 50 MG PO TABS
50.0000 mg | ORAL_TABLET | Freq: Three times a day (TID) | ORAL | 0 refills | Status: AC | PRN
Start: 2023-05-09 — End: 2023-05-14

## 2023-05-09 MED ORDER — ALENDRONATE SODIUM 70 MG PO TABS
70.0000 mg | ORAL_TABLET | ORAL | 5 refills | Status: DC
Start: 2023-05-09 — End: 2023-08-24

## 2023-05-12 DIAGNOSIS — N184 Chronic kidney disease, stage 4 (severe): Secondary | ICD-10-CM | POA: Diagnosis not present

## 2023-05-13 ENCOUNTER — Ambulatory Visit
Admission: RE | Admit: 2023-05-13 | Discharge: 2023-05-13 | Disposition: A | Discharge status: Home / Self Care | Payer: No Typology Code available for payment source | Source: Ambulatory Visit | Attending: Interventional Radiology | Admitting: Interventional Radiology

## 2023-05-13 DIAGNOSIS — N281 Cyst of kidney, acquired: Secondary | ICD-10-CM | POA: Diagnosis not present

## 2023-05-13 DIAGNOSIS — N2889 Other specified disorders of kidney and ureter: Secondary | ICD-10-CM

## 2023-05-13 DIAGNOSIS — K5730 Diverticulosis of large intestine without perforation or abscess without bleeding: Secondary | ICD-10-CM | POA: Diagnosis not present

## 2023-05-13 DIAGNOSIS — K760 Fatty (change of) liver, not elsewhere classified: Secondary | ICD-10-CM | POA: Diagnosis not present

## 2023-05-13 MED ORDER — GADOPICLENOL 0.5 MMOL/ML IV SOLN
7.5000 mL | Freq: Once | INTRAVENOUS | Status: AC | PRN
  Administered 2023-05-13: 7 mL via INTRAVENOUS

## 2023-05-16 ENCOUNTER — Encounter

## 2023-05-16 ENCOUNTER — Inpatient Hospital Stay: Admit: 2023-05-16 | Payer: MEDICARE | Primary: Medical

## 2023-05-16 ENCOUNTER — Telehealth: Payer: Self-pay

## 2023-05-16 DIAGNOSIS — Z1231 Encounter for screening mammogram for malignant neoplasm of breast: Secondary | ICD-10-CM

## 2023-05-16 NOTE — Telephone Encounter (Signed)
Copied from CRM [id number]. Topic: Referral - Request for Referral >> May 16, 2023 10:01 AM Haroldine Laws wrote: Reason for CRM: pt called saying she would like a referral to Ortho doctor.  She says this is for her back and shoulder pain.  She has seen Dr. Sherrie Mustache about it before.  CB#  [phone]

## 2023-05-17 NOTE — Telephone Encounter (Signed)
Please review

## 2023-05-18 ENCOUNTER — Telehealth: Payer: Self-pay

## 2023-05-18 DIAGNOSIS — M5412 Radiculopathy, cervical region: Secondary | ICD-10-CM

## 2023-05-18 DIAGNOSIS — S22080A Wedge compression fracture of T11-T12 vertebra, initial encounter for closed fracture: Secondary | ICD-10-CM

## 2023-05-18 NOTE — Telephone Encounter (Signed)
Copied from CRM [id number]. Topic: Referral - Request for Referral >> May 17, 2023  1:37 PM Phill Myron wrote: 2nd Call Ms Legrande is calling again in regards to getting a referral to an ortho doctor for her back and shoulder pain , she said  Dr. Sherrie Mustache is aware of this issue.

## 2023-05-19 NOTE — Addendum Note (Signed)
Addended by: Malva Limes on: 05/19/2023 08:52 AM   Modules accepted: Orders

## 2023-05-20 ENCOUNTER — Ambulatory Visit
Admission: RE | Admit: 2023-05-20 | Discharge: 2023-05-20 | Disposition: A | Discharge status: Home / Self Care | Payer: No Typology Code available for payment source | Source: Ambulatory Visit | Attending: Interventional Radiology | Admitting: Interventional Radiology

## 2023-05-20 DIAGNOSIS — N2889 Other specified disorders of kidney and ureter: Secondary | ICD-10-CM | POA: Diagnosis not present

## 2023-05-20 HISTORY — PX: IR RADIOLOGIST EVAL & MGMT: IMG5224

## 2023-05-20 NOTE — Progress Notes (Signed)
Chief Complaint: Bosniak 3 Right Renal Lesion  Referring Physician(s): Stoioff,Scott C   History of Present Illness: Anna Sweeney is a 72 y.o. female with a history of Bosniak 3 right renal lesion who returns to IR clinic today for follow up of her right renal lesion.  I initially met her on 06/15/2022 for consideration for ablation of this lesion.  It had not demonstrated significant growth since 2021 and we decide to observe rather than ablate.  The location of the lesion makes is high risk for hemorrhage as well as possible injury to the duodenum.  Her most recent MRI on 05/13/2023 showed that the lesion has increased in size, now measuring 2.9 x 2.3 cm, compared to 2.2 x 2.0 cm on 10/18/2019 and 2.3 x 2.0 cm on 10/17/2020.  Enhancing internal septations were again seen.  She feels well today and does not report any hematuria, flank pain, or unintentional weight loss.  Past Medical History:  Diagnosis Date   BRCA negative 11/15/2012   [facility]   Diabetes mellitus without complication (HCC)    History of chicken pox    Hypertension     Past Surgical History:  Procedure Laterality Date   FOOT SURGERY     IR RADIOLOGIST EVAL & MGMT  06/15/2022    Allergies: Pioglitazone  Medications: Prior to Admission medications   Medication Sig Start Date End Date Taking? Authorizing Provider  Alcohol Swabs (DROPSAFE ALCOHOL PREP) 70 % PADS USE TO CHECK BLOOD SUGAR EVERY DAY. 03/01/22   Malva Limes, MD  alendronate (FOSAMAX) 70 MG tablet Take 1 tablet (70 mg total) by mouth every 7 (seven) days. Take with a full glass of water on an empty stomach. 05/09/23   Malva Limes, MD  amLODipine (NORVASC) 5 MG tablet Take 1 tablet (5 mg total) by mouth daily. 03/16/23   Malva Limes, MD  aspirin EC 81 MG tablet Take 81 mg by mouth daily.    [provider]  betamethasone dipropionate (DIPROLENE) 0.05 % cream Apply topically 2 (two) times daily as needed. 01/16/19   Malva Limes,  MD  Blood Glucose Monitoring Suppl (TRUE METRIX AIR GLUCOSE METER) w/Device KIT USE AS DIRECTED 09/21/21   Malva Limes, MD  Cholecalciferol (VITAMIN D-3) 125 MCG (5000 UT) TABS Take 5,000 Units by mouth daily.    [provider]  Clobetasol Prop Emollient Base (CLOBETASOL PROPIONATE E) 0.05 % emollient cream Apply to aa's lower legs BID until clear. Then PRN thereafter. Avoid f/g/a. 08/18/20   Deirdre Evener, MD  cyclobenzaprine (FLEXERIL) 5 MG tablet TAKE 1-2 TABLETS BY MOUTH 3 TIMES DAILY AS NEEDED FOR MUSCLE SPASMS. 02/04/23   Ostwalt, Edmon Crape, PA-C  Dulaglutide (TRULICITY) 3 MG/0.5ML SOPN Inject 3 mg as directed once a week. Patient receives through [facility] through Dec 2022 05/19/21   Malva Limes, MD  glipiZIDE (GLUCOTROL XL) 2.5 MG 24 hr tablet TAKE 1 TABLET BY MOUTH EVERY DAY WITH BREAKFAST 05/09/23   Malva Limes, MD  lovastatin (MEVACOR) 20 MG tablet TAKE 1 TABLET BY MOUTH (20 MG TOTAL) EVERY EVENING 12/15/22   Malva Limes, MD  meclizine (ANTIVERT) 12.5 MG tablet TAKE 1 TABLET (12.5 MG TOTAL) BY MOUTH 2 (TWO) TIMES DAILY AS NEEDED FOR DIZZINESS. 01/26/22   Malva Limes, MD  mupirocin cream (BACTROBAN) 2 % Apply 1 application topically 2 (two) times daily. 06/26/21   Malva Limes, MD  naproxen (NAPROSYN) 500 MG tablet TAKE 1  TABLET BY MOUTH 2 TIMES DAILY WITH A MEAL. 02/14/23   Malva Limes, MD  neomycin-polymyxin-hydrocortisone (CORTISPORIN) OTIC solution Place 3 drops into both ears 2 (two) times daily as needed (itching). 12/02/21   Malva Limes, MD  triamcinolone cream (KENALOG) 0.5 % Apply 1 application topically 2 (two) times daily. 04/22/20   Malva Limes, MD  TRUE METRIX BLOOD GLUCOSE TEST test strip TEST BLOOD SUGAR EVERY DAY 03/01/22   Malva Limes, MD  TRUEplus Lancets 33G MISC TEST BLOOD SUGAR EVERY DAY 03/01/22   Malva Limes, MD  verapamil (CALAN-SR) 240 MG CR tablet Take 1 tablet (240 mg total) by mouth at bedtime.  05/03/23   Malva Limes, MD     Family History  Problem Relation Age of Onset   Breast cancer Mother    Breast cancer Sister    Cancer Sister        renal     Social History   Socioeconomic History   Marital status: Widowed    Spouse name: Not on file   Number of children: 2   Years of education: Not on file   Highest education level: Some college, no degree  Occupational History   Occupation: retired  Tobacco Use   Smoking status: Former    Current packs/day: 1.00    Average packs/day: 1 pack/day for 4.0 years (4.0 ttl pk-yrs)    Types: Cigarettes   Smokeless tobacco: Never   Tobacco comments:    quit in 1991  Vaping Use   Vaping status: Never Used  Substance and Sexual Activity   Alcohol use: No   Drug use: No   Sexual activity: Yes    Birth control/protection: Post-menopausal  Other Topics Concern   Not on file  Social History Narrative   Not on file   Social Determinants of Health   Financial Resource Strain: Low Risk  (10/05/2022)   Overall Financial Resource Strain (CARDIA)    Difficulty of Paying Living Expenses: Not hard at all  Food Insecurity: No Food Insecurity (10/05/2022)   Hunger Vital Sign    Worried About Running Out of Food in the Last Year: Never true    Ran Out of Food in the Last Year: Never true  Transportation Needs: No Transportation Needs (10/05/2022)   PRAPARE - Administrator, Civil Service (Medical): No    Lack of Transportation (Non-Medical): No  Physical Activity: Inactive (10/05/2022)   Exercise Vital Sign    Days of Exercise per Week: 0 days    Minutes of Exercise per Session: 0 min  Stress: No Stress Concern Present (10/05/2022)   [employer] of Occupational Health - Occupational Stress Questionnaire    Feeling of Stress : Not at all  Social Connections: Socially Isolated (10/05/2022)   Social Connection and Isolation Panel [NHANES]    Frequency of Communication with Friends and Family: More than three times  a week    Frequency of Social Gatherings with Friends and Family: Three times a week    Attends Religious Services: Never    Active Member of Clubs or Organizations: No    Attends Banker Meetings: Never    Marital Status: Widowed    Review of Systems  Review of Systems: A 12 point ROS discussed and pertinent positives are indicated in the HPI above.  All other systems are negative.  Advance Care Plan: The advanced care plan/surrogate decision maker was discussed at the time of visit and the  patient did not wish to discuss or was not able to name a surrogate decision maker or provide an advance care plan.   Physical Exam No direct physical exam was performed (except for noted visual exam findings with Video Visits).    Vital Signs: There were no vitals taken for this visit.  Imaging: MR ABDOMEN W WO CONTRAST  Result Date: 05/19/2023 CLINICAL DATA:  Follow-up Bosniak category III right renal lesion EXAM: MRI ABDOMEN WITHOUT AND WITH CONTRAST TECHNIQUE: Multiplanar multisequence MR imaging of the abdomen was performed both before and after the administration of intravenous contrast. CONTRAST:  7 mL Vueway gadolinium contrast IV COMPARISON:  10/13/2022, 10/17/2020 FINDINGS: Lower chest: No acute abnormality. Hepatobiliary: Mildly coarse contour of the liver. Hepatic steatosis. No focal liver lesions. No gallstones, gallbladder wall thickening, or biliary dilatation. Pancreas: Unremarkable. No pancreatic ductal dilatation or surrounding inflammatory changes. Spleen: Normal in size without significant abnormality. Adrenals/Urinary Tract: Adrenal glands are unremarkable. Complex cystic lesion of the anterior midportion of the right kidney is at most minimally enlarged, measuring 2.9 x 2.3 cm, previously 2.6 x 2.3 cm, and again with intrinsically T1 hyperintense hemorrhagic or proteinaceous contents as well as multiple enhancing internal septations and small nodular components (series  13, image 56). Multiple additional benign simple and hemorrhagic or proteinaceous bilateral renal cortical cysts, for which no specific further follow-up or characterization is required. Kidneys are normal, without obvious renal calculi, solid lesion, or hydronephrosis. Stomach/Bowel: Stomach is within normal limits. No evidence of bowel wall thickening, distention, or inflammatory changes. Descending colonic diverticulosis. Vascular/Lymphatic: Aortic atherosclerosis. No enlarged abdominal lymph nodes. Other: No abdominal wall hernia or abnormality. No ascites. Musculoskeletal: No acute or significant osseous findings. IMPRESSION: 1. Complex cystic lesion of the anterior midportion of the right kidney is at most minimally enlarged, measuring 2.9 x 2.3 cm, previously 2.6 x 2.3 cm, and again with intrinsically T1 hyperintense hemorrhagic or proteinaceous contents as well as multiple enhancing internal septations and small nodular components. 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 2. No evidence of lymphadenopathy or metastatic disease in the abdomen. 3. Mildly coarse contour of the liver, suggesting cirrhosis. No suspicious liver lesions. 4. Hepatic steatosis. 5. Colonic diverticulosis. Aortic Atherosclerosis (ICD10-I70.0). Electronically Signed   By: Jearld Lesch M.D.   On: 05/19/2023 13:57   DG Lumbar Spine Complete  Result Date: 05/09/2023 CLINICAL DATA:  Low back pain for several months, initial encounter EXAM: LUMBAR SPINE - COMPLETE 4+ VIEW COMPARISON:  None Available. FINDINGS: Five lumbar type vertebral bodies are well visualized. The fifth lumbar vertebra is partially sacralized. No pars defects are noted. No anterolisthesis is seen. Vertebral body height is well maintained with the exception of T12 which demonstrates mild superior compression deformity. This is of uncertain chronicity. No soft tissue abnormality is  noted. IMPRESSION: T12 compression deformity of uncertain chronicity but new from 11/01/2022. Correlate to point tenderness. Electronically Signed   By: Alcide Clever M.D.   On: 05/09/2023 01:50   DG Cervical Spine Complete  Result Date: 05/09/2023 CLINICAL DATA:  Neck pain radiating to the left, initial encounter EXAM: CERVICAL SPINE - COMPLETE 4+ VIEW COMPARISON:  None Available. FINDINGS: Seven cervical segments are well visualized. Mild osteophytic changes are noted. No anterolisthesis is seen. Neural foramina are widely patent bilaterally. Bilateral carotid calcifications are seen. The odontoid is within normal limits. IMPRESSION: Mild degenerative change without acute abnormality. Electronically Signed   By: Loraine Leriche  Lukens M.D.   On: 05/09/2023 01:44    Labs:  CBC: Recent Labs    12/20/22 0928  WBC 7.8  HGB 13.0  HCT 39.9  PLT 227    COAGS: No results for input(s): "INR", "APTT" in the last 8760 hours.  BMP: Recent Labs    12/20/22 0928  NA 139  K 4.6  CL 101  CO2 21  GLUCOSE 135*  BUN 28*  CALCIUM 9.7  CREATININE 2.65*    LIVER FUNCTION TESTS: Recent Labs    12/20/22 0928  BILITOT 0.3  AST 30  ALT 23  ALKPHOS 89  PROT 7.3  ALBUMIN 4.4    TUMOR MARKERS: No results for input(s): "AFPTM", "CEA", "CA199", "CHROMGRNA" in the last 8760 hours.  Assessment and Plan:  72 year old woman with history Bosniak 3 right renal mass return to IR clinic after completion of follow up MRI.  The most recent MRI shows interval growth with the lesion now measuring 2.9 x 2.3 cm compared to 2.3 x 2.0 cm on 10/17/2020.  This remains suspicious for a slow growing cystic neoplasm.  Unfortunately, given its anterior location, this lesion remains a poor candidate for percutaneous ablation.  The risk of hemorrhage and injury to the duodenum is too high.  We had previously discussed the steps of microwave and cryoablation.  I again explained to her why her particular lesion is too high risk  for percutaneous ablation.  She understood the risks.  She did not have any additional questions.  Thank you for this interesting consult.  I greatly enjoyed meeting Anna Sweeney and look forward to participating in their care.  A copy of this report was sent to the requesting provider on this date.  Electronically Signed: Al Corp[place] Barman 05/20/2023, 3:29 PM   I spent a total of   25 Minutes in remote  clinical consultation, greater than 50% of which was counseling/coordinating care for Bosniak 3 right renal mass.    Visit type: Audio only (telephone). Audio (no video) only due to technical limitations. Alternative for in-person consultation at [facility], [address] [place], [place], [place]. This visit type was conducted due to national recommendations for restrictions regarding the COVID-19 Pandemic (e.g. social distancing).  This format is felt to be most appropriate for this patient at this time.  All issues noted in this document were discussed and addressed.

## 2023-05-25 ENCOUNTER — Other Ambulatory Visit: Payer: Self-pay | Admitting: Pharmacist

## 2023-05-25 ENCOUNTER — Encounter: Payer: Self-pay | Admitting: Pharmacist

## 2023-05-25 NOTE — Progress Notes (Signed)
05/25/2023 Name: Anna Sweeney MRN: [id number] DOB: May 10, 1951  Chief Complaint  Patient presents with   Diabetes   Hypertension   Medication Management    Anna Sweeney is a 72 y.o. year old female who presented for a telephone visit.   They were referred to the pharmacist by  [facility] prior  for assistance in managing diabetes and medication access.    Subjective:  Care Team: Primary Care Provider: Malva Limes, MD ; Next Scheduled Visit: 06/20/23 Clinical Pharmacist: Marlowe Aschoff, PharmD  Medication Access/Adherence  Current Pharmacy:  [facility] - [place], [place] - [facility] AT Portal to Registered [facility] [address] [place] [address] Phone: [phone] Fax: [phone]  [id number] [place], [place] - [address] [place] [address] Phone: [phone] Fax: [phone]   Patient reports affordability concerns with their medications: No  Patient reports access/transportation concerns to their pharmacy: No  Patient reports adherence concerns with their medications:  No     Diabetes:  Current medications: Trulicity 3mg /0.52mL, Glipizide XL 2.5mg  Medications prior: CANNOT do anything else besides increase Trulicity 4.5mg /mL due to eGFR of 17  Freestyle Libre 3 Reader: Average Glucose: 111 mg/dL Time in Goal:  - Time in range 70-180: 88% - Time above range: 7% - Time below range: 5% Observed patterns: 2 low BG readings (60's) during her sleep in early morning due to not eating snack before bedtime; snack is usually pineapple, PB, apple w/ almond butter; corrects low BG with candies on nightstand appropriately  Patient denies hypoglycemic s/sx including dizziness, shakiness, sweating. Patient denies hyperglycemic symptoms including polyuria, polydipsia, polyphagia, nocturia, neuropathy, blurred vision.   Current medication access support: [employer]  Advantage  Hypertension:  Current medications: Amlodipine 5mg , Verapamil 240mg  CR (SVT history)   Patient has a validated, automated, upper arm home BP cuff Current blood pressure readings readings: Left 124/60; 138/74 in right arm  Patient denies hypotensive s/sx including dizziness, lightheadedness.  Patient denies hypertensive symptoms including headache, chest pain, shortness of breath  Osteoporosis:  Current medications: None- advised to stop Alendronate by Dr. Thedore Mins (nephrology)   Objective:  Lab Results  Component Value Date   HGBA1C 7.8 (A) 12/15/2022    Lab Results  Component Value Date   CREATININE 2.65 (H) 12/20/2022   BUN 28 (H) 12/20/2022   NA 139 12/20/2022   K 4.6 12/20/2022   CL 101 12/20/2022   CO2 21 12/20/2022    Lab Results  Component Value Date   CHOL 165 12/20/2022   HDL 42 12/20/2022   LDLCALC 82 12/20/2022   TRIG 245 (H) 12/20/2022   CHOLHDL 3.9 12/20/2022    Medications Reviewed Today     Reviewed by Pollie Friar, RPH (Pharmacist) on 05/25/23 at 1149  Med List Status: <None>   Medication Order Taking? Sig Documenting Provider Last Dose Status Informant  Alcohol Swabs (DROPSAFE ALCOHOL PREP) 70 % PADS [id number] Yes USE TO CHECK BLOOD SUGAR EVERY DAY. Malva Limes, MD Taking Active   alendronate (FOSAMAX) 70 MG tablet [id number] No Take 1 tablet (70 mg total) by mouth every 7 (seven) days. Take with a full glass of water on an empty stomach.  Patient not taking: Reported on 05/25/2023   Malva Limes, MD Not Taking Active            Med Note Lorenso Courier, Vanessa Barbara May 25, 2023 11:11 AM) Not recommended by Dr.  Singh  amLODipine (NORVASC) 5 MG tablet [id number] Yes Take 1 tablet (5 mg total) by mouth daily. Malva Limes, MD Taking Active   aspirin EC 81 MG tablet [id number] Yes Take 81 mg by mouth daily. [provider] Taking Active Self  betamethasone dipropionate (DIPROLENE) 0.05 % cream [id number] Yes Apply topically  2 (two) times daily as needed. Malva Limes, MD Taking Active   Blood Glucose Monitoring Suppl (TRUE METRIX AIR GLUCOSE METER) w/Device Andria Rhein [id number] Yes USE AS DIRECTED Malva Limes, MD Taking Active   Cholecalciferol (VITAMIN D-3) 125 MCG (5000 UT) TABS [id number] Yes Take 5,000 Units by mouth daily. [provider] Taking Active   Clobetasol Prop Emollient Base (CLOBETASOL PROPIONATE E) 0.05 % emollient cream [id number] Yes Apply to aa's lower legs BID until clear. Then PRN thereafter. Avoid f/g/a. Deirdre Evener, MD Taking Active   Continuous Glucose Sensor (FREESTYLE LIBRE 3 [place]) [place] [id number] Yes 1 each by Does not apply route every 14 (fourteen) days. [provider] Taking Active            Med Note Lorenso Courier, Vanessa Barbara May 25, 2023 11:48 AM) Uses a reader  cyclobenzaprine (FLEXERIL) 5 MG tablet [id number] Yes TAKE 1-2 TABLETS BY MOUTH 3 TIMES DAILY AS NEEDED FOR MUSCLE SPASMS. Debera Lat, PA-C Taking Active   Dulaglutide (TRULICITY) 3 MG/0.5ML SOPN [id number] Yes Inject 3 mg as directed once a week. Patient receives through [facility] through Dec 2022 Malva Limes, MD Taking Active   glipiZIDE (GLUCOTROL XL) 2.5 MG 24 hr tablet [id number] Yes TAKE 1 TABLET BY MOUTH EVERY DAY WITH Delman Kitten, MD Taking Active   lovastatin (MEVACOR) 20 MG tablet [id number] Yes TAKE 1 TABLET BY MOUTH (20 MG TOTAL) EVERY EVENING Malva Limes, MD Taking Active   meclizine (ANTIVERT) 12.5 MG tablet [id number] Yes TAKE 1 TABLET (12.5 MG TOTAL) BY MOUTH 2 (TWO) TIMES DAILY AS NEEDED FOR DIZZINESS. Malva Limes, MD Taking Active   mupirocin cream (BACTROBAN) 2 % [id number] Yes Apply 1 application topically 2 (two) times daily. Malva Limes, MD Taking Active   naproxen (NAPROSYN) 500 MG tablet [id number] Yes TAKE 1 TABLET BY MOUTH 2 TIMES DAILY WITH A MEAL. Malva Limes, MD Taking Active   neomycin-polymyxin-hydrocortisone [facility]) OTIC  solution [id number] Yes Place 3 drops into both ears 2 (two) times daily as needed (itching). Malva Limes, MD Taking Active   triamcinolone cream (KENALOG) 0.5 % [id number] Yes Apply 1 application topically 2 (two) times daily. Malva Limes, MD Taking Active            Med Note Elnora Morrison, [facility]   Tue Sep 23, 2020 10:22 AM) As needed  TRUE METRIX BLOOD GLUCOSE TEST test strip [id number] Yes TEST BLOOD SUGAR EVERY DAY Malva Limes, MD Taking Active   TRUEplus Lancets 33G MISC [id number] Yes TEST BLOOD SUGAR EVERY DAY Malva Limes, MD Taking Active   verapamil (CALAN-SR) 240 MG CR tablet [id number] Yes Take 1 tablet (240 mg total) by mouth at bedtime. Malva Limes, MD Taking Active               Assessment/Plan:   Diabetes: - Currently controlled - Reviewed long term cardiovascular and renal outcomes of uncontrolled blood sugar - Reviewed goal A1c, goal fasting, and goal 2 hour post prandial glucose - Recommend to continue using Freestyle Libre 3 to track readings     Hypertension: -  Currently controlled - Reviewed long term cardiovascular and renal outcomes of uncontrolled blood pressure - Reviewed appropriate blood pressure monitoring technique and reviewed goal blood pressure. Recommended to check home blood pressure and heart rate daily    Osteoporosis: - Currently uncontrolled - Has mild degenerative changes in spine with 5th lumbar partially sacralized and T12 compression deformity;  osteopenia progression since last bone scan in 2019 - Extensive discussion about what imaging meant and recommendations for Prolia with any concerns    **Follow Up Plan:**  - NO tax forms- will provide screenshot of bank statement for social security monthly statement (proof of income) - Mail Trulicity PAP form to the patient- will drop off to the office when completed - Interest in starting process for Prolia as recommended by Dr. Thedore Mins- may need to discuss with him  regarding this concern - PCP has already sent to osteoporosis team and working on finding an appropriate clinic/facility for infusion  Marlowe Aschoff, PharmD [facility] Phone Number: [phone]

## 2023-06-01 DIAGNOSIS — I10 Essential (primary) hypertension: Secondary | ICD-10-CM | POA: Diagnosis not present

## 2023-06-01 DIAGNOSIS — N281 Cyst of kidney, acquired: Secondary | ICD-10-CM | POA: Diagnosis not present

## 2023-06-01 DIAGNOSIS — N184 Chronic kidney disease, stage 4 (severe): Secondary | ICD-10-CM | POA: Diagnosis not present

## 2023-06-01 DIAGNOSIS — E1122 Type 2 diabetes mellitus with diabetic chronic kidney disease: Secondary | ICD-10-CM | POA: Diagnosis not present

## 2023-06-03 DIAGNOSIS — M25512 Pain in left shoulder: Secondary | ICD-10-CM | POA: Diagnosis not present

## 2023-06-06 DIAGNOSIS — N184 Chronic kidney disease, stage 4 (severe): Secondary | ICD-10-CM | POA: Diagnosis not present

## 2023-06-06 DIAGNOSIS — I10 Essential (primary) hypertension: Secondary | ICD-10-CM | POA: Diagnosis not present

## 2023-06-06 DIAGNOSIS — N281 Cyst of kidney, acquired: Secondary | ICD-10-CM | POA: Diagnosis not present

## 2023-06-06 DIAGNOSIS — E1122 Type 2 diabetes mellitus with diabetic chronic kidney disease: Secondary | ICD-10-CM | POA: Diagnosis not present

## 2023-06-10 ENCOUNTER — Telehealth: Payer: Self-pay

## 2023-06-10 NOTE — Telephone Encounter (Signed)
In progress of mailing [employer] app to patients home for Trulicity re-enrollment.

## 2023-06-10 NOTE — Telephone Encounter (Signed)
-----   Message from Marjory Lies Powers sent at 05/27/2023  9:35 PM EDT ----- Please mail out Trulicity PAP application to the patient for re-enrollment with [facility]. She will drop it off at the office with proof of income documents once completed.

## 2023-06-14 DIAGNOSIS — H401221 Low-tension glaucoma, left eye, mild stage: Secondary | ICD-10-CM | POA: Diagnosis not present

## 2023-06-14 DIAGNOSIS — H2513 Age-related nuclear cataract, bilateral: Secondary | ICD-10-CM | POA: Diagnosis not present

## 2023-06-14 NOTE — Telephone Encounter (Signed)
Application placed in the mail.

## 2023-06-20 ENCOUNTER — Ambulatory Visit: Payer: No Typology Code available for payment source | Admitting: Family Medicine

## 2023-06-20 ENCOUNTER — Encounter: Payer: Self-pay | Admitting: Family Medicine

## 2023-06-20 VITALS — BP 140/72 | HR 74 | Ht 67.0 in | Wt 150.0 lb

## 2023-06-20 DIAGNOSIS — E1121 Type 2 diabetes mellitus with diabetic nephropathy: Secondary | ICD-10-CM

## 2023-06-20 DIAGNOSIS — L304 Erythema intertrigo: Secondary | ICD-10-CM

## 2023-06-20 LAB — POCT GLYCOSYLATED HEMOGLOBIN (HGB A1C): Hemoglobin A1C: 7.2 % — AB (ref 4.0–5.6)

## 2023-06-20 MED ORDER — KETOCONAZOLE 2 % EX CREA: 1.0000 | TOPICAL_CREAM | Freq: Two times a day (BID) | CUTANEOUS | 0 refills | Status: AC

## 2023-06-20 NOTE — Patient Instructions (Signed)
.   Please review the attached list of medications and notify my office if there are any errors.   . Please bring all of your medications to every appointment so we can make sure that our medication list is the same as yours.   

## 2023-06-20 NOTE — Progress Notes (Signed)
Established patient visit   Patient: Anna Sweeney   DOB: May 17, 1951   72 y.o. Female  MRN: [id number] Visit Date: 06/20/2023  Today's healthcare provider: Mila Merry, MD   No chief complaint on file.  Subjective    Discussed the use of AI scribe software for clinical note transcription with the patient, who gave verbal consent to proceed.  History of Present Illness   Anna Sweeney, a patient with a history of diabetes, hypertension, and hyperlipidemia, presents for a routine follow-up visit. Her last A1c in May was 7.8%. Remains on Trulicity 3mg  and glipizide. She denies any issues with this medication.  She also reports good control of her blood pressure, which she monitors at home. However, she notes a discrepancy in readings when taken in a seated versus standing position. Despite this, all readings remain within a normal range. She is currently on amlodipine for BP and verapamil SVT reports no issues with these medications.  In addition to her chronic conditions, the patient reports a recent issue with her left armpit. She describes an irritation and redness that has been present for about a week. She has been avoiding the use of deodorant due to fear of exacerbating the irritation.       Medications: Outpatient Medications Prior to Visit  Medication Sig   Alcohol Swabs (DROPSAFE ALCOHOL PREP) 70 % PADS USE TO CHECK BLOOD SUGAR EVERY DAY.   alendronate (FOSAMAX) 70 MG tablet Take 1 tablet (70 mg total) by mouth every 7 (seven) days. Take with a full glass of water on an empty stomach.   amLODipine (NORVASC) 5 MG tablet Take 1 tablet (5 mg total) by mouth daily.   aspirin EC 81 MG tablet Take 81 mg by mouth daily.   betamethasone dipropionate (DIPROLENE) 0.05 % cream Apply topically 2 (two) times daily as needed.   Blood Glucose Monitoring Suppl (TRUE METRIX AIR GLUCOSE METER) w/Device KIT USE AS DIRECTED   Cholecalciferol (VITAMIN D-3) 125 MCG (5000 UT) TABS Take 5,000  Units by mouth daily.   Clobetasol Prop Emollient Base (CLOBETASOL PROPIONATE E) 0.05 % emollient cream Apply to aa's lower legs BID until clear. Then PRN thereafter. Avoid f/g/a.   Continuous Glucose Sensor (FREESTYLE LIBRE 3 SENSOR) MISC 1 each by Does not apply route every 14 (fourteen) days.   cyclobenzaprine (FLEXERIL) 5 MG tablet TAKE 1-2 TABLETS BY MOUTH 3 TIMES DAILY AS NEEDED FOR MUSCLE SPASMS.   Dulaglutide (TRULICITY) 3 MG/0.5ML SOPN Inject 3 mg as directed once a week. Patient receives through [facility] through Dec 2022   glipiZIDE (GLUCOTROL XL) 2.5 MG 24 hr tablet TAKE 1 TABLET BY MOUTH EVERY DAY WITH BREAKFAST   lovastatin (MEVACOR) 20 MG tablet TAKE 1 TABLET BY MOUTH (20 MG TOTAL) EVERY EVENING   meclizine (ANTIVERT) 12.5 MG tablet TAKE 1 TABLET (12.5 MG TOTAL) BY MOUTH 2 (TWO) TIMES DAILY AS NEEDED FOR DIZZINESS.   mupirocin cream (BACTROBAN) 2 % Apply 1 application topically 2 (two) times daily.   naproxen (NAPROSYN) 500 MG tablet TAKE 1 TABLET BY MOUTH 2 TIMES DAILY WITH A MEAL.   neomycin-polymyxin-hydrocortisone (CORTISPORIN) OTIC solution Place 3 drops into both ears 2 (two) times daily as needed (itching).   triamcinolone cream (KENALOG) 0.5 % Apply 1 application topically 2 (two) times daily.   TRUE METRIX BLOOD GLUCOSE TEST test strip TEST BLOOD SUGAR EVERY DAY   TRUEplus Lancets 33G MISC TEST BLOOD SUGAR EVERY DAY   verapamil (CALAN-SR) 240 MG  CR tablet Take 1 tablet (240 mg total) by mouth at bedtime.   No facility-administered medications prior to visit.   Review of Systems     Objective    BP (!) 140/72   Pulse 74   Ht 5\' 7"  (1.702 m)   Wt 150 lb (68 kg)   SpO2 100%   BMI 23.49 kg/m    Physical Exam   VITALS: BP- 137/77 sitting, 124/64 standing MEASUREMENTS: WT- 150 CHEST: Lungs clear to auscultation. SKIN: Erythema and irritation left axilla. no discharge     Results for orders placed or performed in visit on 06/20/23  POCT HgB A1C   Result Value Ref Range   Hemoglobin A1C 7.2 (A) 4.0 - 5.6 %    Assessment & Plan        Type 2 Diabetes Mellitus Improved glycemic control with A1c of 7.2. Currently on Trulicity 3mg  weekly and glipizide. -Continue Trulicity 3mg  weekly. -Consider reducing dose or discontinuing one of the diabetic medications if A1c continues to decrease and weight loss continues.  Hypertension Stable blood pressure readings at home with some variability. Currently on Amlodipine and Verapamil. -Continue Amlodipine and Verapamil, which was prescribed to control SVT.   History SVT controlled in current dose of verapamil   Intertrigo left axillae -Prescribe Ketoconazole cream for topical application.  Follow up 4 months    Return in about 4 months (around 10/18/2023) for Diabetes, Hypertension.      Mila Merry, MD  [facility] [phone] (phone) [phone] (fax)  [facility]

## 2023-06-24 DIAGNOSIS — M25512 Pain in left shoulder: Secondary | ICD-10-CM | POA: Diagnosis not present

## 2023-07-06 ENCOUNTER — Telehealth: Payer: Self-pay | Admitting: Pharmacist

## 2023-07-06 DIAGNOSIS — E1122 Type 2 diabetes mellitus with diabetic chronic kidney disease: Secondary | ICD-10-CM

## 2023-07-06 NOTE — Progress Notes (Signed)
   07/06/2023  Patient ID: Anna Sweeney, female   DOB: 03/08/1951, 72 y.o.   MRN: [id number]  Received fax from Dr. Sherrie Mustache with patient's application. Called the patient and advised we still need proof of income attached to submit the application. Unable to reach so I left voicemail expressing this concern. Requested call back at earliest convenience.    Marlowe Aschoff, PharmD [facility] Phone Number: [phone]

## 2023-07-11 ENCOUNTER — Other Ambulatory Visit: Payer: Self-pay | Admitting: *Deleted

## 2023-07-11 DIAGNOSIS — N281 Cyst of kidney, acquired: Secondary | ICD-10-CM

## 2023-07-11 MED ORDER — TRULICITY 3 MG/0.5ML ~~LOC~~ SOAJ: 3.0000 mg | SUBCUTANEOUS | 4 refills | Status: AC

## 2023-07-11 NOTE — Addendum Note (Signed)
Addended by: Pollie Friar on: 07/11/2023 11:27 AM   Modules accepted: Orders

## 2023-07-12 DIAGNOSIS — M25512 Pain in left shoulder: Secondary | ICD-10-CM | POA: Diagnosis not present

## 2023-07-13 ENCOUNTER — Telehealth: Payer: Self-pay

## 2023-07-13 NOTE — Telephone Encounter (Signed)
Good morning, did you by chance call her? Thanks [place]    Copied from [employer] [id number]. Topic: General - Call Back - No Documentation >> Jul 13, 2023  9:12 AM Macon Large wrote: Reason for CRM: Pt stated that she received a call regarding her application for medication that she gets from [place]. Pt reports that the message that she received stated that there was a problem with some of the questions on the application and to call back to discuss. Cb# [phone]

## 2023-07-18 ENCOUNTER — Ambulatory Visit: Admission: RE | Admit: 2023-07-18 | Payer: No Typology Code available for payment source | Source: Ambulatory Visit

## 2023-07-18 NOTE — Addendum Note (Signed)
Addended by: Levada Schilling on: 07/18/2023 01:58 PM   Modules accepted: Orders

## 2023-07-22 DIAGNOSIS — M7542 Impingement syndrome of left shoulder: Secondary | ICD-10-CM | POA: Diagnosis not present

## 2023-07-22 DIAGNOSIS — M7502 Adhesive capsulitis of left shoulder: Secondary | ICD-10-CM | POA: Diagnosis not present

## 2023-07-22 DIAGNOSIS — E119 Type 2 diabetes mellitus without complications: Secondary | ICD-10-CM | POA: Diagnosis not present

## 2023-07-25 DIAGNOSIS — M25512 Pain in left shoulder: Secondary | ICD-10-CM | POA: Diagnosis not present

## 2023-08-05 DIAGNOSIS — M25512 Pain in left shoulder: Secondary | ICD-10-CM | POA: Diagnosis not present

## 2023-08-08 ENCOUNTER — Encounter: Payer: Self-pay | Admitting: Urology

## 2023-08-11 ENCOUNTER — Other Ambulatory Visit: Payer: No Typology Code available for payment source

## 2023-08-24 ENCOUNTER — Other Ambulatory Visit: Payer: Self-pay | Admitting: Pharmacist

## 2023-08-24 DIAGNOSIS — R2681 Unsteadiness on feet: Secondary | ICD-10-CM | POA: Diagnosis not present

## 2023-08-24 DIAGNOSIS — M810 Age-related osteoporosis without current pathological fracture: Secondary | ICD-10-CM

## 2023-08-24 DIAGNOSIS — Z008 Encounter for other general examination: Secondary | ICD-10-CM | POA: Diagnosis not present

## 2023-08-24 DIAGNOSIS — I129 Hypertensive chronic kidney disease with stage 1 through stage 4 chronic kidney disease, or unspecified chronic kidney disease: Secondary | ICD-10-CM | POA: Diagnosis not present

## 2023-08-24 DIAGNOSIS — E1122 Type 2 diabetes mellitus with diabetic chronic kidney disease: Secondary | ICD-10-CM | POA: Diagnosis not present

## 2023-08-24 DIAGNOSIS — N184 Chronic kidney disease, stage 4 (severe): Secondary | ICD-10-CM | POA: Diagnosis not present

## 2023-08-24 DIAGNOSIS — H4010X0 Unspecified open-angle glaucoma, stage unspecified: Secondary | ICD-10-CM | POA: Diagnosis not present

## 2023-08-24 DIAGNOSIS — E1169 Type 2 diabetes mellitus with other specified complication: Secondary | ICD-10-CM | POA: Diagnosis not present

## 2023-08-24 DIAGNOSIS — E785 Hyperlipidemia, unspecified: Secondary | ICD-10-CM | POA: Diagnosis not present

## 2023-08-24 NOTE — Progress Notes (Signed)
08/24/2023 Name: Anna Sweeney MRN: [id number] DOB: [birth year]  Chief Complaint  Patient presents with   Diabetes    Anna Sweeney is a 73 y.o. year old female who presented for a telephone visit.   They were referred to the pharmacist by  [facility] prior  for assistance in managing diabetes and medication access.    Subjective:  Care Team: Primary Care Provider: Malva Limes, MD ; Next Scheduled Visit: 06/20/23 Clinical Pharmacist: Marlowe Aschoff, PharmD  Medication Access/Adherence  Current Pharmacy:  [facility] - [place], [place] - [address] [facility] AT Portal to Registered [facility] [address] [place] [address] Phone: [phone] Fax: [phone]  [id number] [place], [place] - [address] [place] [address] Phone: [phone] Fax: [phone]  [facility] - [place], [place] - [address] [place] [address] Phone: [phone] Fax: [phone]   Patient reports affordability concerns with their medications: No  Patient reports access/transportation concerns to their pharmacy: No  Patient reports adherence concerns with their medications:  No     Diabetes:  Current medications: Trulicity 3mg /0.48mL, Glipizide XL 2.5mg  Medications prior: CANNOT do anything else besides increase Trulicity 4.5mg /mL due to eGFR of 17  Freestyle Libre 3 Reader: Average Glucose: 111 mg/dL Time in Goal:  - Time in range 70-180: 88% - Time above range: 7% - Time below range: 5% Observed patterns: 2 low BG readings (60's) during her sleep in early morning due to not eating snack before bedtime; snack is usually pineapple, PB, apple w/ almond butter; corrects low BG with candies on nightstand appropriately  Patient denies hypoglycemic s/sx including dizziness, shakiness, sweating. Patient denies hyperglycemic symptoms including polyuria, polydipsia, polyphagia,  nocturia, neuropathy, blurred vision.   Current medication access support: [employer]  Hypertension:  Current medications: Amlodipine 5mg , Verapamil 240mg  CR (SVT history)   Patient has a validated, automated, upper arm home BP cuff Current blood pressure readings readings: Left 124/60; 138/74 in right arm  Patient denies hypotensive s/sx including dizziness, lightheadedness.  Patient denies hypertensive symptoms including headache, chest pain, shortness of breath  Osteoporosis:  Current medications: None- advised to stop Alendronate by Dr. Thedore Mins (nephrology)   Objective:  Lab Results  Component Value Date   HGBA1C 7.2 (A) 06/20/2023    Lab Results  Component Value Date   CREATININE 2.65 (H) 12/20/2022   BUN 28 (H) 12/20/2022   NA 139 12/20/2022   K 4.6 12/20/2022   CL 101 12/20/2022   CO2 21 12/20/2022    Lab Results  Component Value Date   CHOL 165 12/20/2022   HDL 42 12/20/2022   LDLCALC 82 12/20/2022   TRIG 245 (H) 12/20/2022   CHOLHDL 3.9 12/20/2022    Medications Reviewed Today   Medications were not reviewed in this encounter       Assessment/Plan:   Diabetes: - Currently controlled - Reviewed long term cardiovascular and renal outcomes of uncontrolled blood sugar - Reviewed goal A1c, goal fasting, and goal 2 hour post prandial glucose - Recommend to continue using Freestyle Libre 3 to track readings     Hypertension: - Currently controlled - Reviewed long term cardiovascular and renal outcomes of uncontrolled blood pressure - Reviewed appropriate blood pressure monitoring technique and reviewed goal blood pressure. 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 Thedore Mins due to waiting for stable eGFR and level >20 and not having high microalbuminuria currently - Steroid injection on 07/12/23, so concerns that BG may be higher- will continue to monitor - Prolia initiation has not been started since last message to Dr. Sherrie Mustache- will notify again - Reports continued weight loss with Trulicity- note from Dr. Sherrie Mustache about discontinuation in the future if BMI gets too low   Marlowe Aschoff, PharmD [facility] Phone Number: [phone]

## 2023-08-25 ENCOUNTER — Ambulatory Visit
Admission: RE | Admit: 2023-08-25 | Discharge: 2023-08-25 | Disposition: A | Discharge status: Home / Self Care | Payer: No Typology Code available for payment source | Source: Ambulatory Visit | Attending: Urology | Admitting: Urology

## 2023-08-25 DIAGNOSIS — K769 Liver disease, unspecified: Secondary | ICD-10-CM | POA: Diagnosis not present

## 2023-08-25 DIAGNOSIS — N281 Cyst of kidney, acquired: Secondary | ICD-10-CM | POA: Diagnosis not present

## 2023-08-25 DIAGNOSIS — K5730 Diverticulosis of large intestine without perforation or abscess without bleeding: Secondary | ICD-10-CM | POA: Diagnosis not present

## 2023-08-25 DIAGNOSIS — I700 Atherosclerosis of aorta: Secondary | ICD-10-CM | POA: Diagnosis not present

## 2023-08-25 MED ORDER — GADOPICLENOL 0.5 MMOL/ML IV SOLN
7.0000 mL | Freq: Once | INTRAVENOUS | Status: AC | PRN
  Administered 2023-08-25: 7 mL via INTRAVENOUS

## 2023-08-26 ENCOUNTER — Ambulatory Visit: Payer: No Typology Code available for payment source | Admitting: Family Medicine

## 2023-08-29 ENCOUNTER — Encounter: Payer: Self-pay | Admitting: Family Medicine

## 2023-08-29 ENCOUNTER — Ambulatory Visit (INDEPENDENT_AMBULATORY_CARE_PROVIDER_SITE_OTHER): Payer: No Typology Code available for payment source | Admitting: Family Medicine

## 2023-08-29 VITALS — BP 138/71 | HR 85 | Resp 16 | Ht 67.0 in | Wt 147.8 lb

## 2023-08-29 DIAGNOSIS — E1121 Type 2 diabetes mellitus with diabetic nephropathy: Secondary | ICD-10-CM

## 2023-08-29 DIAGNOSIS — N184 Chronic kidney disease, stage 4 (severe): Secondary | ICD-10-CM | POA: Diagnosis not present

## 2023-08-29 DIAGNOSIS — I10 Essential (primary) hypertension: Secondary | ICD-10-CM

## 2023-08-29 DIAGNOSIS — K760 Fatty (change of) liver, not elsewhere classified: Secondary | ICD-10-CM

## 2023-08-29 NOTE — Progress Notes (Signed)
Established patient visit   Patient: Anna Sweeney   DOB: [birth year]   73 y.o. Female  MRN: [id number] Visit Date: 08/29/2023  Today's healthcare provider: Mila Merry, MD   Chief Complaint  Patient presents with   Diabetes   Hypertension   Subjective    Discussed the use of AI scribe software for clinical note transcription with the patient, who gave verbal consent to proceed.  History of Present Illness   The patient, with a history of hypertension and diabetes, presents for a routine checkup. They report a recent increase in blood pressure readings, with values often in the 140s systolic, and a significant drop upon standing, with systolic readings in the 20s and 30s. They deny any associated dizziness or lightheadedness.  Regarding their diabetes management, they report a recent sensor failure and have not replaced it due to an upcoming MRI. Prior to the sensor failure, they report good blood sugar control. They are currently on Trulicity and glipizide, and report one episode of hypoglycemia, which they attribute to delayed mealtime.       Medications: Outpatient Medications Prior to Visit  Medication Sig   Alcohol Swabs (DROPSAFE ALCOHOL PREP) 70 % PADS USE TO CHECK BLOOD SUGAR EVERY DAY.   amLODipine (NORVASC) 5 MG tablet Take 1 tablet (5 mg total) by mouth daily.   aspirin EC 81 MG tablet Take 81 mg by mouth daily.   betamethasone dipropionate (DIPROLENE) 0.05 % cream Apply topically 2 (two) times daily as needed.   Cholecalciferol (VITAMIN D-3) 125 MCG (5000 UT) TABS Take 5,000 Units by mouth daily.   Clobetasol Prop Emollient Base (CLOBETASOL PROPIONATE E) 0.05 % emollient cream Apply to aa's lower legs BID until clear. Then PRN thereafter. Avoid f/g/a.   Continuous Glucose Sensor (FREESTYLE LIBRE 3 SENSOR) MISC 1 each by Does not apply route every 14 (fourteen) days.   cyclobenzaprine (FLEXERIL) 5 MG tablet TAKE 1-2 TABLETS BY MOUTH 3 TIMES DAILY AS NEEDED FOR  MUSCLE SPASMS.   Dulaglutide (TRULICITY) 3 MG/0.5ML SOAJ Inject 3 mg as directed once a week.   glipiZIDE (GLUCOTROL XL) 2.5 MG 24 hr tablet TAKE 1 TABLET BY MOUTH EVERY DAY WITH BREAKFAST   ketoconazole (NIZORAL) 2 % cream Apply 1 Application topically 2 (two) times daily. Until clear   lovastatin (MEVACOR) 20 MG tablet TAKE 1 TABLET BY MOUTH (20 MG TOTAL) EVERY EVENING   meclizine (ANTIVERT) 12.5 MG tablet TAKE 1 TABLET (12.5 MG TOTAL) BY MOUTH 2 (TWO) TIMES DAILY AS NEEDED FOR DIZZINESS.   mupirocin cream (BACTROBAN) 2 % Apply 1 application topically 2 (two) times daily.   naproxen (NAPROSYN) 500 MG tablet TAKE 1 TABLET BY MOUTH 2 TIMES DAILY WITH A MEAL.   neomycin-polymyxin-hydrocortisone (CORTISPORIN) OTIC solution Place 3 drops into both ears 2 (two) times daily as needed (itching).   triamcinolone cream (KENALOG) 0.5 % Apply 1 application topically 2 (two) times daily.   TRUE METRIX BLOOD GLUCOSE TEST test strip TEST BLOOD SUGAR EVERY DAY   TRUEplus Lancets 33G MISC TEST BLOOD SUGAR EVERY DAY   verapamil (CALAN-SR) 240 MG CR tablet Take 1 tablet (240 mg total) by mouth at bedtime.   Blood Glucose Monitoring Suppl (TRUE METRIX AIR GLUCOSE METER) w/Device KIT USE AS DIRECTED   No facility-administered medications prior to visit.   Review of Systems  Constitutional:  Negative for appetite change, chills, fatigue and fever.  Respiratory:  Negative for chest tightness and shortness of breath.   Cardiovascular:  Negative for chest pain and palpitations.  Gastrointestinal:  Negative for abdominal pain, nausea and vomiting.  Neurological:  Negative for dizziness and weakness.       Objective    BP 138/71 (BP Location: Left Arm, Patient Position: Sitting, Cuff Size: Normal)   Pulse 85   Resp 16   Ht 5\' 7"  (1.702 m)   Wt 147 lb 12.8 oz (67 kg)   SpO2 100%   BMI 23.15 kg/m   Physical Exam   General: Appearance:    Well developed, well nourished female in no acute distress  Eyes:     PERRL, conjunctiva/corneas clear, EOM's intact       Lungs:     Clear to auscultation bilaterally, respirations unlabored  Heart:    Normal heart rate. Normal rhythm. No murmurs, rubs, or gallops.    MS:   All extremities are intact.    Neurologic:   Awake, alert, oriented x 3. No apparent focal neurological defect.         Assessment & Plan        Hypertension Blood pressure readings at home have been elevated (up to 147/70), but significant drop in blood pressure noted when standing (120s/60s). No symptoms of dizziness reported. Concerns about increasing antihypertensive medication due to potential for orthostatic hypotension. -Continue current blood pressure management. No changes to medication at this time.  Diabetes Mellitus Patient has been monitoring blood sugar with a sensor, but it recently failed. Patient has not replaced it due to upcoming MRI and recent illness. Patient reports good control when sensor was in use. Currently on Trulicity and Glipizide. -Continue current diabetes management with Trulicity and Glipizide. -Check A1C at [facility] at patient's convenience. No fasting required.  CKD Follow up nephrology in early February as already scheduled       Mila Merry, MD  [facility] [phone] (phone) [phone] (fax)  [facility]

## 2023-09-01 DIAGNOSIS — E1121 Type 2 diabetes mellitus with diabetic nephropathy: Secondary | ICD-10-CM | POA: Diagnosis not present

## 2023-09-02 DIAGNOSIS — E119 Type 2 diabetes mellitus without complications: Secondary | ICD-10-CM | POA: Diagnosis not present

## 2023-09-02 DIAGNOSIS — M25512 Pain in left shoulder: Secondary | ICD-10-CM | POA: Diagnosis not present

## 2023-09-02 LAB — HEMOGLOBIN A1C
Est. average glucose Bld gHb Est-mCnc: 146 mg/dL
Hgb A1c MFr Bld: 6.7 % — ABNORMAL HIGH (ref 4.8–5.6)

## 2023-09-07 DIAGNOSIS — I10 Essential (primary) hypertension: Secondary | ICD-10-CM | POA: Diagnosis not present

## 2023-09-07 DIAGNOSIS — E1122 Type 2 diabetes mellitus with diabetic chronic kidney disease: Secondary | ICD-10-CM | POA: Diagnosis not present

## 2023-09-07 DIAGNOSIS — N281 Cyst of kidney, acquired: Secondary | ICD-10-CM | POA: Diagnosis not present

## 2023-09-07 DIAGNOSIS — E1129 Type 2 diabetes mellitus with other diabetic kidney complication: Secondary | ICD-10-CM | POA: Diagnosis not present

## 2023-09-07 DIAGNOSIS — R809 Proteinuria, unspecified: Secondary | ICD-10-CM | POA: Diagnosis not present

## 2023-09-07 DIAGNOSIS — N184 Chronic kidney disease, stage 4 (severe): Secondary | ICD-10-CM | POA: Diagnosis not present

## 2023-09-12 DIAGNOSIS — N281 Cyst of kidney, acquired: Secondary | ICD-10-CM | POA: Diagnosis not present

## 2023-09-12 DIAGNOSIS — N184 Chronic kidney disease, stage 4 (severe): Secondary | ICD-10-CM | POA: Diagnosis not present

## 2023-09-12 DIAGNOSIS — E1129 Type 2 diabetes mellitus with other diabetic kidney complication: Secondary | ICD-10-CM | POA: Diagnosis not present

## 2023-09-12 DIAGNOSIS — E1122 Type 2 diabetes mellitus with diabetic chronic kidney disease: Secondary | ICD-10-CM | POA: Diagnosis not present

## 2023-09-12 DIAGNOSIS — I10 Essential (primary) hypertension: Secondary | ICD-10-CM | POA: Diagnosis not present

## 2023-09-12 DIAGNOSIS — R809 Proteinuria, unspecified: Secondary | ICD-10-CM | POA: Diagnosis not present

## 2023-09-19 ENCOUNTER — Other Ambulatory Visit: Payer: Self-pay | Admitting: Family Medicine

## 2023-09-19 DIAGNOSIS — I471 Supraventricular tachycardia, unspecified: Secondary | ICD-10-CM

## 2023-09-20 ENCOUNTER — Other Ambulatory Visit: Payer: Self-pay | Admitting: Urology

## 2023-09-20 DIAGNOSIS — D49511 Neoplasm of unspecified behavior of right kidney: Secondary | ICD-10-CM

## 2023-09-22 ENCOUNTER — Telehealth: Payer: Self-pay | Admitting: Family Medicine

## 2023-09-22 NOTE — Telephone Encounter (Signed)
[facility] verapamil (CALAN-SR) 240 MG CR tablet  Please advise

## 2023-09-23 NOTE — Telephone Encounter (Signed)
Verapamil prescription was refilled 09/20/2023 [facility] [place] Qty:90 R:4

## 2023-10-11 ENCOUNTER — Ambulatory Visit (INDEPENDENT_AMBULATORY_CARE_PROVIDER_SITE_OTHER): Payer: Self-pay

## 2023-10-11 VITALS — BP 134/70 | Ht 67.0 in | Wt 145.1 lb

## 2023-10-11 DIAGNOSIS — Z0001 Encounter for general adult medical examination with abnormal findings: Secondary | ICD-10-CM | POA: Diagnosis not present

## 2023-10-11 DIAGNOSIS — Z1211 Encounter for screening for malignant neoplasm of colon: Secondary | ICD-10-CM

## 2023-10-11 DIAGNOSIS — E119 Type 2 diabetes mellitus without complications: Secondary | ICD-10-CM

## 2023-10-11 DIAGNOSIS — Z1231 Encounter for screening mammogram for malignant neoplasm of breast: Secondary | ICD-10-CM

## 2023-10-11 DIAGNOSIS — Z0000 Encounter for general adult medical examination without abnormal findings: Secondary | ICD-10-CM

## 2023-10-11 NOTE — Progress Notes (Signed)
 Subjective:   Anna Sweeney is a 73 y.o. who presents for a Medicare Wellness preventive visit.  Visit Complete: In person   AWV Questionnaire: No: Patient Medicare AWV questionnaire was not completed prior to this visit.  Cardiac Risk Factors include: advanced age (>53men, >4 women);diabetes mellitus;hypertension;dyslipidemia;sedentary lifestyle     Objective:    Today's Vitals   10/11/23 1540  BP: 134/70  Weight: 145 lb 1.6 oz (65.8 kg)  Height: 5\' 7"  (1.702 m)   Body mass index is 22.73 kg/m.     10/11/2023    3:53 PM 10/05/2022    2:54 PM 09/29/2021   10:32 AM 09/23/2020   10:28 AM 07/17/2018    1:37 PM 08/18/2017   12:44 PM  Advanced Directives  Does Patient Have a Medical Advance Directive? No No No No No No  Does patient want to make changes to medical advance directive?  Yes (ED - Information included in AVS)      Would patient like information on creating a medical advance directive? No - Patient declined Yes (ED - Information included in AVS) No - Patient declined No - Patient declined No - Patient declined     Current Medications (verified) Outpatient Encounter Medications as of 10/11/2023  Medication Sig   Alcohol Swabs (DROPSAFE ALCOHOL PREP) 70 % PADS USE TO CHECK BLOOD SUGAR EVERY DAY.   amLODipine (NORVASC) 5 MG tablet Take 1 tablet (5 mg total) by mouth daily.   aspirin EC 81 MG tablet Take 81 mg by mouth daily.   betamethasone dipropionate (DIPROLENE) 0.05 % cream Apply topically 2 (two) times daily as needed.   Blood Glucose Monitoring Suppl (TRUE METRIX AIR GLUCOSE METER) w/Device KIT USE AS DIRECTED   Cholecalciferol (VITAMIN D-3) 125 MCG (5000 UT) TABS Take 5,000 Units by mouth daily.   Continuous Glucose Sensor (FREESTYLE LIBRE 3 SENSOR) MISC 1 each by Does not apply route every 14 (fourteen) days.   cyclobenzaprine (FLEXERIL) 5 MG tablet TAKE 1-2 TABLETS BY MOUTH 3 TIMES DAILY AS NEEDED FOR MUSCLE SPASMS.   Dulaglutide (TRULICITY) 3 MG/0.5ML SOAJ  Inject 3 mg as directed once a week.   glipiZIDE (GLUCOTROL XL) 2.5 MG 24 hr tablet TAKE 1 TABLET BY MOUTH EVERY DAY WITH BREAKFAST   ketoconazole (NIZORAL) 2 % cream Apply 1 Application topically 2 (two) times daily. Until clear   lovastatin (MEVACOR) 20 MG tablet TAKE 1 TABLET BY MOUTH (20 MG TOTAL) EVERY EVENING   meclizine (ANTIVERT) 12.5 MG tablet TAKE 1 TABLET (12.5 MG TOTAL) BY MOUTH 2 (TWO) TIMES DAILY AS NEEDED FOR DIZZINESS.   mupirocin cream (BACTROBAN) 2 % Apply 1 application topically 2 (two) times daily.   triamcinolone cream (KENALOG) 0.5 % Apply 1 application topically 2 (two) times daily.   TRUE METRIX BLOOD GLUCOSE TEST test strip TEST BLOOD SUGAR EVERY DAY   TRUEplus Lancets 33G MISC TEST BLOOD SUGAR EVERY DAY   verapamil (CALAN-SR) 240 MG CR tablet TAKE 1 TABLET BY MOUTH AT BEDTIME.   Clobetasol Prop Emollient Base (CLOBETASOL PROPIONATE E) 0.05 % emollient cream Apply to aa's lower legs BID until clear. Then PRN thereafter. Avoid f/g/a. (Patient not taking: Reported on 10/11/2023)   naproxen (NAPROSYN) 500 MG tablet TAKE 1 TABLET BY MOUTH 2 TIMES DAILY WITH A MEAL. (Patient not taking: Reported on 10/11/2023)   neomycin-polymyxin-hydrocortisone (CORTISPORIN) OTIC solution Place 3 drops into both ears 2 (two) times daily as needed (itching). (Patient not taking: Reported on 10/11/2023)   No facility-administered encounter  medications on file as of 10/11/2023.    Allergies (verified) Pioglitazone   History: Past Medical History:  Diagnosis Date   BRCA negative 11/15/2012   [facility]   Diabetes mellitus without complication (HCC)    History of chicken pox    Hypertension    Past Surgical History:  Procedure Laterality Date   FOOT SURGERY     IR RADIOLOGIST EVAL & MGMT  06/15/2022   IR RADIOLOGIST EVAL & MGMT  05/20/2023   Family History  Problem Relation Age of Onset   Breast cancer Mother    Breast cancer Sister    Cancer Sister        renal    Social History    Socioeconomic History   Marital status: Widowed    Spouse name: Not on file   Number of children: 2   Years of education: Not on file   Highest education level: Some college, no degree  Occupational History   Occupation: retired  Tobacco Use   Smoking status: Former    Current packs/day: 1.00    Average packs/day: 1 pack/day for 4.0 years (4.0 ttl pk-yrs)    Types: Cigarettes   Smokeless tobacco: Never   Tobacco comments:    quit in 1991  Vaping Use   Vaping status: Never Used  Substance and Sexual Activity   Alcohol use: No   Drug use: No   Sexual activity: Yes    Birth control/protection: Post-menopausal  Other Topics Concern   Not on file  Social History Narrative   Not on file   Social Drivers of Health   Financial Resource Strain: Low Risk  (10/05/2022)   Overall Financial Resource Strain (CARDIA)    Difficulty of Paying Living Expenses: Not hard at all  Food Insecurity: No Food Insecurity (10/11/2023)   Hunger Vital Sign    Worried About Running Out of Food in the Last Year: Never true    Ran Out of Food in the Last Year: Never true  Transportation Needs: No Transportation Needs (10/11/2023)   PRAPARE - Administrator, Civil Service (Medical): No    Lack of Transportation (Non-Medical): No  Physical Activity: Insufficiently Active (10/11/2023)   Exercise Vital Sign    Days of Exercise per Week: 3 days    Minutes of Exercise per Session: 10 min  Stress: No Stress Concern Present (10/11/2023)   [employer] of Occupational Health - Occupational Stress Questionnaire    Feeling of Stress : Not at all  Social Connections: Socially Isolated (10/11/2023)   Social Connection and Isolation Panel [NHANES]    Frequency of Communication with Friends and Family: More than three times a week    Frequency of Social Gatherings with Friends and Family: More than three times a week    Attends Religious Services: Never    Database administrator or Organizations: No     Attends Banker Meetings: Never    Marital Status: Widowed    Tobacco Counseling Counseling given: Not Answered Tobacco comments: quit in 1991    Clinical Intake:  Pre-visit preparation completed: Yes  Pain : No/denies pain     BMI - recorded: 22.73 Nutritional Status: BMI of 19-24  Normal Nutritional Risks: None Diabetes: Yes CBG done?: No Did pt. bring in CBG monitor from home?: No  How often do you need to have someone help you when you read instructions, pamphlets, or other written materials from your doctor or pharmacy?: 1 - Never  Interpreter  Needed?: No  Information entered by :: Kennedy Bucker, LPN   Activities of Daily Living     10/11/2023    3:55 PM 01/21/2023   10:08 AM  In your present state of health, do you have any difficulty performing the following activities:  Hearing? 0 0  Vision? 0 0  Difficulty concentrating or making decisions? 0 0  Walking or climbing stairs? 1 0  Comment BACK PAIN GOING UP   Dressing or bathing? 0 0  Doing errands, shopping? 0 0  Preparing Food and eating ? N   Using the Toilet? N   In the past six months, have you accidently leaked urine? N   Do you have problems with loss of bowel control? N   Managing your Medications? N   Managing your Finances? N   Housekeeping or managing your Housekeeping? N     Patient Care Team: Malva Limes, MD as PCP - General (Family Medicine) Chinita Pester as Referring Physician (Chiropractic Medicine) Riki Altes, MD (Urology) Mosetta Pigeon, MD (Nephrology) [facility], [facility])  Indicate any recent Medical Services you may have received from other than [facility] providers in the past year (date may be approximate).     Assessment:   This is a routine wellness examination for [place].  Hearing/Vision screen Hearing Screening - Comments:: NO AIDS Vision Screening - Comments:: WEARS GLASSES ALL THE TIME-  EYE   Goals Addressed              This Visit's Progress    DIET - REDUCE SUGAR INTAKE         Depression Screen     10/11/2023    3:51 PM 08/29/2023   11:32 AM 06/20/2023    9:57 AM 01/21/2023   10:08 AM 10/05/2022    2:51 PM 12/02/2021   10:40 AM 09/29/2021   10:30 AM  PHQ 2/9 Scores  PHQ - 2 Score 0 0 0 0 0 0 0  PHQ- 9 Score 0  0 0  0     Fall Risk     10/11/2023    3:55 PM 08/29/2023   11:32 AM 06/20/2023    9:57 AM 01/21/2023   10:08 AM 10/05/2022    2:46 PM  Fall Risk   Falls in the past year? 0 0 0 0 0  Number falls in past yr: 0 0 0 0 0  Injury with Fall? 0 0 0 0 0  Risk for fall due to : No Fall Risks No Fall Risks  No Fall Risks No Fall Risks  Follow up Falls prevention discussed;Falls evaluation completed   Falls evaluation completed Education provided;Falls prevention discussed    MEDICARE RISK AT HOME:  Medicare Risk at Home Any stairs in or around the home?: Yes If so, are there any without handrails?: No Home free of loose throw rugs in walkways, pet beds, electrical cords, etc?: Yes Adequate lighting in your home to reduce risk of falls?: Yes Life alert?: No Use of a cane, walker or w/c?: No Grab bars in the bathroom?: No Shower chair or bench in shower?: No Elevated toilet seat or a handicapped toilet?: No  TIMED UP AND GO:  Was the test performed?  Yes  Length of time to ambulate 10 feet: 4 sec Gait steady and fast without use of assistive device  Cognitive Function: 6CIT completed        10/11/2023    4:07 PM 10/05/2022    3:02 PM  6CIT Screen  What Year? 0 points 0 points  What month? 0 points 0 points  What time? 0 points 0 points  Count back from 20 0 points 0 points  Months in reverse 0 points 0 points  Repeat phrase 0 points 0 points  Total Score 0 points 0 points    Immunizations Immunization History  Administered Date(s) Administered   Fluad Quad(high Dose 65+) 06/15/2019, 05/01/2020, 06/23/2021, 09/06/2022   Fluad Trivalent(High Dose 65+) 04/25/2023   Influenza,  High Dose Seasonal PF 04/19/2018   Influenza,inj,Quad PF,6+ Mos 07/17/2015, 07/09/2016   Pneumococcal Conjugate-13 11/16/2016   Pneumococcal Polysaccharide-23 09/09/2011, 07/17/2018   Tdap 07/10/2013    Screening Tests Health Maintenance  Topic Date Due   Zoster Vaccines- Shingrix (1 of 2) Never done   COVID-19 Vaccine (1 - 2024-25 season) Never done   DTaP/Tdap/Td (2 - Td or Tdap) 07/11/2023   Fecal DNA (Cologuard)  10/29/2023   MAMMOGRAM  11/30/2023   OPHTHALMOLOGY EXAM  12/03/2023   Diabetic kidney evaluation - Urine ACR  12/15/2023   Diabetic kidney evaluation - eGFR measurement  12/20/2023   HEMOGLOBIN A1C  02/29/2024   Medicare Annual Wellness (AWV)  10/10/2024   DEXA SCAN  02/16/2025   Pneumonia Vaccine 61+ Years old  Completed   INFLUENZA VACCINE  Completed   Hepatitis C Screening  Completed   HPV VACCINES  Aged Out    Health Maintenance  Health Maintenance Due  Topic Date Due   Zoster Vaccines- Shingrix (1 of 2) Never done   COVID-19 Vaccine (1 - 2024-25 season) Never done   DTaP/Tdap/Td (2 - Td or Tdap) 07/11/2023   Health Maintenance Items Addressed: DECLINED SHOTS, UP TO DATE ON FLU MAMMOGRAM AND COLONOSCOPY REFERRALS SENT  Additional Screening:  Vision Screening: Recommended annual ophthalmology exams for early detection of glaucoma and other disorders of the eye.  Dental Screening: Recommended annual dental exams for proper oral hygiene  Community Resource Referral / Chronic Care Management: CRR required this visit?  No   CCM required this visit?  No     Plan:     I have personally reviewed and noted the following in the patient's chart:   Medical and social history Use of alcohol, tobacco or illicit drugs  Current medications and supplements including opioid prescriptions. Patient is not currently taking opioid prescriptions. Functional ability and status Nutritional status Physical activity Advanced directives List of other  physicians Hospitalizations, surgeries, and ER visits in previous 12 months Vitals Screenings to include cognitive, depression, and falls Referrals and appointments  In addition, I have reviewed and discussed with patient certain preventive protocols, quality metrics, and best practice recommendations. A written personalized care plan for preventive services as well as general preventive health recommendations were provided to patient.     Hal Hope, LPN   4/0/9811   After Visit Summary: (In Person-Declined) Patient declined AVS at this time.  Notes:  REFERRALS FOR COLONOSCOPY & MAMMOGRAM SENT

## 2023-10-11 NOTE — Patient Instructions (Addendum)
 Anna Sweeney , Thank you for taking time to come for your Medicare Wellness Visit. I appreciate your ongoing commitment to your health goals. Please review the following plan we discussed and let me know if I can assist you in the future.   Referrals/Orders/Follow-Ups/Clinician Recommendations: REFERRALS FOR COLONOSCOPY AND MAMMOGRAM SENT  You have an order for:  []   2D Mammogram  [x]   3D Mammogram  []   Bone Density     Please call for appointment:  [facility]  [address] [place] [address] [phone]  [facility] [address] [place], [place] [address] [phone]  [facility] [address] Geanie Logan [place], [place] [address] [phone]    Make sure to wear two-piece clothing.  No lotions, powders, or deodorants the day of the appointment. Make sure to bring picture ID and insurance card.  Bring list of medications you are currently taking including any supplements.   Schedule your [place] screening mammogram through MyChart!   Log into your MyChart account.  Go to 'Visit' (or 'Appointments' if on mobile App) --> Schedule an Appointment  Under 'Select a Reason for Visit' choose the Mammogram Screening option.  Complete the pre-visit questions and select the time and place that best fits your schedule.    This is a list of the screening recommended for you and due dates:  Health Maintenance  Topic Date Due   Zoster (Shingles) Vaccine (1 of 2) Never done   COVID-19 Vaccine (1 - 2024-25 season) Never done   DTaP/Tdap/Td vaccine (2 - Td or Tdap) 07/11/2023   Cologuard (Stool DNA test)  10/29/2023   Mammogram  11/30/2023   Eye exam for diabetics  12/03/2023   Yearly kidney health urinalysis for diabetes  12/15/2023   Yearly kidney function blood test for diabetes  12/20/2023   Hemoglobin A1C  02/29/2024   Medicare Annual  Wellness Visit  10/10/2024   DEXA scan (bone density measurement)  02/16/2025   Pneumonia Vaccine  Completed   Flu Shot  Completed   Hepatitis C Screening  Completed   HPV Vaccine  Aged Out    Advanced directives: (ACP Link)Information on Advanced Care Planning can be found at [facility] Advance Health Care Directives Advance Health Care Directives ([email or web address]   Next Medicare Annual Wellness Visit scheduled for next year: Yes   10/23/24 @ 3:50 PM IN PERSON

## 2023-10-12 ENCOUNTER — Ambulatory Visit: Payer: Self-pay | Admitting: Urology

## 2023-10-12 ENCOUNTER — Encounter: Payer: Self-pay | Admitting: Urology

## 2023-10-12 VITALS — BP 130/80 | HR 74 | Ht 67.0 in | Wt 145.0 lb

## 2023-10-12 DIAGNOSIS — N281 Cyst of kidney, acquired: Secondary | ICD-10-CM

## 2023-10-12 LAB — URINALYSIS, COMPLETE
Bilirubin, UA: NEGATIVE
Glucose, UA: NEGATIVE
Ketones, UA: NEGATIVE
Nitrite, UA: NEGATIVE
RBC, UA: NEGATIVE
Specific Gravity, UA: 1.025 (ref 1.005–1.030)
Urobilinogen, Ur: 0.2 mg/dL (ref 0.2–1.0)
pH, UA: 5 (ref 5.0–7.5)

## 2023-10-12 LAB — MICROSCOPIC EXAMINATION

## 2023-10-12 NOTE — Progress Notes (Signed)
 I, Maysun Anabel Bene, acting as a scribe for Riki Altes, MD., have documented all relevant documentation on the behalf of Riki Altes, MD, as directed by Riki Altes, MD while in the presence of Riki Altes, MD.  10/12/2023 12:01 PM   Stanton Kidney Delford Field 1951-04-27 [id number]  Referring provider: Malva Limes, MD [address] [place],  [place] [address]  Chief Complaint  Patient presents with   Other   Urologic history: 1.  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 female presents for follow-up visit.  No problem since her last visit.  She was seen by IR to discuss possible percutaneous ablation of her complex cyst, however due to its location, it was felt by IR that the mass was not amenable to percutaneous ablation due to the risk of hemorrhage and duodenal injury. A follow-up MRI 08/25/23 showed no significant size change from the previous scan and it measured 2.8 x 2.3 cm.   PMH: Past Medical History:  Diagnosis Date   BRCA negative 11/15/2012   [facility]riad   Diabetes mellitus without complication (HCC)    History of chicken pox    Hypertension     Surgical History: Past Surgical History:  Procedure Laterality Date   FOOT SURGERY     IR RADIOLOGIST EVAL & MGMT  06/15/2022   IR RADIOLOGIST EVAL & MGMT  05/20/2023    Home Medications:  Allergies as of 10/12/2023       Reactions   Pioglitazone    Dizziness        Medication List        Accurate as of October 12, 2023 12:01 PM. If you have any questions, ask your nurse or doctor.          STOP taking these medications    Clobetasol Prop Emollient Base 0.05 % emollient cream Commonly known as: Clobetasol Propionate E Stopped by: Riki Altes   DropSafe Alcohol Prep 70 % Pads Stopped by: Riki Altes   naproxen 500 MG tablet Commonly known as: NAPROSYN Stopped by: Riki Altes   True Metrix Air Glucose Meter w/Device  Kit Stopped by: Riki Altes       TAKE these medications    amLODipine 5 MG tablet Commonly known as: NORVASC Take 1 tablet (5 mg total) by mouth daily.   aspirin EC 81 MG tablet Take 81 mg by mouth daily.   betamethasone dipropionate 0.05 % cream Apply topically 2 (two) times daily as needed.   cyclobenzaprine 5 MG tablet Commonly known as: FLEXERIL TAKE 1-2 TABLETS BY MOUTH 3 TIMES DAILY AS NEEDED FOR MUSCLE SPASMS.   FreeStyle Libre 3 Sensor Misc 1 each by Does not apply route every 14 (fourteen) days.   glipiZIDE 2.5 MG 24 hr tablet Commonly known as: GLUCOTROL XL TAKE 1 TABLET BY MOUTH EVERY DAY WITH BREAKFAST   ketoconazole 2 % cream Commonly known as: NIZORAL Apply 1 Application topically 2 (two) times daily. Until clear   lovastatin 20 MG tablet Commonly known as: MEVACOR TAKE 1 TABLET BY MOUTH (20 MG TOTAL) EVERY EVENING   meclizine 12.5 MG tablet Commonly known as: ANTIVERT TAKE 1 TABLET (12.5 MG TOTAL) BY MOUTH 2 (TWO) TIMES DAILY AS NEEDED FOR DIZZINESS.   mupirocin cream 2 % Commonly known as: BACTROBAN Apply 1 application topically 2 (two) times daily.   neomycin-polymyxin-hydrocortisone OTIC solution Commonly known as: CORTISPORIN Place  3 drops into both ears 2 (two) times daily as needed (itching).   triamcinolone cream 0.5 % Commonly known as: KENALOG Apply 1 application topically 2 (two) times daily.   True Metrix Blood Glucose Test test strip Generic drug: glucose blood TEST BLOOD SUGAR EVERY DAY   TRUEplus Lancets 33G Misc TEST BLOOD SUGAR EVERY DAY   Trulicity 3 MG/0.5ML Soaj Generic drug: Dulaglutide Inject 3 mg as directed once a week.   verapamil 240 MG CR tablet Commonly known as: CALAN-SR TAKE 1 TABLET BY MOUTH AT BEDTIME.   Vitamin D-3 125 MCG (5000 UT) Tabs Take 5,000 Units by mouth daily.        Allergies:  Allergies  Allergen Reactions   Pioglitazone     Dizziness    Family History: Family History   Problem Relation Age of Onset   Breast cancer Mother    Breast cancer Sister    Cancer Sister        renal     Social History:  reports that she has quit smoking. Her smoking use included cigarettes. She has a 4 pack-year smoking history. She has never used smokeless tobacco. She reports that she does not drink alcohol and does not use drugs.   Physical Exam: BP 130/80   Pulse 74   Ht 5\' 7"  (1.702 m)   Wt 145 lb (65.8 kg)   BMI 22.71 kg/m   Constitutional:  Alert and oriented, No acute distress. HEENT: [place] AT Respiratory: Normal respiratory effort, no increased work of breathing. Psychiatric: Normal mood and affect.   Urinalysis Dipstick trace protein/trace leukocytes, microscopy negative   Pertinent Imaging: MRI was personally reviewed and interpreted.   MRI EXAM: MRI ABDOMEN WITHOUT AND WITH CONTRAST   TECHNIQUE: Multiplanar multisequence MR imaging of the abdomen was performed both before and after the administration of intravenous contrast.   CONTRAST:  7 mL Vueway gadolinium contrast IV   COMPARISON:  05/13/2023   FINDINGS: Lower chest: No acute abnormality.   Hepatobiliary: No solid liver abnormality is seen. Mildly coarse contour of the liver. No gallstones, gallbladder wall thickening, or biliary dilatation.   Pancreas: Unremarkable. No pancreatic ductal dilatation or surrounding inflammatory changes.   Spleen: Normal in size without significant abnormality.   Adrenals/Urinary Tract: Adrenal glands are unremarkable. Unchanged complex cystic lesion arising from the anterior midportion of the right kidney, measuring 2.8 x 2.3 cm, with simple fluid and hemorrhagic or proteinaceous contents, as well as enhancing internal septations and small enhancing nodular components (series 10, image 59). Multiple additional simple, benign fluid signal and hemorrhagic or proteinaceous cysts, for which no specific further follow-up or characterization is required.  No obvious calculi. No hydronephrosis.   Stomach/Bowel: Stomach is within normal limits. No evidence of bowel wall thickening, distention, or inflammatory changes. Descending colonic diverticulosis.   Vascular/Lymphatic: Aortic atherosclerosis. No enlarged abdominal lymph nodes.   Other: No abdominal wall hernia or abnormality. No ascites.   Musculoskeletal: No acute or significant osseous findings.   IMPRESSION: 1. Unchanged complex cystic lesion arising from the anterior midportion of the right kidney, measuring 2.8 x 2.3 cm, with simple fluid and hemorrhagic or proteinaceous contents, as well as enhancing internal septations and small enhancing nodular components. 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 2. No evidence of lymphadenopathy or metastatic disease in the abdomen. 3. Mildly coarse contour of the liver, suggestive of cirrhosis. 4. Descending colonic diverticulosis.   Aortic Atherosclerosis (ICD10-I70.0).  Electronically Signed   By: Jearld Lesch M.D.   On: 08/29/2023 21:19   Assessment & Plan:    1. Bosniak III left renal cysts Not amenable to percutaneous ablation due to related complications. Partial nephrectomy may be difficult due to the mass location.  Offered her referral to a tertiary center to discuss surgical options, however she has elected surveillance.  Schedule follow-up renal mass protocol MRI October 2025.  I have reviewed the above documentation for accuracy and completeness, and I agree with the above.   Riki Altes, MD  [facility] [address], [address] [place], [place] [address] [phone]

## 2023-10-18 ENCOUNTER — Ambulatory Visit: Payer: No Typology Code available for payment source | Admitting: Family Medicine

## 2023-10-21 ENCOUNTER — Other Ambulatory Visit: Payer: Self-pay | Admitting: Family Medicine

## 2023-10-21 DIAGNOSIS — S22080A Wedge compression fracture of T11-T12 vertebra, initial encounter for closed fracture: Secondary | ICD-10-CM

## 2023-10-21 DIAGNOSIS — M8080XA Other osteoporosis with current pathological fracture, unspecified site, initial encounter for fracture: Secondary | ICD-10-CM

## 2023-10-21 NOTE — Telephone Encounter (Signed)
 Please check with patient regarding this med. Pharmacist documented that the patient stopped taking it and was supposed to be referred to endocrinology for Prolia. Did she every get in with endocrinology, or is she still taking the alendronate?

## 2023-10-25 ENCOUNTER — Encounter: Payer: Self-pay | Admitting: *Deleted

## 2023-10-26 ENCOUNTER — Encounter (INDEPENDENT_AMBULATORY_CARE_PROVIDER_SITE_OTHER): Admitting: Ophthalmology

## 2023-11-01 ENCOUNTER — Encounter: Payer: Self-pay | Admitting: Internal Medicine

## 2023-11-01 ENCOUNTER — Ambulatory Visit: Payer: Self-pay | Admitting: *Deleted

## 2023-11-01 ENCOUNTER — Ambulatory Visit: Admitting: Internal Medicine

## 2023-11-01 ENCOUNTER — Other Ambulatory Visit: Payer: Self-pay

## 2023-11-01 VITALS — BP 132/84 | HR 98 | Temp 98.0°F | Resp 16 | Ht 67.0 in | Wt 145.1 lb

## 2023-11-01 DIAGNOSIS — S29011A Strain of muscle and tendon of front wall of thorax, initial encounter: Secondary | ICD-10-CM | POA: Diagnosis not present

## 2023-11-01 NOTE — Progress Notes (Signed)
   Acute Office Visit  Subjective:     Patient ID: Anna Sweeney, female    DOB: 06-21-1951, 73 y.o.   MRN: [id number]  Chief Complaint  Patient presents with   Muscle Pain    Muscle strain right side, felt a pop yesterday    HPI Patient is in today for pain under her right breast.  She states she was reaching far into the backseat of her car yesterday to get her umbrella when she felt a pulling type pain and heard a pop under her right breast.  She was able to do her regular activities for the rest of the day but then noticed pain later that night.  The pain is very localized over her rib and is nonradiating.  He describes it as sharp pain and worse with movements especially twisting or bending over.  She did not take any over-the-counter medication for the pain.  She denies any skin changes.  Pain today feels about the same compared to yesterday.  Review of Systems  Constitutional:  Negative for chills and fever.  Musculoskeletal:  Positive for joint pain.  Skin:  Negative for rash.  All other systems reviewed and are negative.       Objective:    BP 132/84 (Cuff Size: Large)   Pulse 98   Temp 98 F (36.7 C) (Oral)   Resp 16   Ht 5\' 7"  (1.702 m)   Wt 145 lb 1.6 oz (65.8 kg)   SpO2 98%   BMI 22.73 kg/m    Physical Exam Constitutional:      Appearance: Normal appearance.  HENT:     Head: Normocephalic and atraumatic.  Eyes:     Conjunctiva/sclera: Conjunctivae normal.  Cardiovascular:     Rate and Rhythm: Normal rate and regular rhythm.  Pulmonary:     Effort: Pulmonary effort is normal.     Breath sounds: Normal breath sounds.  Musculoskeletal:        General: Tenderness present. No swelling.     Comments: Pain to palpation over anterior aspect of right seventh rib  Skin:    General: Skin is warm and dry.  Neurological:     General: No focal deficit present.     Mental Status: She is alert. Mental status is at baseline.  Psychiatric:        Mood and Affect:  Mood normal.        Behavior: Behavior normal.     No results found for any visits on 11/01/23.      Assessment & Plan:   1. Intercostal muscle strain, initial encounter (Primary): Patient unfortunately cannot take anti-inflammatories due to her chronic kidney disease.  Recommend Tylenol as needed for pain as well as muscle relaxer (patient has leftover Flexeril from a prescription from last year).  Also discussed moist heat and topical medication such as Voltaren to help with pain.  Return if symptoms worsen or fail to improve.  Margarita Mail, DO

## 2023-11-01 NOTE — Telephone Encounter (Signed)
FYI please see the message below.

## 2023-11-01 NOTE — Telephone Encounter (Signed)
  Chief Complaint: side pain- heard "pop" when reaching for unbrella in back seat of car yesterday Symptoms: right side pain at rib Frequency: symptoms started yesterday Pertinent Negatives: Patient denies swelling ,inflammation Disposition: [] ED /[] Urgent Care (no appt availability in office) / [x] Appointment(In office/virtual)/ []  [facility]/ [] Home Care/ [] Refused Recommended Disposition /[] [facility]/ []  Follow-up with PCP Additional Notes: Appointment scheduled for evaluation    Copied from CRM [id number]. Topic: Clinical - Red Word Triage >> Nov 01, 2023  9:28 AM Gildardo Pounds wrote: Red Word that prompted transfer to Nurse Triage: Patient heard a pop on her right side when reaching behind her yesterday. Now right side is hurting really bad. Pain level okay but moving is a 7. Callback number is [phone] Reason for Disposition  [1] SEVERE pain (e.g., excruciating, unable to do any normal activities) AND [2] not improved 2 hours after pain medicine  Answer Assessment - Initial Assessment Questions 1. ONSET: "When did the muscle aches or body pains start?"      yesterday 2. LOCATION: "What part of your body is hurting?" (e.g., entire body, arms, legs)      Right side- at ribs 3. SEVERITY: "How bad is the pain?" (Scale 1-10; or mild, moderate, severe)   - MILD (1-3): doesn't interfere with normal activities    - MODERATE (4-7): interferes with normal activities or awakens from sleep    - SEVERE (8-10):  excruciating pain, unable to do any normal activities      7/10 4. CAUSE: "What do you think is causing the pains?"     Stretching pain- "heard pop" when reaching back for umbrella 5. FEVER: "Have you been having fever?"     no 6. OTHER SYMPTOMS: "Do you have any other symptoms?" (e.g., chest pain, weakness, rash, cold or flu symptoms, weight loss)     No swelling, inflammation, not tender to touch- hurts with movement  Protocols used: Muscle Aches and Body  Pain-A-AH

## 2023-11-01 NOTE — Patient Instructions (Addendum)
 It was great seeing you today!  Plan discussed at today's visit: -Recommend moist heat, topical medications like Voltaren which is anti-inflammatory ointment you can get over the counter that can help with pain -Can also take Tylenol and Flexeril (muscle relaxer) as needed for pain  Follow up in: as needed  Take care and let us know if you have any questions or concerns prior to your next visit.  Dr. Caralee Ates

## 2023-11-03 NOTE — Progress Notes (Signed)
 [facility] - Clinic Note  11/15/2023   CHIEF COMPLAINT Patient presents for Retina Evaluation  HISTORY OF PRESENT ILLNESS: Hannah Odom is a 73 y.o. female who presents to the clinic today for:  HPI     Retina Evaluation   In both eyes.  This started 16 years ago.  Duration of 16 years.  I, the attending physician,  performed the HPI with the patient and updated documentation appropriately.        Comments   Retina eval per Dr Laveda Norman pt is reporting that she has had ARMD and had injections with Dr Gwendalyn Ege but has been years since having one she is reporting decreased and blurred vision she has flashes and floaters she is diabetic her last reading was 183 and A1C 7.1      Last edited by Rennis Chris, MD on 11/15/2023  5:37 PM.    Patient states the vision OS has decreased a few months ago. OD has been poor and was told, "nothing more could be done for it."  Referring physician: Derryl Harbor, OD [address] [place],  [place] [address]  HISTORICAL INFORMATION:  Selected notes from the MEDICAL RECORD NUMBER Referred by Dr. Laveda Norman for exu ARMD OS LEE:  Ocular Hx- history of ex ARMD OD s/p IVA OD x17, last in 07.10.12 Theodoro Kalata at [facility]) H/o refractive surgery with Dr. Delaney Meigs in early 2000s; h/o of sectoral retinitis pigmentosa OU PMH-   CURRENT MEDICATIONS: Current Outpatient Medications (Ophthalmic Drugs)  Medication Sig   brimonidine (ALPHAGAN P) 0.1 % SOLN Apply to eye.   No current facility-administered medications for this visit. (Ophthalmic Drugs)   Current Outpatient Medications (Other)  Medication Sig   acetaminophen (TYLENOL) 650 MG CR tablet Take by mouth.   calcipotriene (DOVONOX) 0.005 % cream Apply topically 2 (two) times daily.   Cholecalciferol 50 MCG (2000 UT) CAPS Take by mouth.   clobetasol cream (TEMOVATE) 0.05 % Apply topically 2 (two) times daily as needed.   furosemide (LASIX) 40 MG tablet Take 40 mg by mouth daily.    hydrOXYzine (ATARAX) 50 MG tablet Take by mouth.   levothyroxine (SYNTHROID) 25 MCG tablet TK 1 T PO D   linaclotide (LINZESS) 145 MCG CAPS capsule Take by mouth.   lisinopril (ZESTRIL) 20 MG tablet Take by mouth.   MOUNJARO 2.5 MG/0.5ML Pen SMARTSIG:2.5 Milligram(s) SUB-Q Once a Week   MYRBETRIQ 50 MG TB24 tablet Take 50 mg by mouth daily.   pravastatin (PRAVACHOL) 40 MG tablet Take 40 mg by mouth at bedtime.   tiZANidine (ZANAFLEX) 4 MG tablet Take by mouth.   ARIPiprazole (ABILIFY) 2 MG tablet Take by mouth.   DULoxetine (CYMBALTA) 60 MG capsule Take 60 mg by mouth 2 (two) times daily.   ezetimibe (ZETIA) 10 MG tablet Take 10 mg by mouth daily.   JARDIANCE 10 MG TABS tablet Take 10 mg by mouth daily.   mirtazapine (REMERON) 30 MG tablet Take by mouth.   omeprazole (PRILOSEC) 40 MG capsule Take 40 mg by mouth daily.   valACYclovir (VALTREX) 500 MG tablet Take 500 mg by mouth daily.   No current facility-administered medications for this visit. (Other)   REVIEW OF SYSTEMS: ROS   Positive for: Endocrine, Eyes, Allergic/Imm Last edited by Etheleen Mayhew, COT on 11/15/2023  1:26 PM.     ALLERGIES No Known Allergies PAST MEDICAL HISTORY Past Medical History:  Diagnosis Date   Diabetes mellitus without complication (HCC)  Hypertension    Macular degeneration    Past Surgical History:  Procedure Laterality Date   LASIK     FAMILY HISTORY History reviewed. No pertinent family history. SOCIAL HISTORY Social History   Tobacco Use   Smoking status: Never   Smokeless tobacco: Never  Vaping Use   Vaping status: Never Used  Substance Use Topics   Alcohol use: Never   Drug use: Never       OPHTHALMIC EXAM:  Base Eye Exam     Visual Acuity (Snellen - Linear)       Right Left   Dist [place] HM 20/100   Dist ph [place] NI 20/100 -2         Tonometry (Tonopen, 1:33 PM)       Right Left   Pressure 16 12         Pupils       Pupils Dark Light Shape React APD    Right PERRL 4 4 Round NR APD   Left PERRL 3 2 Round Sluggish None         Visual Fields       Left Right    Full    Restrictions  Total superior temporal, inferior temporal, superior nasal, inferior nasal deficiencies         Extraocular Movement       Right Left    Full, Ortho Full, Ortho         Neuro/Psych     Oriented x3: Yes   Mood/Affect: Normal         Dilation     Both eyes: 2.5% Phenylephrine @ 1:32 PM           Slit Lamp and Fundus Exam     External Exam       Right Left   External Normal Normal         Slit Lamp Exam       Right Left   Lids/Lashes Dermatochalasis - upper lid Dermatochalasis - upper lid   Conjunctiva/Sclera Trace Injection,  Trace Injection, temporal Pinguecula   Cornea Arcus, Debris in tear film, 4 cut RK Arcus, focal corneal haze/scar superior midzone   Anterior Chamber Deep and clear Deep and clear   Iris Round and dilated, No NVI Round and dilated, No NVI   Lens 3+ Nuclear sclerosis with brunescence, 3+ Cortical cataract 2-3+ Nuclear sclerosis, 2-3+ Cortical cataract   Anterior Vitreous Normal Posterior vitreous detachment         Fundus Exam       Right Left   Disc Hazy view, Pink and sharp Pink and sharp, mild PPP   C/D Ratio 0.2 0.2   Macula Flat, Blunted foveal reflex, central disciform scar with surrounding atropy and pigment clumping Blunted foveal reflex, CNV with edema and positive heme nasal macula   Vessels Vascular attenuation, Tortuous Vascular attenuation, Tortuous   Periphery Attached, rare MA Attached, No heme, mild reticular degeneration, focal bone spicules nasal quad and RPE atrophy           Refraction     Manifest Refraction   Unable to correct os           IMAGING AND PROCEDURES  Imaging and Procedures for 11/15/2023  OCT, Retina - OU - Both Eyes       Right Eye Central Foveal Thickness: 259. Progression has no prior data. Findings include no IRF, no SRF, abnormal foveal  contour, retinal drusen , outer retinal tubulation, subretinal hyper-reflective material,  pigment epithelial detachment (Central disciform scar no fluid, no DME).   Left Eye Quality was good. Central Foveal Thickness: 323. Progression has no prior data. Findings include no IRF, abnormal foveal contour, subretinal hyper-reflective material, pigment epithelial detachment (CNV with surrounding edema nasal macula, no DME).   Notes *Images captured and stored on drive  Diagnosis / Impression:  Exudative ARMD OU OD: Central disciform scar no fluid, no DME OS: CNV with surrounding edema nasal macula, no DME  Clinical management:  See below  Abbreviations: NFP - Normal foveal profile. CME - cystoid macular edema. PED - pigment epithelial detachment. IRF - intraretinal fluid. SRF - subretinal fluid. EZ - ellipsoid zone. ERM - epiretinal membrane. ORA - outer retinal atrophy. ORT - outer retinal tubulation. SRHM - subretinal hyper-reflective material. IRHM - intraretinal hyper-reflective material      Intravitreal Injection, Pharmacologic Agent - OS - Left Eye       Time Out 11/15/2023. 3:30 PM. Confirmed correct patient, procedure, site, and patient consented.   Anesthesia Topical anesthesia was used. Anesthetic medications included Lidocaine 2%, Proparacaine 0.5%.   Procedure Preparation included 5% betadine to ocular surface, eyelid speculum. A supplied needle was used.   Injection: 1.25 mg Bevacizumab 1.25mg /0.68ml   Route: Intravitreal, Site: Left Eye   NDC: 29562-130-86, Lot: [id number] , Expiration date: 12/17/2023   Post-op Post injection exam found visual acuity of at least counting fingers. The patient tolerated the procedure well. There were no complications. The patient received written and verbal post procedure care education.           ASSESSMENT/PLAN:   ICD-10-CM   1. Exudative age-related macular degeneration of left eye with active choroidal neovascularization (HCC)   H35.3221 OCT, Retina - OU - Both Eyes    Intravitreal Injection, Pharmacologic Agent - OS - Left Eye    Bevacizumab (AVASTIN) SOLN 1.25 mg    2. Exudative age-related macular degeneration of right eye with inactive scar (HCC)  H35.3213     3. Diabetes mellitus type 2 without retinopathy (HCC)  E11.9     4. Diabetes mellitus treated with oral medication (HCC)  E11.9    Z79.84     5. Diabetes mellitus treated with injections of non-insulin medication (HCC)  E11.9    Z79.85     6. Essential hypertension  I10     7. Hypertensive retinopathy of both eyes  H35.033     8. Cortical age-related cataract of both eyes  H25.013      1,2 .Exudative age related macular degeneration, OU  - OD chronic disciform scar / inactive  - OS newly converted  - previously managed at [facility] by Dr. Gwendalyn Ege  - h/o ex ARMD OD - s/p IVA OD x12, last injxn 2012 -- now w/ central disciform scar / atrophy  - OS with recent conversion -- pt reports decreased vision OS x several months  - BCVA OD HM, OS 20/100  - OCT shows OD: Central disciform scar no fluid, no DME; OS: CNV with surrounding edema nasal macula, no DME  - The incidence pathology and anatomy of wet AMD discussed  - discussed treatment options including observation vs intravitreal anti-VEGF agents such as Avastin, Lucentis, Eylea, Vabysmo.   - Risks of endophthalmitis and vascular occlusive events and atrophic changes discussed with patient  - recommend IVA OS #1 today 04.08.25 for conversion to exudative disease with follow up in 4 weeks - pt wishes to be treated with IVA OS - RBA  of procedure discussed, questions answered - informed consent obtained and signed 04.08.25  - see procedure note  - f/u in 4 wks -- DFE/OCT, possible injection   3-5. Diabetes mellitus, type 2 without retinopathy  - A1C 7.1 (per patient) - The incidence, risk factors for progression, natural history and treatment options for diabetic retinopathy   were discussed with patient.   - The need for close monitoring of blood glucose, blood pressure, and serum lipids, avoiding cigarette or any type of tobacco, and the need for long term follow up was also discussed with patient. - f/u in 1 year, sooner prn    6,7. Hypertensive retinopathy OU - discussed importance of tight BP control - monitor   8. Mixed Cataract OU - The symptoms of cataract, surgical options, and treatments and risks were discussed with patient. - discussed diagnosis and progression - monitor   Ophthalmic Meds Ordered this visit:  Meds ordered this encounter  Medications   Bevacizumab (AVASTIN) SOLN 1.25 mg     Return in about 4 weeks (around 12/13/2023) for f/u Ex.AMD OU, DFE, OCT, Possible, IVA, OS.  There are no Patient Instructions on file for this visit.  Explained the diagnoses, plan, and follow up with the patient and they expressed understanding.  Patient expressed understanding of the importance of proper follow up care.   This document serves as a record of services personally performed by Karie Chimera, MD, PhD. It was created on their behalf by Charlette Caffey, COT an ophthalmic technician. The creation of this record is the provider's dictation and/or activities during the visit.    Electronically signed by:  Charlette Caffey, COT  11/15/23 10:22 PM  Karie Chimera, M.D., Ph.D. Diseases & Surgery of the Retina and Vitreous [facility] 11/15/2023  I have reviewed the above documentation for accuracy and completeness, and I agree with the above. Karie Chimera, M.D., Ph.D. 11/15/23 10:22 PM   Abbreviations: M myopia (nearsighted); A astigmatism; H hyperopia (farsighted); P presbyopia; Mrx spectacle prescription;  CTL contact lenses; OD right eye; OS left eye; OU both eyes  XT exotropia; ET esotropia; PEK punctate epithelial keratitis; PEE punctate epithelial erosions; DES dry eye syndrome; MGD meibomian gland dysfunction;  ATs artificial tears; PFAT's preservative free artificial tears; NSC nuclear sclerotic cataract; PSC posterior subcapsular cataract; ERM epi-retinal membrane; PVD posterior vitreous detachment; RD retinal detachment; DM diabetes mellitus; DR diabetic retinopathy; NPDR non-proliferative diabetic retinopathy; PDR proliferative diabetic retinopathy; CSME clinically significant macular edema; DME diabetic macular edema; dbh dot blot hemorrhages; CWS cotton wool spot; POAG primary open angle glaucoma; C/D cup-to-disc ratio; HVF humphrey visual field; GVF goldmann visual field; OCT optical coherence tomography; IOP intraocular pressure; BRVO Branch retinal vein occlusion; CRVO central retinal vein occlusion; CRAO central retinal artery occlusion; BRAO branch retinal artery occlusion; RT retinal tear; SB scleral buckle; PPV pars plana vitrectomy; VH Vitreous hemorrhage; PRP panretinal laser photocoagulation; IVK intravitreal kenalog; VMT vitreomacular traction; MH Macular hole;  NVD neovascularization of the disc; NVE neovascularization elsewhere; AREDS age related eye disease study; ARMD age related macular degeneration; POAG primary open angle glaucoma; EBMD epithelial/anterior basement membrane dystrophy; ACIOL anterior chamber intraocular lens; IOL intraocular lens; PCIOL posterior chamber intraocular lens; Phaco/IOL phacoemulsification with intraocular lens placement; PRK photorefractive keratectomy; LASIK laser assisted in situ keratomileusis; HTN hypertension; DM diabetes mellitus; COPD chronic obstructive pulmonary disease

## 2023-11-15 ENCOUNTER — Encounter (INDEPENDENT_AMBULATORY_CARE_PROVIDER_SITE_OTHER): Payer: Self-pay | Admitting: Ophthalmology

## 2023-11-15 ENCOUNTER — Ambulatory Visit (INDEPENDENT_AMBULATORY_CARE_PROVIDER_SITE_OTHER): Admitting: Ophthalmology

## 2023-11-15 DIAGNOSIS — H353213 Exudative age-related macular degeneration, right eye, with inactive scar: Secondary | ICD-10-CM

## 2023-11-15 DIAGNOSIS — Z7984 Long term (current) use of oral hypoglycemic drugs: Secondary | ICD-10-CM

## 2023-11-15 DIAGNOSIS — E119 Type 2 diabetes mellitus without complications: Secondary | ICD-10-CM | POA: Diagnosis not present

## 2023-11-15 DIAGNOSIS — H35033 Hypertensive retinopathy, bilateral: Secondary | ICD-10-CM

## 2023-11-15 DIAGNOSIS — I10 Essential (primary) hypertension: Secondary | ICD-10-CM

## 2023-11-15 DIAGNOSIS — H3581 Retinal edema: Secondary | ICD-10-CM

## 2023-11-15 DIAGNOSIS — H353221 Exudative age-related macular degeneration, left eye, with active choroidal neovascularization: Secondary | ICD-10-CM | POA: Diagnosis not present

## 2023-11-15 DIAGNOSIS — H25013 Cortical age-related cataract, bilateral: Secondary | ICD-10-CM

## 2023-11-15 DIAGNOSIS — Z7985 Long-term (current) use of injectable non-insulin antidiabetic drugs: Secondary | ICD-10-CM

## 2023-11-15 MED ORDER — BEVACIZUMAB CHEMO INJECTION 1.25MG/0.05ML SYRINGE FOR KALEIDOSCOPE
1.2500 mg | INTRAVITREAL | Status: AC | PRN
  Administered 2023-11-15: 1.25 mg via INTRAVITREAL

## 2023-11-22 ENCOUNTER — Other Ambulatory Visit: Payer: Self-pay | Admitting: Pharmacist

## 2023-11-22 NOTE — Progress Notes (Signed)
   11/22/2023  Patient ID: Anna Sweeney, female   DOB: April 22, 1951, 73 y.o.   MRN: [id number]  Called patient for another medication review today. Current concern is bowel movement issues.  Bowels are currently not providing a signal that she has an urge to go and then having an accident. Reports worsening over the past 3-4 months. Now wearing Poise/Depends just in case. Dr. Zelda Hickman told her to take Miralax. Helped with bowel movement the first time, but took longer for second use. Stopped using.  Recommended to take Miralax daily and schedule time to use the bathroom daily in attempts to train body for bowel movement. Feels like she is not fully clearly her bowels out when she goes. No foods identified that can cause diarrhea instead for bowel evacuation. Advised she can take Senna 1-2 times a week to see if that help completely evacuate her bowels. Having solid and regular stools. Asked about need to check for bowel obstruction, and said she is due for a colonoscopy this year anyways. Will hold off until after May to schedule something.  Could decrease Trulicity to see if this contributing to bowel issues as well.  Checked blood sugars. 93 while on the phone and said highest is 150 lately.   Advised [facility] had tried to call and schedule a visit with her 3 times now. Reports due to being in [place] she does not want to schedule for Prolia currently. Will defer Prolia for now and aware risk of bone fracture currently.  Follow-up in 3 weeks to see how bowel issues are going.    Delvin File, PharmD [facility]  Phone Number: [phone]

## 2023-11-29 NOTE — Progress Notes (Signed)
 [facility] - Clinic Note  12/13/2023   CHIEF COMPLAINT Patient presents for Retina Follow Up  HISTORY OF PRESENT ILLNESS: Hannah Odom is a 73 y.o. female who presents to the clinic today for:  HPI     Retina Follow Up   Patient presents with  Wet AMD.  In both eyes.  This started 4 weeks ago.  Duration of 4 weeks.  Since onset it is stable.  I, the attending physician,  performed the HPI with the patient and updated documentation appropriately.        Comments   4 week retina follow up and IVA OS pt is reporting vision seems about the same she has floaters denies any flashes      Last edited by Ronelle Coffee, MD on 12/13/2023 12:27 PM.     Patient states she has not noticed any improvement in vision, she is seeing black floaters since her first injection  Referring physician: Johana Musty, OD [address] [place],  [place] [address]  HISTORICAL INFORMATION:  Selected notes from the MEDICAL RECORD NUMBER Referred by Dr. Garland Junk for exu ARMD OS LEE:  Ocular Hx- history of ex ARMD OD s/p IVA OD x17, last in 07.10.12 Roxy Cordial at [facility]) H/o refractive surgery with Dr. Kirkland Peppers in early 2000s; h/o of sectoral retinitis pigmentosa OU PMH-   CURRENT MEDICATIONS: Current Outpatient Medications (Ophthalmic Drugs)  Medication Sig   brimonidine (ALPHAGAN P) 0.1 % SOLN Apply to eye.   No current facility-administered medications for this visit. (Ophthalmic Drugs)   Current Outpatient Medications (Other)  Medication Sig   acetaminophen (TYLENOL) 650 MG CR tablet Take by mouth.   ARIPiprazole (ABILIFY) 2 MG tablet Take by mouth.   calcipotriene (DOVONOX) 0.005 % cream Apply topically 2 (two) times daily.   Cholecalciferol 50 MCG (2000 UT) CAPS Take by mouth.   clobetasol cream (TEMOVATE) 0.05 % Apply topically 2 (two) times daily as needed.   DULoxetine (CYMBALTA) 60 MG capsule Take 60 mg by mouth 2 (two) times daily.   ezetimibe (ZETIA) 10 MG tablet Take  10 mg by mouth daily.   furosemide (LASIX) 40 MG tablet Take 40 mg by mouth daily.   hydrOXYzine (ATARAX) 50 MG tablet Take by mouth.   JARDIANCE 10 MG TABS tablet Take 10 mg by mouth daily.   levothyroxine (SYNTHROID) 25 MCG tablet TK 1 T PO D   linaclotide (LINZESS) 145 MCG CAPS capsule Take by mouth.   lisinopril (ZESTRIL) 20 MG tablet Take by mouth.   mirtazapine (REMERON) 30 MG tablet Take by mouth.   MOUNJARO 2.5 MG/0.5ML Pen SMARTSIG:2.5 Milligram(s) SUB-Q Once a Week   MYRBETRIQ 50 MG TB24 tablet Take 50 mg by mouth daily.   omeprazole (PRILOSEC) 40 MG capsule Take 40 mg by mouth daily.   pravastatin (PRAVACHOL) 40 MG tablet Take 40 mg by mouth at bedtime.   tiZANidine (ZANAFLEX) 4 MG tablet Take by mouth.   valACYclovir (VALTREX) 500 MG tablet Take 500 mg by mouth daily.   No current facility-administered medications for this visit. (Other)   REVIEW OF SYSTEMS: ROS   Positive for: Endocrine, Eyes, Allergic/Imm Last edited by Alise Appl, COT on 12/13/2023  8:44 AM.      ALLERGIES No Known Allergies PAST MEDICAL HISTORY Past Medical History:  Diagnosis Date   Diabetes mellitus without complication (HCC)    Hypertension    Macular degeneration    Past Surgical History:  Procedure Laterality Date  LASIK     FAMILY HISTORY History reviewed. No pertinent family history. SOCIAL HISTORY Social History   Tobacco Use   Smoking status: Never   Smokeless tobacco: Never  Vaping Use   Vaping status: Never Used  Substance Use Topics   Alcohol use: Never   Drug use: Never       OPHTHALMIC EXAM:  Base Eye Exam     Visual Acuity (Snellen - Linear)       Right Left   Dist [place] HM 20/150   Dist ph [place]  NI         Tonometry (Tonopen, 8:48 AM)       Right Left   Pressure 9 13         Pupils       Pupils Dark Light Shape React APD   Right PERRL 4 4 Round NR APD   Left PERRL 3 2 Round Brisk None         Extraocular Movement       Right  Left    Full, Ortho Full, Ortho         Neuro/Psych     Oriented x3: Yes   Mood/Affect: Normal         Dilation     Both eyes: 2.5% Phenylephrine @ 8:48 AM           Slit Lamp and Fundus Exam     External Exam       Right Left   External Normal Normal         Slit Lamp Exam       Right Left   Lids/Lashes Dermatochalasis - upper lid Dermatochalasis - upper lid   Conjunctiva/Sclera Trace Injection Trace Injection, temporal Pinguecula   Cornea Arcus, Debris in tear film, 4 cut RK Arcus, focal corneal haze/scar superior midzone   Anterior Chamber Deep and clear Deep and clear   Iris Round and dilated, No NVI Round and dilated, No NVI   Lens 3+ Nuclear sclerosis with brunescence, 3+ Cortical cataract 2-3+ Nuclear sclerosis, 2-3+ Cortical cataract   Anterior Vitreous Normal Posterior vitreous detachment         Fundus Exam       Right Left   Disc Hazy view, Pink and sharp Pink and sharp, mild PPP   C/D Ratio 0.2 0.2   Macula Flat, Blunted foveal reflex, central disciform scar with surrounding atropy and pigment clumping Blunted foveal reflex, CNV with edema and +heme nasal macula -- slightly improved   Vessels attenuated, Tortuous attenuated, Tortuous, focal pigment clumping along superior arcades   Periphery Attached, rare MA, pigmented CR atrophy nasal midzone Attached, No heme, mild reticular degeneration, focal bone spicules nasal quad and RPE atrophy           IMAGING AND PROCEDURES  Imaging and Procedures for 12/13/2023  OCT, Retina - OU - Both Eyes       Right Eye Quality was good. Central Foveal Thickness: 262. Progression has been stable. Findings include no IRF, no SRF, abnormal foveal contour, retinal drusen , outer retinal tubulation, subretinal hyper-reflective material, pigment epithelial detachment (Central disciform scar, no fluid, no DME).   Left Eye Quality was good. Central Foveal Thickness: 300. Progression has improved. Findings  include no IRF, abnormal foveal contour, retinal drusen , subretinal hyper-reflective material, pigment epithelial detachment, outer retinal atrophy (interval improvement in CNV/PED, SRHM, and edema nasal macula, no DME).   Notes *Images captured and stored on drive  Diagnosis / Impression:  Exudative ARMD OU OD: Central disciform scar, no fluid, no DME OS: interval improvement in CNV/PED, SRHM, and edema nasal macula, no DME  Clinical management:  See below  Abbreviations: NFP - Normal foveal profile. CME - cystoid macular edema. PED - pigment epithelial detachment. IRF - intraretinal fluid. SRF - subretinal fluid. EZ - ellipsoid zone. ERM - epiretinal membrane. ORA - outer retinal atrophy. ORT - outer retinal tubulation. SRHM - subretinal hyper-reflective material. IRHM - intraretinal hyper-reflective material      Intravitreal Injection, Pharmacologic Agent - OS - Left Eye       Time Out 12/13/2023. 9:26 AM. Confirmed correct patient, procedure, site, and patient consented.   Anesthesia Topical anesthesia was used. Anesthetic medications included Lidocaine 2%, Proparacaine 0.5%.   Procedure Preparation included 5% betadine to ocular surface, eyelid speculum. A supplied needle was used.   Injection: 1.25 mg Bevacizumab  1.25mg /0.65ml   Route: Intravitreal, Site: Left Eye   NDC: 16109-604-54, Lot: [id number] , Expiration date: 12/26/2023   Post-op Post injection exam found visual acuity of at least counting fingers. The patient tolerated the procedure well. There were no complications. The patient received written and verbal post procedure care education.            ASSESSMENT/PLAN:   ICD-10-CM   1. Exudative age-related macular degeneration of left eye with active choroidal neovascularization (HCC)  H35.3221 OCT, Retina - OU - Both Eyes    Intravitreal Injection, Pharmacologic Agent - OS - Left Eye    Bevacizumab  (AVASTIN ) SOLN 1.25 mg    2. Exudative age-related  macular degeneration of right eye with inactive scar (HCC)  H35.3213     3. Diabetes mellitus type 2 without retinopathy (HCC)  E11.9     4. Diabetes mellitus treated with oral medication (HCC)  E11.9    Z79.84     5. Diabetes mellitus treated with injections of non-insulin medication (HCC)  E11.9    Z79.85     6. Essential hypertension  I10     7. Hypertensive retinopathy of both eyes  H35.033     8. Cortical age-related cataract of both eyes  H25.013       1,2 .Exudative age related macular degeneration, OU  - OD chronic disciform scar / inactive  - OS newly converted  - previously managed at [facility] by Dr. Winn Havens  - s/p IVA OS #1 (04.08.25)  - h/o ex ARMD OD - s/p IVA OD x12, last injxn 2012 -- now w/ central disciform scar / atrophy  - OS with recent conversion -- pt reports decreased vision OS x several months  - BCVA OD HM, OS 20/100  - OCT OD: Central disciform scar, no fluid, no DME; OS: interval improvement in CNV/PED, SRHM, and edema nasal macula, no DM at 4 wks  - recommend IVA OS #2 today 05.06.25 with follow up in 4 weeks - pt wishes to be treated with IVA OS - RBA of procedure discussed, questions answered - informed consent obtained and signed 04.08.25  - see procedure note  - f/u in 4 wks -- DFE/OCT, possible injection   3-5. Diabetes mellitus, type 2 without retinopathy  - A1C 7.1 (per patient) - The incidence, risk factors for progression, natural history and treatment options for diabetic retinopathy  were discussed with patient.   - The need for close monitoring of blood glucose, blood pressure, and serum lipids, avoiding cigarette or any type of tobacco, and the need for long term follow up  was also discussed with patient. - f/u in 1 year, sooner prn    6,7. Hypertensive retinopathy OU - discussed importance of tight BP control - monitor   8. Mixed Cataract OU - The symptoms of cataract, surgical options, and treatments and  risks were discussed with patient. - discussed diagnosis and progression - monitor   Ophthalmic Meds Ordered this visit:  Meds ordered this encounter  Medications   Bevacizumab  (AVASTIN ) SOLN 1.25 mg     Return in about 4 weeks (around 01/10/2024) for f/u exu ARMD OU, DFE, OCT, Possible Injxn.  There are no Patient Instructions on file for this visit.  Explained the diagnoses, plan, and follow up with the patient and they expressed understanding.  Patient expressed understanding of the importance of proper follow up care.   This document serves as a record of services personally performed by Jeanice Millard, MD, PhD. It was created on their behalf by Olene Berne, COT an ophthalmic technician. The creation of this record is the provider's dictation and/or activities during the visit.    Electronically signed by:  Olene Berne, COT  12/13/23 12:29 PM  This document serves as a record of services personally performed by Jeanice Millard, MD, PhD. It was created on their behalf by Morley Arabia. Bevin Bucks, OA an ophthalmic technician. The creation of this record is the provider's dictation and/or activities during the visit.    Electronically signed by: Morley Arabia. Bevin Bucks, OA 12/13/23 12:29 PM  Jeanice Millard, M.D., Ph.D. Diseases & Surgery of the Retina and Vitreous [facility] 12/13/2023  I have reviewed the above documentation for accuracy and completeness, and I agree with the above. Jeanice Millard, M.D., Ph.D. 12/13/23 12:30 PM   Abbreviations: M myopia (nearsighted); A astigmatism; H hyperopia (farsighted); P presbyopia; Mrx spectacle prescription;  CTL contact lenses; OD right eye; OS left eye; OU both eyes  XT exotropia; ET esotropia; PEK punctate epithelial keratitis; PEE punctate epithelial erosions; DES dry eye syndrome; MGD meibomian gland dysfunction; ATs artificial tears; PFAT's preservative free artificial tears; NSC nuclear sclerotic cataract; PSC  posterior subcapsular cataract; ERM epi-retinal membrane; PVD posterior vitreous detachment; RD retinal detachment; DM diabetes mellitus; DR diabetic retinopathy; NPDR non-proliferative diabetic retinopathy; PDR proliferative diabetic retinopathy; CSME clinically significant macular edema; DME diabetic macular edema; dbh dot blot hemorrhages; CWS cotton wool spot; POAG primary open angle glaucoma; C/D cup-to-disc ratio; HVF humphrey visual field; GVF goldmann visual field; OCT optical coherence tomography; IOP intraocular pressure; BRVO Branch retinal vein occlusion; CRVO central retinal vein occlusion; CRAO central retinal artery occlusion; BRAO branch retinal artery occlusion; RT retinal tear; SB scleral buckle; PPV pars plana vitrectomy; VH Vitreous hemorrhage; PRP panretinal laser photocoagulation; IVK intravitreal kenalog; VMT vitreomacular traction; MH Macular hole;  NVD neovascularization of the disc; NVE neovascularization elsewhere; AREDS age related eye disease study; ARMD age related macular degeneration; POAG primary open angle glaucoma; EBMD epithelial/anterior basement membrane dystrophy; ACIOL anterior chamber intraocular lens; IOL intraocular lens; PCIOL posterior chamber intraocular lens; Phaco/IOL phacoemulsification with intraocular lens placement; PRK photorefractive keratectomy; LASIK laser assisted in situ keratomileusis; HTN hypertension; DM diabetes mellitus; COPD chronic obstructive pulmonary disease

## 2023-12-01 ENCOUNTER — Ambulatory Visit
Admission: RE | Admit: 2023-12-01 | Discharge: 2023-12-01 | Disposition: A | Discharge status: Home / Self Care | Source: Ambulatory Visit | Attending: Family Medicine | Admitting: Family Medicine

## 2023-12-01 DIAGNOSIS — Z1231 Encounter for screening mammogram for malignant neoplasm of breast: Secondary | ICD-10-CM | POA: Insufficient documentation

## 2023-12-05 ENCOUNTER — Other Ambulatory Visit: Payer: Self-pay | Admitting: Family Medicine

## 2023-12-05 DIAGNOSIS — E7800 Pure hypercholesterolemia, unspecified: Secondary | ICD-10-CM

## 2023-12-06 DIAGNOSIS — H40003 Preglaucoma, unspecified, bilateral: Secondary | ICD-10-CM | POA: Diagnosis not present

## 2023-12-13 ENCOUNTER — Encounter (INDEPENDENT_AMBULATORY_CARE_PROVIDER_SITE_OTHER): Payer: Self-pay | Admitting: Ophthalmology

## 2023-12-13 ENCOUNTER — Ambulatory Visit (INDEPENDENT_AMBULATORY_CARE_PROVIDER_SITE_OTHER): Admitting: Ophthalmology

## 2023-12-13 ENCOUNTER — Other Ambulatory Visit: Payer: Self-pay | Admitting: Pharmacist

## 2023-12-13 DIAGNOSIS — E119 Type 2 diabetes mellitus without complications: Secondary | ICD-10-CM | POA: Diagnosis not present

## 2023-12-13 DIAGNOSIS — H40003 Preglaucoma, unspecified, bilateral: Secondary | ICD-10-CM | POA: Diagnosis not present

## 2023-12-13 DIAGNOSIS — H2513 Age-related nuclear cataract, bilateral: Secondary | ICD-10-CM | POA: Diagnosis not present

## 2023-12-13 DIAGNOSIS — H35373 Puckering of macula, bilateral: Secondary | ICD-10-CM | POA: Diagnosis not present

## 2023-12-13 DIAGNOSIS — H353213 Exudative age-related macular degeneration, right eye, with inactive scar: Secondary | ICD-10-CM

## 2023-12-13 DIAGNOSIS — H353221 Exudative age-related macular degeneration, left eye, with active choroidal neovascularization: Secondary | ICD-10-CM

## 2023-12-13 DIAGNOSIS — Z7985 Long-term (current) use of injectable non-insulin antidiabetic drugs: Secondary | ICD-10-CM

## 2023-12-13 DIAGNOSIS — H25013 Cortical age-related cataract, bilateral: Secondary | ICD-10-CM

## 2023-12-13 DIAGNOSIS — H35033 Hypertensive retinopathy, bilateral: Secondary | ICD-10-CM

## 2023-12-13 DIAGNOSIS — I10 Essential (primary) hypertension: Secondary | ICD-10-CM

## 2023-12-13 DIAGNOSIS — Z7984 Long term (current) use of oral hypoglycemic drugs: Secondary | ICD-10-CM

## 2023-12-13 LAB — HM DIABETES EYE EXAM

## 2023-12-13 MED ORDER — BEVACIZUMAB CHEMO INJECTION 1.25MG/0.05ML SYRINGE FOR KALEIDOSCOPE
1.2500 mg | INTRAVITREAL | Status: AC | PRN
  Administered 2023-12-13: 1.25 mg via INTRAVITREAL

## 2023-12-13 NOTE — Progress Notes (Unsigned)
   12/13/2023  Patient ID: [name], female   DOB: 06-Dec-1950, 73 y.o.   MRN: [id number]  Called and spoke with the patient on the phone today. Currently at eye appointment. Requested to get callback tomorrow at 10AM.  5/7 visit note:  - Currently having 1 bowel movement per week - Tried: Miralax daily (did not help), Senna (vomiting and diarrhea at same time) - Unknown when they will occur as she is not getting a "signal" to go like usual lately; most signal is minor cramping - Due to this, has to wear Poise/Depends in case of an accident - Requesting medications to help with consistent bowel movement for 3 days a week - Sending message to Dr. Shann Darnel about trialing Lynnann Sartorius, PharmD [place]  Phone Number: [phone]

## 2023-12-15 ENCOUNTER — Telehealth: Payer: Self-pay | Admitting: Family Medicine

## 2023-12-15 NOTE — Progress Notes (Signed)
 Needs office visit to address

## 2023-12-15 NOTE — Telephone Encounter (Signed)
 Patient scheduled for 12/23/2023

## 2023-12-15 NOTE — Telephone Encounter (Signed)
 Needs office visit to address chronic constipation. Has appointment in July, or can schedule earlier appointment if she likes.

## 2023-12-15 NOTE — Telephone Encounter (Signed)
 Anna Sweeney, [place]  Lamon Pillow, MD Patient is requesting medication to help with regular bowel movements. Tried Miralax and Senna. Only option covered by insurance usually is Linzess. Please send if appropriate or have nurse schedule office visit to further assess. Thank you!

## 2023-12-16 ENCOUNTER — Encounter (HOSPITAL_COMMUNITY): Payer: Self-pay

## 2023-12-23 ENCOUNTER — Ambulatory Visit (INDEPENDENT_AMBULATORY_CARE_PROVIDER_SITE_OTHER): Admitting: Family Medicine

## 2023-12-23 ENCOUNTER — Encounter: Payer: Self-pay | Admitting: Family Medicine

## 2023-12-23 VITALS — BP 138/77 | HR 83 | Temp 97.6°F | Ht 67.0 in | Wt 147.6 lb

## 2023-12-23 DIAGNOSIS — K5909 Other constipation: Secondary | ICD-10-CM

## 2023-12-23 DIAGNOSIS — Z1211 Encounter for screening for malignant neoplasm of colon: Secondary | ICD-10-CM | POA: Diagnosis not present

## 2023-12-23 MED ORDER — LINACLOTIDE 145 MCG PO CAPS: 145.0000 ug | ORAL_CAPSULE | Freq: Every day | ORAL | 2 refills | Status: DC

## 2023-12-23 NOTE — Progress Notes (Signed)
 Established patient visit   Patient: Anna Sweeney   DOB: 1950/08/24   73 y.o. Female  MRN: [id number] Visit Date: 12/23/2023  Today's healthcare provider: Jeralene Mom, MD   Chief Complaint  Patient presents with   Constipation    Chronic problem. Patient requesting medication to help with bowel movements. Tried Miralax, colace and Senna. Only option covered by insurance usually is Linzess.   Subjective    Discussed the use of AI scribe software for clinical note transcription with the patient, who gave verbal consent to proceed.  History of Present Illness   Anna Sweeney is a 73 year old female who presents with chronic constipation.  She has experienced constipation for the past seven to eight months, characterized by a sensation of incomplete bowel evacuation. Even after a bowel movement, she feels the need to go again but is unable to. Occasionally, she has to wear protection as stool may pass involuntarily, although it is not runny.  She has tried various over-the-counter treatments including Miralax, Colace, and Senna, but reports no improvement. Senna caused significant gastrointestinal distress, making her feel 'sick as a dog'. She has also used Metamucil in the past without success.  No blood in her stool or black tarry stools. She has not undergone a colonoscopy but did a Cologuard test three years ago.  Her current medications include Trulicity , which she has been on since 2022 at a dose of 3.0mg . She notes that constipation was not an issue when she initially started this medication.       Medications: Outpatient Medications Prior to Visit  Medication Sig   amLODipine  (NORVASC ) 5 MG tablet Take 1 tablet (5 mg total) by mouth daily.   aspirin EC 81 MG tablet Take 81 mg by mouth daily.   betamethasone  dipropionate (DIPROLENE ) 0.05 % cream Apply topically 2 (two) times daily as needed.   Cholecalciferol (VITAMIN D -3) 125 MCG (5000 UT) TABS Take 5,000 Units by  mouth daily.   Continuous Glucose Sensor (FREESTYLE LIBRE 3 SENSOR) MISC 1 each by Does not apply route every 14 (fourteen) days.   cyclobenzaprine  (FLEXERIL ) 5 MG tablet TAKE 1-2 TABLETS BY MOUTH 3 TIMES DAILY AS NEEDED FOR MUSCLE SPASMS.   Dulaglutide  (TRULICITY ) 3 MG/0.5ML SOAJ Inject 3 mg as directed once a week.   glipiZIDE  (GLUCOTROL  XL) 2.5 MG 24 hr tablet TAKE 1 TABLET BY MOUTH EVERY DAY WITH BREAKFAST   ketoconazole  (NIZORAL ) 2 % cream Apply 1 Application topically 2 (two) times daily. Until clear   lovastatin  (MEVACOR ) 20 MG tablet TAKE 1 TABLET EVERY EVENING   meclizine  (ANTIVERT ) 12.5 MG tablet TAKE 1 TABLET (12.5 MG TOTAL) BY MOUTH 2 (TWO) TIMES DAILY AS NEEDED FOR DIZZINESS.   mupirocin  cream (BACTROBAN ) 2 % Apply 1 application topically 2 (two) times daily.   neomycin -polymyxin-hydrocortisone (CORTISPORIN) OTIC solution Place 3 drops into both ears 2 (two) times daily as needed (itching).   triamcinolone  cream (KENALOG ) 0.5 % Apply 1 application topically 2 (two) times daily.   verapamil  (CALAN -SR) 240 MG CR tablet TAKE 1 TABLET BY MOUTH AT BEDTIME.   TRUE METRIX BLOOD GLUCOSE TEST test strip TEST BLOOD SUGAR EVERY DAY (Patient not taking: Reported on 11/01/2023)   TRUEplus Lancets 33G MISC TEST BLOOD SUGAR EVERY DAY (Patient not taking: Reported on 11/01/2023)   No facility-administered medications prior to visit.      Objective    BP 138/77 (BP Location: Left Arm, Patient Position: Sitting, Cuff Size: Large)   Pulse  83   Temp 97.6 F (36.4 C) (Oral)   Ht 5\' 7"  (1.702 m)   Wt 147 lb 9.6 oz (67 kg)   SpO2 98%   BMI 23.12 kg/m   Physical Exam   General appearance: Well developed, well nourished female, cooperative and in no acute distress Head: Normocephalic, without obvious abnormality, atraumatic Respiratory: Respirations even and unlabored, normal respiratory rate Extremities: All extremities are intact.  Skin: Skin color, texture, turgor normal. No rashes seen   Psych: Appropriate mood and affect. Neurologic: Mental status: Alert, oriented to person, place, and time, thought content appropriate.    Assessment & Plan       Chronic constipation Chronic constipation for 7-8 months with limited response to Miralax, Colace, Senna, and Metamucil.  May be exacerbated by Trulicity , although has been on current dose since 2022.    - Prescribe Linzess to improve bowel movements. - Advise taking Linzess with Metamucil daily in the morning. - Is due for Cologuard test for CRC screening - Refer to gastroenterologist if symptoms persist.        Jeralene Mom, MD  [facility] [phone] (phone) [phone] (fax)  [facility]

## 2023-12-29 NOTE — Progress Notes (Signed)
 [facility] - Clinic Note  01/10/2024   CHIEF COMPLAINT Patient presents for Retina Follow Up  HISTORY OF PRESENT ILLNESS: Hannah Odom is a 73 y.o. female who presents to the clinic today for:  HPI     Retina Follow Up   Patient presents with  Wet AMD.  In both eyes.  This started 4 weeks ago.  I, the attending physician,  performed the HPI with the patient and updated documentation appropriately.        Comments   Patient here for 4 weeks retina follow up for exu ARMD OU. Patient states vision not good. Few days couldn't hardly see. Some days are good. No eye pain.       Last edited by Ronelle Coffee, MD on 01/10/2024  9:22 AM.      Patient states some days her vision seems pretty good and other days she feels like she can't see at all  Referring physician: Johana Musty, OD [address] [place],  [place] [address]  HISTORICAL INFORMATION:  Selected notes from the MEDICAL RECORD NUMBER Referred by Dr. Garland Junk for exu ARMD OS LEE:  Ocular Hx- history of ex ARMD OD s/p IVA OD x17, last in 07.10.12 Roxy Cordial at [facility]) H/o refractive surgery with Dr. Kirkland Peppers in early 2000s; h/o of sectoral retinitis pigmentosa OU PMH-   CURRENT MEDICATIONS: Current Outpatient Medications (Ophthalmic Drugs)  Medication Sig   brimonidine (ALPHAGAN P) 0.1 % SOLN Apply to eye.   No current facility-administered medications for this visit. (Ophthalmic Drugs)   Current Outpatient Medications (Other)  Medication Sig   acetaminophen (TYLENOL) 650 MG CR tablet Take by mouth.   ARIPiprazole (ABILIFY) 2 MG tablet Take by mouth.   calcipotriene (DOVONOX) 0.005 % cream Apply topically 2 (two) times daily.   Cholecalciferol 50 MCG (2000 UT) CAPS Take by mouth.   clobetasol cream (TEMOVATE) 0.05 % Apply topically 2 (two) times daily as needed.   DULoxetine (CYMBALTA) 60 MG capsule Take 60 mg by mouth 2 (two) times daily.   ezetimibe (ZETIA) 10 MG tablet Take 10 mg by mouth  daily.   furosemide (LASIX) 40 MG tablet Take 40 mg by mouth daily.   hydrOXYzine (ATARAX) 50 MG tablet Take by mouth.   JARDIANCE 10 MG TABS tablet Take 10 mg by mouth daily.   levothyroxine (SYNTHROID) 25 MCG tablet TK 1 T PO D   linaclotide (LINZESS) 145 MCG CAPS capsule Take by mouth.   lisinopril (ZESTRIL) 20 MG tablet Take by mouth.   mirtazapine (REMERON) 30 MG tablet Take by mouth.   MOUNJARO 2.5 MG/0.5ML Pen SMARTSIG:2.5 Milligram(s) SUB-Q Once a Week   MYRBETRIQ 50 MG TB24 tablet Take 50 mg by mouth daily.   omeprazole (PRILOSEC) 40 MG capsule Take 40 mg by mouth daily.   pravastatin (PRAVACHOL) 40 MG tablet Take 40 mg by mouth at bedtime.   tiZANidine (ZANAFLEX) 4 MG tablet Take by mouth.   valACYclovir (VALTREX) 500 MG tablet Take 500 mg by mouth daily.   No current facility-administered medications for this visit. (Other)   REVIEW OF SYSTEMS: ROS   Positive for: Endocrine, Eyes, Allergic/Imm Last edited by Sylvan Evener, COA on 01/10/2024  8:36 AM.     ALLERGIES No Known Allergies PAST MEDICAL HISTORY Past Medical History:  Diagnosis Date   Diabetes mellitus without complication (HCC)    Hypertension    Macular degeneration    Past Surgical History:  Procedure Laterality Date  LASIK     FAMILY HISTORY History reviewed. No pertinent family history. SOCIAL HISTORY Social History   Tobacco Use   Smoking status: Never   Smokeless tobacco: Never  Vaping Use   Vaping status: Never Used  Substance Use Topics   Alcohol use: Never   Drug use: Never       OPHTHALMIC EXAM:  Base Eye Exam     Visual Acuity (Snellen - Linear)       Right Left   Dist [place] CF at 3' 20/80 -2   Dist ph [place]  20/50 -2         Tonometry (Tonopen, 8:32 AM)       Right Left   Pressure 15 13         Pupils       Dark Light Shape React APD   Right 4 4 Round NR APD   Left 3 2 Round Brisk None         Visual Fields (Counting fingers)       Left Right    Full     Restrictions  Total superior temporal, inferior temporal, superior nasal, inferior nasal deficiencies         Extraocular Movement       Right Left    Full, Ortho Full, Ortho         Neuro/Psych     Oriented x3: Yes   Mood/Affect: Normal         Dilation     Both eyes: 1.0% Mydriacyl, 2.5% Phenylephrine @ 8:32 AM           Slit Lamp and Fundus Exam     External Exam       Right Left   External Normal Normal         Slit Lamp Exam       Right Left   Lids/Lashes Dermatochalasis - upper lid Dermatochalasis - upper lid   Conjunctiva/Sclera Trace Injection Trace Injection, temporal Pinguecula   Cornea Arcus, Debris in tear film, 4 cut RK Arcus, focal corneal haze/scar superior midzone   Anterior Chamber Deep and clear Deep and clear   Iris Round and dilated, No NVI Round and dilated, No NVI   Lens 3+ Nuclear sclerosis with brunescence, 3+ Cortical cataract 2-3+ Nuclear sclerosis, 2-3+ Cortical cataract   Anterior Vitreous Normal Posterior vitreous detachment         Fundus Exam       Right Left   Disc Hazy view, Pink and sharp Pink and sharp, mild PPP   C/D Ratio 0.2 0.2   Macula Flat, Blunted foveal reflex, central disciform scar with surrounding atropy and pigment clumping Blunted foveal reflex, CNV with edema and +heme nasal macula -- slightly improved   Vessels attenuated, Tortuous attenuated, Tortuous, focal pigment clumping along superior arcades   Periphery Attached, rare MA, pigmented CR atrophy nasal midzone Attached, No heme, mild reticular degeneration, focal bone spicules nasal quad and RPE atrophy           IMAGING AND PROCEDURES  Imaging and Procedures for 01/10/2024  OCT, Retina - OU - Both Eyes        Right Eye Quality was good. Central Foveal Thickness: 268. Progression has been stable. Findings include no IRF, no SRF, abnormal foveal contour, retinal drusen , outer retinal tubulation, subretinal hyper-reflective material, pigment  epithelial detachment (Central disciform scar, no fluid, no DME).   Left Eye Quality was good. Central Foveal Thickness: 300. Progression has improved.  Findings include no IRF, abnormal foveal contour, retinal drusen , subretinal hyper-reflective material, pigment epithelial detachment, outer retinal atrophy (Mild interval improvement in CNV/PED, SRHM and edema nasal macula, no DME).   Notes  *Images captured and stored on drive  Diagnosis / Impression:  Exudative ARMD OU OD: Central disciform scar, no fluid, no DME OS: mild interval improvement in CNV/PED, SRHM, and edema nasal macula, no DME  Clinical management:  See below  Abbreviations: NFP - Normal foveal profile. CME - cystoid macular edema. PED - pigment epithelial detachment. IRF - intraretinal fluid. SRF - subretinal fluid. EZ - ellipsoid zone. ERM - epiretinal membrane. ORA - outer retinal atrophy. ORT - outer retinal tubulation. SRHM - subretinal hyper-reflective material. IRHM - intraretinal hyper-reflective material      Intravitreal Injection, Pharmacologic Agent - OS - Left Eye       Time Out 01/10/2024. 9:05 AM. Confirmed correct patient, procedure, site, and patient consented.   Anesthesia Topical anesthesia was used. Anesthetic medications included Lidocaine 2%, Proparacaine 0.5%.   Procedure Preparation included 5% betadine to ocular surface, eyelid speculum. A (32g) needle was used.   Injection: 1.25 mg Bevacizumab  1.25mg /0.69ml   Route: Intravitreal, Site: Left Eye   NDC: [id number], Lot: [id number], Expiration date: 03/13/2024   Post-op Post injection exam found visual acuity of at least counting fingers. The patient tolerated the procedure well. There were no complications. The patient received written and verbal post procedure care education.           ASSESSMENT/PLAN:   ICD-10-CM   1. Exudative age-related macular degeneration of left eye with active choroidal neovascularization (HCC)  H35.3221  OCT, Retina - OU - Both Eyes    Intravitreal Injection, Pharmacologic Agent - OS - Left Eye    Bevacizumab  (AVASTIN ) SOLN 1.25 mg    2. Exudative age-related macular degeneration of right eye with inactive scar (HCC)  H35.3213     3. Diabetes mellitus type 2 without retinopathy (HCC)  E11.9     4. Diabetes mellitus treated with oral medication (HCC)  E11.9    Z79.84     5. Diabetes mellitus treated with injections of non-insulin medication (HCC)  E11.9    Z79.85     6. Essential hypertension  I10     7. Hypertensive retinopathy of both eyes  H35.033     8. Cortical age-related cataract of both eyes  H25.013      1,2 .Exudative age related macular degeneration, OU  - OD chronic disciform scar / inactive  - OS newly converted  - previously managed at [facility] by Dr. Winn Havens  - s/p IVA OS #1 (04.08.25), #2 (05.06.25)  - h/o ex ARMD OD - s/p IVA OD x12, last injxn 2012 -- now w/ central disciform scar / atrophy  - OS with recent conversion -- pt reports decreased vision OS x several months  - BCVA OD improved to CF from HM, OS improved to 20/50 from 20/100  - OCT OD: Central disciform scar, no fluid, no DME; OS: interval improvement in CNV/PED, SRHM, and edema nasal macula, no DM at 4 wks  - recommend IVA OS #3 today 06.03.25 with follow up in 4 weeks - pt wishes to be treated with IVA OS - RBA of procedure discussed, questions answered - informed consent obtained and signed 04.08.25  - see procedure note  - f/u in 4 wks -- DFE/OCT, possible injection   3-5. Diabetes mellitus, type 2 without retinopathy  -  A1C 7.1 (per patient) - The incidence, risk factors for progression, natural history and treatment options for diabetic retinopathy  were discussed with patient.   - The need for close monitoring of blood glucose, blood pressure, and serum lipids, avoiding cigarette or any type of tobacco, and the need for long term follow up was also discussed with  patient. - f/u in 1 year, sooner prn    6,7. Hypertensive retinopathy OU - discussed importance of tight BP control - monitor   8. Mixed Cataract OU - The symptoms of cataract, surgical options, and treatments and risks were discussed with patient. - discussed diagnosis and progression - will refer to Dr. Candi Chafe for cataract consult  Ophthalmic Meds Ordered this visit:  Meds ordered this encounter  Medications   Bevacizumab  (AVASTIN ) SOLN 1.25 mg     Return in about 4 weeks (around 02/07/2024) for f/u exu ARMD OU, DFE, OCT, Possible Injxn.  There are no Patient Instructions on file for this visit.  Explained the diagnoses, plan, and follow up with the patient and they expressed understanding.  Patient expressed understanding of the importance of proper follow up care.   This document serves as a record of services personally performed by Jeanice Millard, MD, PhD. It was created on their behalf by Olene Berne, COT an ophthalmic technician. The creation of this record is the provider's dictation and/or activities during the visit.    Electronically signed by:  Olene Berne, COT  01/11/24 12:05 PM  This document serves as a record of services personally performed by Jeanice Millard, MD, PhD. It was created on their behalf by Morley Arabia. Bevin Bucks, OA an ophthalmic technician. The creation of this record is the provider's dictation and/or activities during the visit.    Electronically signed by: Morley Arabia. Bevin Bucks, OA 01/11/24 12:05 PM  Jeanice Millard, M.D., Ph.D. Diseases & Surgery of the Retina and Vitreous [facility] 01/10/2024  I have reviewed the above documentation for accuracy and completeness, and I agree with the above. Jeanice Millard, M.D., Ph.D. 01/11/24 12:16 PM   Abbreviations: M myopia (nearsighted); A astigmatism; H hyperopia (farsighted); P presbyopia; Mrx spectacle prescription;  CTL contact lenses; OD right eye; OS left eye; OU both  eyes  XT exotropia; ET esotropia; PEK punctate epithelial keratitis; PEE punctate epithelial erosions; DES dry eye syndrome; MGD meibomian gland dysfunction; ATs artificial tears; PFAT's preservative free artificial tears; NSC nuclear sclerotic cataract; PSC posterior subcapsular cataract; ERM epi-retinal membrane; PVD posterior vitreous detachment; RD retinal detachment; DM diabetes mellitus; DR diabetic retinopathy; NPDR non-proliferative diabetic retinopathy; PDR proliferative diabetic retinopathy; CSME clinically significant macular edema; DME diabetic macular edema; dbh dot blot hemorrhages; CWS cotton wool spot; POAG primary open angle glaucoma; C/D cup-to-disc ratio; HVF humphrey visual field; GVF goldmann visual field; OCT optical coherence tomography; IOP intraocular pressure; BRVO Branch retinal vein occlusion; CRVO central retinal vein occlusion; CRAO central retinal artery occlusion; BRAO branch retinal artery occlusion; RT retinal tear; SB scleral buckle; PPV pars plana vitrectomy; VH Vitreous hemorrhage; PRP panretinal laser photocoagulation; IVK intravitreal kenalog; VMT vitreomacular traction; MH Macular hole;  NVD neovascularization of the disc; NVE neovascularization elsewhere; AREDS age related eye disease study; ARMD age related macular degeneration; POAG primary open angle glaucoma; EBMD epithelial/anterior basement membrane dystrophy; ACIOL anterior chamber intraocular lens; IOL intraocular lens; PCIOL posterior chamber intraocular lens; Phaco/IOL phacoemulsification with intraocular lens placement; PRK photorefractive keratectomy; LASIK laser assisted in situ keratomileusis; HTN hypertension; DM  diabetes mellitus; COPD chronic obstructive pulmonary disease

## 2024-01-10 ENCOUNTER — Ambulatory Visit (INDEPENDENT_AMBULATORY_CARE_PROVIDER_SITE_OTHER): Admitting: Ophthalmology

## 2024-01-10 ENCOUNTER — Encounter (INDEPENDENT_AMBULATORY_CARE_PROVIDER_SITE_OTHER): Payer: Self-pay | Admitting: Ophthalmology

## 2024-01-10 DIAGNOSIS — N184 Chronic kidney disease, stage 4 (severe): Secondary | ICD-10-CM | POA: Diagnosis not present

## 2024-01-10 DIAGNOSIS — E1122 Type 2 diabetes mellitus with diabetic chronic kidney disease: Secondary | ICD-10-CM | POA: Diagnosis not present

## 2024-01-10 DIAGNOSIS — N281 Cyst of kidney, acquired: Secondary | ICD-10-CM | POA: Diagnosis not present

## 2024-01-10 DIAGNOSIS — I10 Essential (primary) hypertension: Secondary | ICD-10-CM | POA: Diagnosis not present

## 2024-01-10 DIAGNOSIS — H353213 Exudative age-related macular degeneration, right eye, with inactive scar: Secondary | ICD-10-CM

## 2024-01-10 DIAGNOSIS — Z7984 Long term (current) use of oral hypoglycemic drugs: Secondary | ICD-10-CM | POA: Diagnosis not present

## 2024-01-10 DIAGNOSIS — E119 Type 2 diabetes mellitus without complications: Secondary | ICD-10-CM | POA: Diagnosis not present

## 2024-01-10 DIAGNOSIS — H25013 Cortical age-related cataract, bilateral: Secondary | ICD-10-CM

## 2024-01-10 DIAGNOSIS — H35033 Hypertensive retinopathy, bilateral: Secondary | ICD-10-CM | POA: Diagnosis not present

## 2024-01-10 DIAGNOSIS — H353221 Exudative age-related macular degeneration, left eye, with active choroidal neovascularization: Secondary | ICD-10-CM | POA: Diagnosis not present

## 2024-01-10 DIAGNOSIS — Z7985 Long-term (current) use of injectable non-insulin antidiabetic drugs: Secondary | ICD-10-CM

## 2024-01-10 MED ORDER — BEVACIZUMAB CHEMO INJECTION 1.25MG/0.05ML SYRINGE FOR KALEIDOSCOPE
1.2500 mg | INTRAVITREAL | Status: AC | PRN
  Administered 2024-01-10: 1.25 mg via INTRAVITREAL

## 2024-01-11 DIAGNOSIS — N184 Chronic kidney disease, stage 4 (severe): Secondary | ICD-10-CM | POA: Diagnosis not present

## 2024-01-11 DIAGNOSIS — E1122 Type 2 diabetes mellitus with diabetic chronic kidney disease: Secondary | ICD-10-CM | POA: Diagnosis not present

## 2024-01-11 DIAGNOSIS — I10 Essential (primary) hypertension: Secondary | ICD-10-CM | POA: Diagnosis not present

## 2024-01-11 LAB — MICROALBUMIN, URINE: Microalb, Ur: 0.8

## 2024-01-11 LAB — MICROALBUMIN / CREATININE URINE RATIO: Microalb Creat Ratio: 7

## 2024-01-11 LAB — PROTEIN / CREATININE RATIO, URINE: Creatinine, Urine: 114

## 2024-01-12 LAB — BASIC METABOLIC PANEL WITH GFR
BUN: 29 — AB (ref 4–21)
Creatinine: 2.2 — AB (ref 0.5–1.1)
Potassium: 4.1 meq/L (ref 3.5–5.1)
Sodium: 138 (ref 137–147)

## 2024-01-12 LAB — COMPREHENSIVE METABOLIC PANEL WITH GFR
Calcium: 9.5 (ref 8.7–10.7)
eGFR: 23

## 2024-02-02 NOTE — Progress Notes (Signed)
 [facility] - Clinic Note  02/07/2024   CHIEF COMPLAINT Patient presents for Retina Follow Up  HISTORY OF PRESENT ILLNESS: Hannah Odom is a 73 y.o. female who presents to the clinic today for:  HPI     Retina Follow Up   Patient presents with  Wet AMD.  In both eyes.  This started 4 weeks ago.  Duration of 4 weeks.  Since onset it is stable.  I, the attending physician,  performed the HPI with the patient and updated documentation appropriately.        Comments   4 week retina follow up ARMD and IVA OU pt is reporting no vision changes noticed she denies any flashes some floaters she is having cataract surgery next week with Dr Octavia OD first last reading 144 yesterday       Last edited by Valdemar Rogue, MD on 02/07/2024  9:27 AM.    Patient states the first week after her last injection she felt like her vision was improved and the second week it was okay, but then it went back down, she is having cataract sx OD next week  Referring physician: [name], OD [address] [place],  [place] [address]  HISTORICAL INFORMATION:  Selected notes from the MEDICAL RECORD NUMBER Referred by Dr. Nivia for exu ARMD OS LEE:  Ocular Hx- history of ex ARMD OD s/p IVA OD x17, last in 07.10.12 MICHAELL Pugh at [facility]) H/o refractive surgery with Dr. Meridee in early 2000s; h/o of sectoral retinitis pigmentosa OU PMH-   CURRENT MEDICATIONS: Current Outpatient Medications (Ophthalmic Drugs)  Medication Sig   brimonidine (ALPHAGAN P) 0.1 % SOLN Apply to eye.   No current facility-administered medications for this visit. (Ophthalmic Drugs)   Current Outpatient Medications (Other)  Medication Sig   acetaminophen (TYLENOL) 650 MG CR tablet Take by mouth.   ARIPiprazole (ABILIFY) 2 MG tablet Take by mouth.   calcipotriene (DOVONOX) 0.005 % cream Apply topically 2 (two) times daily.   Cholecalciferol 50 MCG (2000 UT) CAPS Take by mouth.   clobetasol cream (TEMOVATE)  0.05 % Apply topically 2 (two) times daily as needed.   DULoxetine (CYMBALTA) 60 MG capsule Take 60 mg by mouth 2 (two) times daily.   ezetimibe (ZETIA) 10 MG tablet Take 10 mg by mouth daily.   furosemide (LASIX) 40 MG tablet Take 40 mg by mouth daily.   hydrOXYzine (ATARAX) 50 MG tablet Take by mouth.   JARDIANCE 10 MG TABS tablet Take 10 mg by mouth daily.   levothyroxine (SYNTHROID) 25 MCG tablet TK 1 T PO D   linaclotide (LINZESS) 145 MCG CAPS capsule Take by mouth.   lisinopril (ZESTRIL) 20 MG tablet Take by mouth.   mirtazapine (REMERON) 30 MG tablet Take by mouth.   MOUNJARO 2.5 MG/0.5ML Pen SMARTSIG:2.5 Milligram(s) SUB-Q Once a Week   MYRBETRIQ 50 MG TB24 tablet Take 50 mg by mouth daily.   omeprazole (PRILOSEC) 40 MG capsule Take 40 mg by mouth daily.   pravastatin (PRAVACHOL) 40 MG tablet Take 40 mg by mouth at bedtime.   tiZANidine (ZANAFLEX) 4 MG tablet Take by mouth.   valACYclovir (VALTREX) 500 MG tablet Take 500 mg by mouth daily.   No current facility-administered medications for this visit. (Other)   REVIEW OF SYSTEMS: ROS   Positive for: Endocrine, Eyes, Allergic/Imm Last edited by Resa Delon ORN, COT on 02/07/2024  7:58 AM.     ALLERGIES No Known Allergies  PAST MEDICAL HISTORY Past Medical History:  Diagnosis Date   Diabetes mellitus without complication (HCC)    Hypertension    Macular degeneration    Past Surgical History:  Procedure Laterality Date   LASIK     FAMILY HISTORY History reviewed. No pertinent family history. SOCIAL HISTORY Social History   Tobacco Use   Smoking status: Never   Smokeless tobacco: Never  Vaping Use   Vaping status: Never Used  Substance Use Topics   Alcohol use: Never   Drug use: Never       OPHTHALMIC EXAM:  Base Eye Exam     Visual Acuity (Snellen - Linear)       Right Left   Dist [place] CF at 3' 20/70   Dist ph [place] NI 20/80         Tonometry (Tonopen, 7:56 AM)       Right Left   Pressure 13  15         Pupils       Pupils Dark Light Shape React APD   Right PERRL 4 4 Round NR APD   Left PERRL 3 2 Round Brisk None         Visual Fields       Left Right   Restrictions  Total superior temporal, inferior temporal, superior nasal, inferior nasal deficiencies         Extraocular Movement       Right Left    Full, Ortho Full, Ortho         Neuro/Psych     Oriented x3: Yes   Mood/Affect: Normal         Dilation     Both eyes: 2.5% Phenylephrine @ 7:56 AM           Slit Lamp and Fundus Exam     External Exam       Right Left   External Normal Normal         Slit Lamp Exam       Right Left   Lids/Lashes Dermatochalasis - upper lid Dermatochalasis - upper lid   Conjunctiva/Sclera Trace Injection temporal Pinguecula   Cornea 4 cut RK, 2+ Punctate epithelial erosions, arcus, punctate corneal haze Arcus, focal corneal haze/scar superior midzone, tear film debris, 1+ Punctate epithelial erosions   Anterior Chamber Deep and clear Deep and clear   Iris Round and dilated, No NVI Round and dilated, No NVI   Lens 4+ Nuclear sclerosis with brunescence, 3+ Cortical cataract 3-4+ Nuclear sclerosis, 2-3+ Cortical cataract   Anterior Vitreous Normal Posterior vitreous detachment         Fundus Exam       Right Left   Disc Hazy view, Pink and sharp Pink and sharp, mild PPP   C/D Ratio 0.2 0.2   Macula Flat, Blunted foveal reflex, central disciform scar with surrounding atropy and pigment clumping Blunted foveal reflex, CNV with edema and +heme nasal macula -- slightly improved   Vessels attenuated, mild tortuosity attenuated, Tortuous, focal pigment clumping along superior arcades   Periphery Attached, rare MA, pigmented CR atrophy nasal midzone Attached, No heme, mild reticular degeneration, focal bone spicules nasal quad and RPE atrophy           IMAGING AND PROCEDURES  Imaging and Procedures for 02/07/2024  OCT, Retina - OU - Both Eyes        Right Eye Quality was good. Central Foveal Thickness: 273. Progression has been stable. Findings include no IRF, no  SRF, abnormal foveal contour, retinal drusen , outer retinal tubulation, subretinal hyper-reflective material, pigment epithelial detachment (Central disciform scar, no fluid, no DME).   Left Eye Quality was good. Central Foveal Thickness: 301. Progression has improved. Findings include no IRF, abnormal foveal contour, retinal drusen , subretinal hyper-reflective material, pigment epithelial detachment, outer retinal atrophy (Mild interval improvement in CNV/PED, SRHM and edema nasal macula, no DME).   Notes *Images captured and stored on drive  Diagnosis / Impression:  Exudative ARMD OU OD: Central disciform scar, no fluid, no DME OS: mild interval improvement in CNV/PED, SRHM, and edema nasal macula, no DME  Clinical management:  See below  Abbreviations: NFP - Normal foveal profile. CME - cystoid macular edema. PED - pigment epithelial detachment. IRF - intraretinal fluid. SRF - subretinal fluid. EZ - ellipsoid zone. ERM - epiretinal membrane. ORA - outer retinal atrophy. ORT - outer retinal tubulation. SRHM - subretinal hyper-reflective material. IRHM - intraretinal hyper-reflective material      Intravitreal Injection, Pharmacologic Agent - OS - Left Eye       Time Out 02/07/2024. 8:57 AM. Confirmed correct patient, procedure, site, and patient consented.   Anesthesia Topical anesthesia was used. Anesthetic medications included Lidocaine 2%, Proparacaine 0.5%.   Procedure Preparation included 5% betadine to ocular surface, eyelid speculum. A supplied (32g) needle was used.   Injection: 1.25 mg Bevacizumab  1.25mg /0.72ml   Route: Intravitreal, Site: Left Eye   NDC: [id number], Lot: [id number], Expiration date: 03/25/2024   Post-op Post injection exam found visual acuity of at least counting fingers. The patient tolerated the procedure well. There were no  complications. The patient received written and verbal post procedure care education.            ASSESSMENT/PLAN:   ICD-10-CM   1. Exudative age-related macular degeneration of left eye with active choroidal neovascularization (HCC)  H35.3221 OCT, Retina - OU - Both Eyes    Intravitreal Injection, Pharmacologic Agent - OS - Left Eye    Bevacizumab  (AVASTIN ) SOLN 1.25 mg    2. Exudative age-related macular degeneration of right eye with inactive scar (HCC)  H35.3213     3. Diabetes mellitus type 2 without retinopathy (HCC)  E11.9     4. Diabetes mellitus treated with oral medication (HCC)  E11.9    Z79.84     5. Diabetes mellitus treated with injections of non-insulin medication (HCC)  E11.9    Z79.85     6. Essential hypertension  I10     7. Hypertensive retinopathy of both eyes  H35.033     8. Combined forms of age-related cataract of both eyes  H25.813      1,2 .Exudative age related macular degeneration, OU  - OD chronic disciform scar / inactive  - OS more recently converted  - previously managed at [facility] by Dr. Cheree  - s/p IVA OS #1 (04.08.25), #2 (05.06.25), #3 (06.03.25)  - h/o ex ARMD OD - s/p IVA OD x12, last injxn 2012 -- now w/ central disciform scar / atrophy  - OS with recent conversion -- pt reports decreased vision OS x several months  - BCVA OD stable at [facility], OS decreased to 20/80 from 20/50   **discussed decreased efficacy / resistance to Avastin  and potential benefit of switching from Avastin  to Eylea**  - OCT OD: Central disciform scar, no fluid, no DME; OS: interval improvement in CNV/PED, SRHM, and edema nasal macula, no DM at 4 wks  - recommend IVA  OS #4 today 07.01.25 with follow up in 4 weeks - pt wishes to be treated with IVA OS - RBA of procedure discussed, questions answered - informed consent obtained and signed 04.08.25  - see procedure note - will check Eylea auth for next visit  - f/u in 4 wks -- DFE/OCT, possible  injection   3-5. Diabetes mellitus, type 2 without retinopathy  - A1C 7.6 on 06.12.25 - The incidence, risk factors for progression, natural history and treatment options for diabetic retinopathy  were discussed with patient.   - The need for close monitoring of blood glucose, blood pressure, and serum lipids, avoiding cigarette or any type of tobacco, and the need for long term follow up was also discussed with patient. - f/u in 1 year, sooner prn   6,7. Hypertensive retinopathy OU - discussed importance of tight BP control - monitor  8. Mixed Cataract OU - The symptoms of cataract, surgical options, and treatments and risks were discussed with patient. - discussed diagnosis and progression - OD surgery scheduled for next week  Ophthalmic Meds Ordered this visit:  Meds ordered this encounter  Medications   Bevacizumab  (AVASTIN ) SOLN 1.25 mg     Return in about 4 weeks (around 03/06/2024) for f/u exu ARMD OU, DFE, OCT, Possible Injxn.  There are no Patient Instructions on file for this visit.  Explained the diagnoses, plan, and follow up with the patient and they expressed understanding.  Patient expressed understanding of the importance of proper follow up care.   This document serves as a record of services personally performed by Redell JUDITHANN Hans, MD, PhD. It was created on their behalf by Auston Muzzy, COMT. The creation of this record is the provider's dictation and/or activities during the visit.  Electronically signed by: Auston Muzzy, COMT 02/07/24 9:34 AM  This document serves as a record of services personally performed by Redell JUDITHANN Hans, MD, PhD. It was created on their behalf by Alan PARAS. Delores, OA an ophthalmic technician. The creation of this record is the provider's dictation and/or activities during the visit.    Electronically signed by: Alan PARAS. Delores, OA 02/07/24 9:34 AM  Redell JUDITHANN Hans, M.D., Ph.D. Diseases & Surgery of the Retina and Vitreous [facility]  I have reviewed the above documentation for accuracy and completeness, and I agree with the above. Redell JUDITHANN Hans, M.D., Ph.D. 02/07/24 9:37 AM   Abbreviations: M myopia (nearsighted); A astigmatism; H hyperopia (farsighted); P presbyopia; Mrx spectacle prescription;  CTL contact lenses; OD right eye; OS left eye; OU both eyes  XT exotropia; ET esotropia; PEK punctate epithelial keratitis; PEE punctate epithelial erosions; DES dry eye syndrome; MGD meibomian gland dysfunction; ATs artificial tears; PFAT's preservative free artificial tears; NSC nuclear sclerotic cataract; PSC posterior subcapsular cataract; ERM epi-retinal membrane; PVD posterior vitreous detachment; RD retinal detachment; DM diabetes mellitus; DR diabetic retinopathy; NPDR non-proliferative diabetic retinopathy; PDR proliferative diabetic retinopathy; CSME clinically significant macular edema; DME diabetic macular edema; dbh dot blot hemorrhages; CWS cotton wool spot; POAG primary open angle glaucoma; C/D cup-to-disc ratio; HVF humphrey visual field; GVF goldmann visual field; OCT optical coherence tomography; IOP intraocular pressure; BRVO Branch retinal vein occlusion; CRVO central retinal vein occlusion; CRAO central retinal artery occlusion; BRAO branch retinal artery occlusion; RT retinal tear; SB scleral buckle; PPV pars plana vitrectomy; VH Vitreous hemorrhage; PRP panretinal laser photocoagulation; IVK intravitreal kenalog; VMT vitreomacular traction; MH Macular hole;  NVD neovascularization of the disc; NVE neovascularization  elsewhere; AREDS age related eye disease study; ARMD age related macular degeneration; POAG primary open angle glaucoma; EBMD epithelial/anterior basement membrane dystrophy; ACIOL anterior chamber intraocular lens; IOL intraocular lens; PCIOL posterior chamber intraocular lens; Phaco/IOL phacoemulsification with intraocular lens placement; PRK photorefractive keratectomy; LASIK laser  assisted in situ keratomileusis; HTN hypertension; DM diabetes mellitus; COPD chronic obstructive pulmonary disease

## 2024-02-07 ENCOUNTER — Encounter (INDEPENDENT_AMBULATORY_CARE_PROVIDER_SITE_OTHER): Payer: Self-pay | Admitting: Ophthalmology

## 2024-02-07 ENCOUNTER — Ambulatory Visit (INDEPENDENT_AMBULATORY_CARE_PROVIDER_SITE_OTHER): Admitting: Ophthalmology

## 2024-02-07 DIAGNOSIS — H25013 Cortical age-related cataract, bilateral: Secondary | ICD-10-CM

## 2024-02-07 DIAGNOSIS — H353221 Exudative age-related macular degeneration, left eye, with active choroidal neovascularization: Secondary | ICD-10-CM | POA: Diagnosis not present

## 2024-02-07 DIAGNOSIS — E119 Type 2 diabetes mellitus without complications: Secondary | ICD-10-CM | POA: Diagnosis not present

## 2024-02-07 DIAGNOSIS — H353213 Exudative age-related macular degeneration, right eye, with inactive scar: Secondary | ICD-10-CM

## 2024-02-07 DIAGNOSIS — Z7984 Long term (current) use of oral hypoglycemic drugs: Secondary | ICD-10-CM

## 2024-02-07 DIAGNOSIS — Z7985 Long-term (current) use of injectable non-insulin antidiabetic drugs: Secondary | ICD-10-CM

## 2024-02-07 DIAGNOSIS — I10 Essential (primary) hypertension: Secondary | ICD-10-CM

## 2024-02-07 DIAGNOSIS — H25813 Combined forms of age-related cataract, bilateral: Secondary | ICD-10-CM

## 2024-02-07 DIAGNOSIS — H35033 Hypertensive retinopathy, bilateral: Secondary | ICD-10-CM

## 2024-02-07 MED ORDER — BEVACIZUMAB CHEMO INJECTION 1.25MG/0.05ML SYRINGE FOR KALEIDOSCOPE
1.2500 mg | INTRAVITREAL | Status: AC | PRN
  Administered 2024-02-07: 1.25 mg via INTRAVITREAL

## 2024-02-17 ENCOUNTER — Ambulatory Visit: Payer: Self-pay | Admitting: Family Medicine

## 2024-02-17 ENCOUNTER — Encounter: Payer: Self-pay | Admitting: Family Medicine

## 2024-02-17 VITALS — BP 136/63 | HR 71 | Resp 16 | Ht 67.0 in | Wt 147.9 lb

## 2024-02-17 DIAGNOSIS — Z7985 Long-term (current) use of injectable non-insulin antidiabetic drugs: Secondary | ICD-10-CM

## 2024-02-17 DIAGNOSIS — I10 Essential (primary) hypertension: Secondary | ICD-10-CM | POA: Diagnosis not present

## 2024-02-17 DIAGNOSIS — E1121 Type 2 diabetes mellitus with diabetic nephropathy: Secondary | ICD-10-CM | POA: Diagnosis not present

## 2024-02-17 DIAGNOSIS — Z7984 Long term (current) use of oral hypoglycemic drugs: Secondary | ICD-10-CM | POA: Diagnosis not present

## 2024-02-17 DIAGNOSIS — N184 Chronic kidney disease, stage 4 (severe): Secondary | ICD-10-CM | POA: Diagnosis not present

## 2024-02-17 DIAGNOSIS — Z1211 Encounter for screening for malignant neoplasm of colon: Secondary | ICD-10-CM | POA: Diagnosis not present

## 2024-02-17 NOTE — Progress Notes (Signed)
 Established patient visit   Patient: Anna Sweeney   DOB: 1951-03-29   73 y.o. Female  MRN: [id number] Visit Date: 02/17/2024  Today's healthcare provider: Nancyann Perry, MD   Chief Complaint  Patient presents with   Medical Management of Chronic Issues    Follow-up DM   Subjective    HPI Discussed the use of AI scribe software for clinical note transcription with the patient, who gave verbal consent to proceed.  History of Present Illness   Anna Sweeney is a 73 year old female with diabetes and hypertension who presents for follow-up of her blood sugar and blood pressure management.  Her blood sugar readings average around 124 mg/dL. She experiences occasional nocturnal hypoglycemia, which wakes her up. She manages these episodes with increased nighttime snacking and uses a sugar pill when necessary.  She is currently taking Trulicity  at a dose of 3.0 mg and glipizide  2.5mg  a day.  Her blood pressure readings are typically around 136/60 mmHg, consistent with her usual readings.  She has recently completed a Cologuard test, which she sent off this morning.     Lab Results  Component Value Date   HGBA1C 6.7 (H) 09/01/2023   HGBA1C 7.2 (A) 06/20/2023   HGBA1C 7.8 (A) 12/15/2022   Wt Readings from Last 3 Encounters:  02/17/24 147 lb 14.4 oz (67.1 kg)  12/23/23 147 lb 9.6 oz (67 kg)  11/01/23 145 lb 1.6 oz (65.8 kg)     Medications: Outpatient Medications Prior to Visit  Medication Sig   amLODipine  (NORVASC ) 5 MG tablet Take 1 tablet (5 mg total) by mouth daily.   aspirin EC 81 MG tablet Take 81 mg by mouth daily.   betamethasone  dipropionate (DIPROLENE ) 0.05 % cream Apply topically 2 (two) times daily as needed.   Cholecalciferol (VITAMIN D -3) 125 MCG (5000 UT) TABS Take 5,000 Units by mouth daily.   Continuous Glucose Sensor (FREESTYLE LIBRE 3 SENSOR) MISC 1 each by Does not apply route every 14 (fourteen) days.   cyclobenzaprine  (FLEXERIL ) 5 MG tablet TAKE 1-2  TABLETS BY MOUTH 3 TIMES DAILY AS NEEDED FOR MUSCLE SPASMS.   Dulaglutide  (TRULICITY ) 3 MG/0.5ML SOAJ Inject 3 mg as directed once a week.   glipiZIDE  (GLUCOTROL  XL) 2.5 MG 24 hr tablet TAKE 1 TABLET BY MOUTH EVERY DAY WITH BREAKFAST   ketoconazole  (NIZORAL ) 2 % cream Apply 1 Application topically 2 (two) times daily. Until clear   lovastatin  (MEVACOR ) 20 MG tablet TAKE 1 TABLET EVERY EVENING   meclizine  (ANTIVERT ) 12.5 MG tablet TAKE 1 TABLET (12.5 MG TOTAL) BY MOUTH 2 (TWO) TIMES DAILY AS NEEDED FOR DIZZINESS.   mupirocin  cream (BACTROBAN ) 2 % Apply 1 application topically 2 (two) times daily.   neomycin -polymyxin-hydrocortisone (CORTISPORIN) OTIC solution Place 3 drops into both ears 2 (two) times daily as needed (itching).   tiZANidine (ZANAFLEX) 2 MG tablet Take 2 mg by mouth every 6 (six) hours as needed.   triamcinolone  cream (KENALOG ) 0.5 % Apply 1 application topically 2 (two) times daily.   verapamil  (CALAN -SR) 240 MG CR tablet TAKE 1 TABLET BY MOUTH AT BEDTIME.   linaclotide  (LINZESS ) 145 MCG CAPS capsule Take 1 capsule (145 mcg total) by mouth daily before breakfast.   TRUE METRIX BLOOD GLUCOSE TEST test strip TEST BLOOD SUGAR EVERY DAY (Patient not taking: Reported on 11/01/2023)   TRUEplus Lancets 33G MISC TEST BLOOD SUGAR EVERY DAY (Patient not taking: Reported on 11/01/2023)   No facility-administered medications prior to visit.  Review of Systems  Constitutional:  Negative for appetite change, chills, fatigue and fever.  Respiratory:  Negative for chest tightness and shortness of breath.   Cardiovascular:  Negative for chest pain and palpitations.  Gastrointestinal:  Negative for abdominal pain, nausea and vomiting.  Neurological:  Negative for dizziness and weakness.       Objective    BP 136/63 (BP Location: Left Arm, Patient Position: Sitting, Cuff Size: Normal)   Pulse 71   Resp 16   Ht 5' 7 (1.702 m)   Wt 147 lb 14.4 oz (67.1 kg)   SpO2 100%   BMI 23.16 kg/m     Physical Exam   General: Appearance:    Well developed, well nourished female in no acute distress  Eyes:    PERRL, conjunctiva/corneas clear, EOM's intact       Lungs:     Clear to auscultation bilaterally, respirations unlabored  Heart:    Normal heart rate. Normal rhythm. No murmurs, rubs, or gallops.    MS:   All extremities are intact.    Neurologic:   Awake, alert, oriented x 3. No apparent focal neurological defect.         Assessment & Plan     1. Diabetes mellitus with nephropathy (HCC) (Primary) Doing well on current medications with occasionally hypoglycemia.  - Hemoglobin A1c  Consider stopping glipizide  if A1c under 6.5%  2. Primary hypertension BP near gaol Continue current medications.    3. Chronic kidney disease, stage 4 (severe) (HCC) Continue regular follow up with Dr. Dennise Nancyann Perry, MD  [facility] [phone] (phone) [phone] (fax)  [facility], [facility]

## 2024-02-18 LAB — HEMOGLOBIN A1C
Est. average glucose Bld gHb Est-mCnc: 140 mg/dL
Hgb A1c MFr Bld: 6.5 % — ABNORMAL HIGH (ref 4.8–5.6)

## 2024-02-19 ENCOUNTER — Ambulatory Visit: Payer: Self-pay | Admitting: Family Medicine

## 2024-02-23 LAB — COLOGUARD: COLOGUARD: NEGATIVE

## 2024-02-24 ENCOUNTER — Ambulatory Visit: Payer: Self-pay | Admitting: Family Medicine

## 2024-02-26 ENCOUNTER — Other Ambulatory Visit: Payer: Self-pay | Admitting: Family Medicine

## 2024-02-26 DIAGNOSIS — I10 Essential (primary) hypertension: Secondary | ICD-10-CM

## 2024-02-29 DIAGNOSIS — Z111 Encounter for screening for respiratory tuberculosis: Secondary | ICD-10-CM | POA: Diagnosis not present

## 2024-02-29 DIAGNOSIS — L400 Psoriasis vulgaris: Secondary | ICD-10-CM | POA: Diagnosis not present

## 2024-02-29 DIAGNOSIS — R531 Weakness: Secondary | ICD-10-CM | POA: Diagnosis not present

## 2024-03-05 NOTE — Progress Notes (Signed)
 [facility] - Clinic Note  03/06/2024   CHIEF COMPLAINT Patient presents for Retina Follow Up  HISTORY OF PRESENT ILLNESS: Hannah Odom is a 73 y.o. female who presents to the clinic today for:  HPI     Retina Follow Up   Patient presents with  Wet AMD.  In left eye.  Severity is moderate.  Duration of 4 weeks.  Since onset it is gradually improving.  I, the attending physician,  performed the HPI with the patient and updated documentation appropriately.        Comments   4 week Retina eval. Patient states vision is doing pretty good.      Last edited by Valdemar Rogue, MD on 03/07/2024 12:42 AM.     Patient states her vision is improving  Referring physician: [name], OD [address] [place],  [place] [address]  HISTORICAL INFORMATION:  Selected notes from the MEDICAL RECORD NUMBER Referred by Dr. Nivia for exu ARMD OS LEE:  Ocular Hx- history of ex ARMD OD s/p IVA OD x17, last in 07.10.12 MICHAELL Pugh [facility]) H/o refractive surgery with Dr. Meridee in early 2000s; h/o of sectoral retinitis pigmentosa OU PMH-   CURRENT MEDICATIONS: Current Outpatient Medications (Ophthalmic Drugs)  Medication Sig   brimonidine (ALPHAGAN P) 0.1 % SOLN Apply to eye.   No current facility-administered medications for this visit. (Ophthalmic Drugs)   Current Outpatient Medications (Other)  Medication Sig   acetaminophen (TYLENOL) 650 MG CR tablet Take by mouth.   ARIPiprazole (ABILIFY) 2 MG tablet Take by mouth.   calcipotriene (DOVONOX) 0.005 % cream Apply topically 2 (two) times daily.   Cholecalciferol 50 MCG (2000 UT) CAPS Take by mouth.   clobetasol cream (TEMOVATE) 0.05 % Apply topically 2 (two) times daily as needed.   DULoxetine (CYMBALTA) 60 MG capsule Take 60 mg by mouth 2 (two) times daily.   ezetimibe (ZETIA) 10 MG tablet Take 10 mg by mouth daily.   furosemide (LASIX) 40 MG tablet Take 40 mg by mouth daily.   hydrOXYzine (ATARAX) 50 MG  tablet Take by mouth.   JARDIANCE 10 MG TABS tablet Take 10 mg by mouth daily.   levothyroxine (SYNTHROID) 25 MCG tablet TK 1 T PO D   linaclotide (LINZESS) 145 MCG CAPS capsule Take by mouth.   lisinopril (ZESTRIL) 20 MG tablet Take by mouth.   mirtazapine (REMERON) 30 MG tablet Take by mouth.   MOUNJARO 2.5 MG/0.5ML Pen SMARTSIG:2.5 Milligram(s) SUB-Q Once a Week   MYRBETRIQ 50 MG TB24 tablet Take 50 mg by mouth daily.   omeprazole (PRILOSEC) 40 MG capsule Take 40 mg by mouth daily.   pravastatin (PRAVACHOL) 40 MG tablet Take 40 mg by mouth at bedtime.   tiZANidine (ZANAFLEX) 4 MG tablet Take by mouth.   valACYclovir (VALTREX) 500 MG tablet Take 500 mg by mouth daily.   No current facility-administered medications for this visit. (Other)   REVIEW OF SYSTEMS: ROS   Positive for: Endocrine, Eyes, Allergic/Imm Last edited by German Olam BRAVO, COT on 03/06/2024  7:49 AM.     ALLERGIES No Known Allergies  PAST MEDICAL HISTORY Past Medical History:  Diagnosis Date   Diabetes mellitus without complication (HCC)    Hypertension    Macular degeneration    Past Surgical History:  Procedure Laterality Date   LASIK     FAMILY HISTORY History reviewed. No pertinent family history. SOCIAL HISTORY Social History   Tobacco Use  Smoking status: Never   Smokeless tobacco: Never  Vaping Use   Vaping status: Never Used  Substance Use Topics   Alcohol use: Never   Drug use: Never       OPHTHALMIC EXAM:  Base Eye Exam     Visual Acuity (Snellen - Linear)       Right Left   Dist [place] 20/CF 20/70 -2   Dist ph [place] 20/NI 20/50 -1         Tonometry (Tonopen, 7:52 AM)       Right Left   Pressure 13 11         Pupils       Dark Light Shape React APD   Right 3 2 Round Sluggish None   Left 3 2 Round Brisk None         Visual Fields       Left Right    Full    Restrictions  Partial inner superior temporal, inferior temporal, superior nasal, inferior nasal  deficiencies         Extraocular Movement       Right Left    Full, Ortho Full, Ortho         Neuro/Psych     Oriented x3: Yes   Mood/Affect: Normal         Dilation     Both eyes: 1.0% Mydriacyl, 2.5% Phenylephrine @ 7:52 AM           Slit Lamp and Fundus Exam     External Exam       Right Left   External Normal Normal         Slit Lamp Exam       Right Left   Lids/Lashes Dermatochalasis - upper lid Dermatochalasis - upper lid   Conjunctiva/Sclera Trace Injection temporal Pinguecula   Cornea 4 cut RK, 2+ Punctate epithelial erosions, arcus, punctate corneal haze Arcus, focal corneal haze/scar superior midzone, tear film debris, 1+ Punctate epithelial erosions   Anterior Chamber Deep and clear Deep and clear   Iris Round and dilated, No NVI Round and poorly dilated, No NVI   Lens 4+ Nuclear sclerosis with brunescence, 3+ Cortical cataract 3-4+ Nuclear sclerosis, 2-3+ Cortical cataract   Anterior Vitreous Posterior vitreous detachment, vitreous condensations Posterior vitreous detachment         Fundus Exam       Right Left   Disc Pink and sharp Pink and sharp, mild PPP   C/D Ratio 0.2 0.2   Macula Flat, Blunted foveal reflex, central disciform scar with surrounding atropy and pigment clumping Blunted foveal reflex, CNV with edema and +heme nasal macula -- slightly improved   Vessels attenuated, mild tortuosity attenuated, Tortuous, focal pigment clumping along superior arcades   Periphery Attached, rare MA, pigmented CR atrophy nasal midzone Attached, No heme, mild reticular degeneration, focal bone spicules nasal quad and RPE atrophy           IMAGING AND PROCEDURES  Imaging and Procedures for 03/06/2024  OCT, Retina - OU - Both Eyes       Right Eye Quality was good. Central Foveal Thickness: 277. Progression has been stable. Findings include no IRF, no SRF, abnormal foveal contour, retinal drusen , outer retinal tubulation, subretinal  hyper-reflective material, pigment epithelial detachment (Central disciform scar, no fluid, no DME).   Left Eye Quality was good. Central Foveal Thickness: 297. Progression has improved. Findings include no IRF, abnormal foveal contour, retinal drusen , subretinal hyper-reflective material, pigment epithelial  detachment, outer retinal atrophy (Mild interval improvement in CNV/PED, SRHM and edema nasal macula, no DME).   Notes *Images captured and stored on drive  Diagnosis / Impression:  Exudative ARMD OU OD: Central disciform scar, no fluid, no DME OS: mild interval improvement in CNV/PED, SRHM, and edema nasal macula, no DME  Clinical management:  See below  Abbreviations: NFP - Normal foveal profile. CME - cystoid macular edema. PED - pigment epithelial detachment. IRF - intraretinal fluid. SRF - subretinal fluid. EZ - ellipsoid zone. ERM - epiretinal membrane. ORA - outer retinal atrophy. ORT - outer retinal tubulation. SRHM - subretinal hyper-reflective material. IRHM - intraretinal hyper-reflective material      Intravitreal Injection, Pharmacologic Agent - OS - Left Eye       Time Out 03/06/2024. 8:35 AM. Confirmed correct patient, procedure, site, and patient consented.   Anesthesia Topical anesthesia was used. Anesthetic medications included Lidocaine 2%, Proparacaine 0.5%.   Procedure Preparation included 5% betadine to ocular surface, eyelid speculum. A supplied (32g) needle was used.   Injection: 1.25 mg Bevacizumab  1.25mg /0.4ml   Route: Intravitreal, Site: Left Eye   NDC: H525437, Lot: [id number], Expiration date: 05/10/2024   Post-op Post injection exam found visual acuity of at least counting fingers. The patient tolerated the procedure well. There were no complications. The patient received written and verbal post procedure care education.           ASSESSMENT/PLAN:   ICD-10-CM   1. Exudative age-related macular degeneration of left eye with active  choroidal neovascularization (HCC)  H35.3221 OCT, Retina - OU - Both Eyes    Intravitreal Injection, Pharmacologic Agent - OS - Left Eye    Bevacizumab  (AVASTIN ) SOLN 1.25 mg    2. Exudative age-related macular degeneration of right eye with inactive scar (HCC)  H35.3213     3. Diabetes mellitus type 2 without retinopathy (HCC)  E11.9     4. Diabetes mellitus treated with oral medication (HCC)  E11.9    Z79.84     5. Diabetes mellitus treated with injections of non-insulin medication (HCC)  E11.9    Z79.85     6. Essential hypertension  I10     7. Hypertensive retinopathy of both eyes  H35.033     8. Combined forms of age-related cataract of both eyes  H25.813       1,2 .Exudative age related macular degeneration, OU  - OD chronic disciform scar / inactive  - OS more recently converted  - previously managed at [facility] by Dr. Cheree  - s/p IVA OS #1 (04.08.25), #2 (05.06.25), #3 (06.03.25), #4 (07.01.25)  - h/o ex ARMD OD - s/p IVA OD x12, last injxn 2012 -- now w/ central disciform scar / atrophy  - OS with recent conversion -- pt reports decreased vision OS x several months  - BCVA OD stable at CF, OS 20/50 from 20/80   - OCT OD: Central disciform scar, no fluid, no DME; OS: mild interval improvement in CNV/PED, SRHM, and edema nasal macula, no DM at 4 wks **discussed decreased efficacy / resistance to Avastin  and potential benefit of switching from Avastin  to Eylea**  - recommend IVA OS #5 today 07.29.25 with follow up in 4 weeks - pt wishes to be treated with IVA OS - RBA of procedure discussed, questions answered - informed consent obtained and signed 04.08.25  - see procedure note - will check Eylea auth for next visit  - f/u in 4 wks --  DFE/OCT, possible injection   3-5. Diabetes mellitus, type 2 without retinopathy  - A1C 7.6 on 06.12.25 - The incidence, risk factors for progression, natural history and treatment options for diabetic retinopathy   were discussed with patient.   - The need for close monitoring of blood glucose, blood pressure, and serum lipids, avoiding cigarette or any type of tobacco, and the need for long term follow up was also discussed with patient. - f/u in 1 year, sooner prn   6,7. Hypertensive retinopathy OU - discussed importance of tight BP control - monitor  8. Mixed Cataract OU - The symptoms of cataract, surgical options, and treatments and risks were discussed with patient. - discussed diagnosis and progression - OD surgery scheduled for next week  Ophthalmic Meds Ordered this visit:  Meds ordered this encounter  Medications   Bevacizumab  (AVASTIN ) SOLN 1.25 mg     Return in about 4 weeks (around 04/03/2024) for f/u exu ARMD OU, DFE, OCT, Possible Injxn.  There are no Patient Instructions on file for this visit.  Explained the diagnoses, plan, and follow up with the patient and they expressed understanding.  Patient expressed understanding of the importance of proper follow up care.   This document serves as a record of services personally performed by Redell JUDITHANN Hans, MD, PhD. It was created on their behalf by Auston Muzzy, COMT. The creation of this record is the provider's dictation and/or activities during the visit.  Electronically signed by: Auston Muzzy, COMT 03/10/24 2:11 AM  This document serves as a record of services personally performed by Redell JUDITHANN Hans, MD, PhD. It was created on their behalf by Alan PARAS. Delores, OA an ophthalmic technician. The creation of this record is the provider's dictation and/or activities during the visit.    Electronically signed by: Alan PARAS. Delores, OA 03/10/24 2:11 AM  Redell JUDITHANN Hans, M.D., Ph.D. Diseases & Surgery of the Retina and Vitreous [facility]  I have reviewed the above documentation for accuracy and completeness, and I agree with the above. Redell JUDITHANN Hans, M.D., Ph.D. 03/10/24 2:15 AM   Abbreviations: M myopia  (nearsighted); A astigmatism; H hyperopia (farsighted); P presbyopia; Mrx spectacle prescription;  CTL contact lenses; OD right eye; OS left eye; OU both eyes  XT exotropia; ET esotropia; PEK punctate epithelial keratitis; PEE punctate epithelial erosions; DES dry eye syndrome; MGD meibomian gland dysfunction; ATs artificial tears; PFAT's preservative free artificial tears; NSC nuclear sclerotic cataract; PSC posterior subcapsular cataract; ERM epi-retinal membrane; PVD posterior vitreous detachment; RD retinal detachment; DM diabetes mellitus; DR diabetic retinopathy; NPDR non-proliferative diabetic retinopathy; PDR proliferative diabetic retinopathy; CSME clinically significant macular edema; DME diabetic macular edema; dbh dot blot hemorrhages; CWS cotton wool spot; POAG primary open angle glaucoma; C/D cup-to-disc ratio; HVF humphrey visual field; GVF goldmann visual field; OCT optical coherence tomography; IOP intraocular pressure; BRVO Branch retinal vein occlusion; CRVO central retinal vein occlusion; CRAO central retinal artery occlusion; BRAO branch retinal artery occlusion; RT retinal tear; SB scleral buckle; PPV pars plana vitrectomy; VH Vitreous hemorrhage; PRP panretinal laser photocoagulation; IVK intravitreal kenalog; VMT vitreomacular traction; MH Macular hole;  NVD neovascularization of the disc; NVE neovascularization elsewhere; AREDS age related eye disease study; ARMD age related macular degeneration; POAG primary open angle glaucoma; EBMD epithelial/anterior basement membrane dystrophy; ACIOL anterior chamber intraocular lens; IOL intraocular lens; PCIOL posterior chamber intraocular lens; Phaco/IOL phacoemulsification with intraocular lens placement; PRK photorefractive keratectomy; LASIK laser assisted in situ keratomileusis; HTN hypertension; DM  diabetes mellitus; COPD chronic obstructive pulmonary disease

## 2024-03-06 ENCOUNTER — Encounter (INDEPENDENT_AMBULATORY_CARE_PROVIDER_SITE_OTHER): Payer: Self-pay | Admitting: Ophthalmology

## 2024-03-06 ENCOUNTER — Ambulatory Visit (INDEPENDENT_AMBULATORY_CARE_PROVIDER_SITE_OTHER): Admitting: Ophthalmology

## 2024-03-06 DIAGNOSIS — I10 Essential (primary) hypertension: Secondary | ICD-10-CM | POA: Diagnosis not present

## 2024-03-06 DIAGNOSIS — E119 Type 2 diabetes mellitus without complications: Secondary | ICD-10-CM

## 2024-03-06 DIAGNOSIS — H353221 Exudative age-related macular degeneration, left eye, with active choroidal neovascularization: Secondary | ICD-10-CM

## 2024-03-06 DIAGNOSIS — H35033 Hypertensive retinopathy, bilateral: Secondary | ICD-10-CM

## 2024-03-06 DIAGNOSIS — H353231 Exudative age-related macular degeneration, bilateral, with active choroidal neovascularization: Secondary | ICD-10-CM

## 2024-03-06 DIAGNOSIS — H353213 Exudative age-related macular degeneration, right eye, with inactive scar: Secondary | ICD-10-CM

## 2024-03-06 DIAGNOSIS — H25813 Combined forms of age-related cataract, bilateral: Secondary | ICD-10-CM

## 2024-03-07 ENCOUNTER — Encounter (INDEPENDENT_AMBULATORY_CARE_PROVIDER_SITE_OTHER): Payer: Self-pay | Admitting: Ophthalmology

## 2024-03-07 MED ORDER — BEVACIZUMAB CHEMO INJECTION 1.25MG/0.05ML SYRINGE FOR KALEIDOSCOPE
1.2500 mg | INTRAVITREAL | Status: AC | PRN
  Administered 2024-03-07: 1.25 mg via INTRAVITREAL

## 2024-04-02 NOTE — Progress Notes (Shared)
 [facility] - Clinic Note  04/03/2024   CHIEF COMPLAINT Patient presents for Retina Follow Up  HISTORY OF PRESENT ILLNESS: Hannah Odom is a 73 y.o. female who presents to the clinic today for:  HPI     Retina Follow Up   Patient presents with  Wet AMD.  In left eye.  This started 4 weeks ago.  Duration of 4 weeks.  Since onset it is stable.  I, the attending physician,  performed the HPI with the patient and updated documentation appropriately.        Comments   4 week retina follow up ARMD and I'VE OS pt is reporting vision has improved sine having cataract surgery her OS was done about 3 weeks ago and she has finished her gtts       Last edited by Valdemar Rogue, MD on 04/03/2024 11:16 AM.    Patient states her vision is improving  Referring physician: [name], OD [address] [place],  [place] [address]  HISTORICAL INFORMATION:  Selected notes from the MEDICAL RECORD NUMBER Referred by Dr. Nivia for exu ARMD OS LEE:  Ocular Hx- history of ex ARMD OD s/p IVA OD x17, last in 07.10.12 MICHAELL Pugh at [facility]) H/o refractive surgery with Dr. Meridee in early 2000s; h/o of sectoral retinitis pigmentosa OU PMH-   CURRENT MEDICATIONS: Current Outpatient Medications (Ophthalmic Drugs)  Medication Sig   brimonidine (ALPHAGAN P) 0.1 % SOLN Apply to eye.   No current facility-administered medications for this visit. (Ophthalmic Drugs)   Current Outpatient Medications (Other)  Medication Sig   acetaminophen (TYLENOL) 650 MG CR tablet Take by mouth.   ARIPiprazole (ABILIFY) 2 MG tablet Take by mouth.   calcipotriene (DOVONOX) 0.005 % cream Apply topically 2 (two) times daily.   Cholecalciferol 50 MCG (2000 UT) CAPS Take by mouth.   clobetasol cream (TEMOVATE) 0.05 % Apply topically 2 (two) times daily as needed.   DULoxetine (CYMBALTA) 60 MG capsule Take 60 mg by mouth 2 (two) times daily.   ezetimibe (ZETIA) 10 MG tablet Take 10 mg by mouth daily.    furosemide (LASIX) 40 MG tablet Take 40 mg by mouth daily.   hydrOXYzine (ATARAX) 50 MG tablet Take by mouth.   JARDIANCE 10 MG TABS tablet Take 10 mg by mouth daily.   levothyroxine (SYNTHROID) 25 MCG tablet TK 1 T PO D   linaclotide (LINZESS) 145 MCG CAPS capsule Take by mouth.   lisinopril (ZESTRIL) 20 MG tablet Take by mouth.   mirtazapine (REMERON) 30 MG tablet Take by mouth.   MOUNJARO 2.5 MG/0.5ML Pen SMARTSIG:2.5 Milligram(s) SUB-Q Once a Week   MYRBETRIQ 50 MG TB24 tablet Take 50 mg by mouth daily.   omeprazole (PRILOSEC) 40 MG capsule Take 40 mg by mouth daily.   pravastatin (PRAVACHOL) 40 MG tablet Take 40 mg by mouth at bedtime.   tiZANidine (ZANAFLEX) 4 MG tablet Take by mouth.   valACYclovir (VALTREX) 500 MG tablet Take 500 mg by mouth daily.   No current facility-administered medications for this visit. (Other)   REVIEW OF SYSTEMS: ROS   Positive for: Endocrine, Eyes, Allergic/Imm Last edited by Resa Delon ORN, COT on 04/03/2024  8:17 AM.      ALLERGIES No Known Allergies  PAST MEDICAL HISTORY Past Medical History:  Diagnosis Date   Diabetes mellitus without complication (HCC)    Hypertension    Macular degeneration    Past Surgical History:  Procedure Laterality Date  LASIK     FAMILY HISTORY History reviewed. No pertinent family history. SOCIAL HISTORY Social History   Tobacco Use   Smoking status: Never   Smokeless tobacco: Never  Vaping Use   Vaping status: Never Used  Substance Use Topics   Alcohol use: Never   Drug use: Never       OPHTHALMIC EXAM:  Base Eye Exam     Visual Acuity (Snellen - Linear)       Right Left   Dist [place] CF at 3' 20/50 -2   Dist ph [place]  20/40         Tonometry (Tonopen, 8:23 AM)       Right Left   Pressure 17 18         Pupils       Pupils Dark Light Shape React APD   Right PERRL 3 2 Round Brisk None   Left PERRL 3 2 Round Brisk None         Visual Fields       Left Right    Full     Restrictions  Partial inner superior temporal, inferior temporal, superior nasal, inferior nasal deficiencies         Extraocular Movement       Right Left    Full, Ortho Full, Ortho         Neuro/Psych     Oriented x3: Yes   Mood/Affect: Normal         Dilation     Both eyes: 2.5% Phenylephrine @ 8:23 AM           Slit Lamp and Fundus Exam     External Exam       Right Left   External Normal Normal         Slit Lamp Exam       Right Left   Lids/Lashes Dermatochalasis - upper lid Dermatochalasis - upper lid   Conjunctiva/Sclera Trace Injection temporal Pinguecula   Cornea 4 cut RK, 2+ inferior Punctate epithelial erosions, arcus, punctate corneal haze @ 130, well healed cataract wound Arcus, focal corneal haze/scar superior midzone, tear film debris, 1+ Punctate epithelial erosions, well healed cataract wound   Anterior Chamber Deep and clear Deep and clear   Iris Round and dilated, No NVI Round and poorly dilated, No NVI   Lens PC IOL in good position PC IOL in good position   Anterior Vitreous Posterior vitreous detachment, vitreous condensations Posterior vitreous detachment         Fundus Exam       Right Left   Disc Pink and sharp, PPA Pink and sharp, mild PPP   C/D Ratio 0.2 0.2   Macula Flat, Blunted foveal reflex, central disciform scar with surrounding atrophy and pigment clumping, no heme Blunted foveal reflex, CNV with edema and +heme nasal macula -- improved, heme resolved   Vessels attenuated, mild tortuosity attenuated, focal pigment clumping along superior arcades, mild tortuosity   Periphery Attached, rare MA, pigmented CR atrophy nasal midzone Attached, No heme, mild reticular degeneration, focal bone spicules nasal quad and RPE atrophy           IMAGING AND PROCEDURES  Imaging and Procedures for 04/03/2024  OCT, Retina - OU - Both Eyes       Right Eye Quality was good. Central Foveal Thickness: 363. Progression has been  stable. Findings include no IRF, no SRF, abnormal foveal contour, retinal drusen , outer retinal tubulation, subretinal hyper-reflective material, pigment epithelial  detachment (Central disciform scar, no fluid, no DME).   Left Eye Quality was good. Central Foveal Thickness: 293. Progression has improved. Findings include no IRF, abnormal foveal contour, retinal drusen , subretinal hyper-reflective material, pigment epithelial detachment, outer retinal atrophy (Interval improvement in CNV/PED, SRHM and edema nasal macula, no DME).   Notes *Images captured and stored on drive  Diagnosis / Impression:  Exudative ARMD OU OD: Central disciform scar, no fluid, no DME OS: Interval improvement in CNV/PED, SRHM, and edema nasal macula, no DME  Clinical management:  See below  Abbreviations: NFP - Normal foveal profile. CME - cystoid macular edema. PED - pigment epithelial detachment. IRF - intraretinal fluid. SRF - subretinal fluid. EZ - ellipsoid zone. ERM - epiretinal membrane. ORA - outer retinal atrophy. ORT - outer retinal tubulation. SRHM - subretinal hyper-reflective material. IRHM - intraretinal hyper-reflective material      Intravitreal Injection, Pharmacologic Agent - OS - Left Eye       Time Out 04/03/2024. 9:08 AM. Confirmed correct patient, procedure, site, and patient consented.   Anesthesia Topical anesthesia was used. Anesthetic medications included Lidocaine 2%, Proparacaine 0.5%.   Procedure Preparation included 5% betadine to ocular surface, eyelid speculum. A (32g) needle was used.   Injection: 1.25 mg Bevacizumab  1.25mg /0.54ml   Route: Intravitreal, Site: Left Eye   NDC: [id number], Lot: [id number], Expiration date: 05/10/2024   Post-op Post injection exam found visual acuity of at least counting fingers. The patient tolerated the procedure well. There were no complications. The patient received written and verbal post procedure care education.            ASSESSMENT/PLAN:   ICD-10-CM   1. Exudative age-related macular degeneration of left eye with active choroidal neovascularization (HCC)  H35.3221 OCT, Retina - OU - Both Eyes    Intravitreal Injection, Pharmacologic Agent - OS - Left Eye    Bevacizumab  (AVASTIN ) SOLN 1.25 mg    2. Exudative age-related macular degeneration of right eye with inactive scar (HCC)  H35.3213     3. Diabetes mellitus type 2 without retinopathy (HCC)  E11.9     4. Diabetes mellitus treated with oral medication (HCC)  E11.9    Z79.84     5. Diabetes mellitus treated with injections of non-insulin medication (HCC)  E11.9    Z79.85     6. Essential hypertension  I10     7. Hypertensive retinopathy of both eyes  H35.033     8. Pseudophakia of both eyes  Z96.1      1,2 .Exudative age related macular degeneration, OU  - OD chronic disciform scar / inactive  - OS more recently converted  - previously managed at [facility] by Dr. Cheree  - s/p IVA OS #1 (04.08.25), #2 (05.06.25), #3 (06.03.25), #4 (07.01.25), #5 (07.29.25)  - h/o ex ARMD OD - s/p IVA OD x12, last injxn 2012 -- now w/ central disciform scar / atrophy  - OS with recent conversion -- pt reports decreased vision OS x several months  - BCVA OD stable at CF, OS 20/50 from 20/80   - OCT OD: Central disciform scar, no fluid, no DME; OS: Interval improvement in CNV/PED, SRHM, and edema nasal macula, no DM at 4 wks  - recommend IVA OS #6 today 08.26.25 with follow up ext to 4-5 weeks - pt wishes to be treated with IVA OS - RBA of procedure discussed, questions answered - informed consent obtained and signed 04.08.25  - see procedure  note - Eylea approved but pt will owe 20% -- pt not opposed to covering cost if needed  - f/u in 4-5 wks -- DFE/OCT, possible injection   3-5. Diabetes mellitus, type 2 without retinopathy  - A1C 7.6 on 06.12.25 - The incidence, risk factors for progression, natural history and treatment options for  diabetic retinopathy  were discussed with patient.   - The need for close monitoring of blood glucose, blood pressure, and serum lipids, avoiding cigarette or any type of tobacco, and the need for long term follow up was also discussed with patient. - f/u in 1 year, sooner prn   6,7. Hypertensive retinopathy OU - discussed importance of tight BP control - monitor  8. Pseudophakia OU  - s/p CE/IOL OU (Dr. Medford Gaudy)  - IOL in good position, doing well  - monitor   Ophthalmic Meds Ordered this visit:  Meds ordered this encounter  Medications   Bevacizumab  (AVASTIN ) SOLN 1.25 mg     Return in about 4 weeks (around 05/01/2024) for 4-5wks exu ARMD OU, DFE, OCT, Possible Injxn.  There are no Patient Instructions on file for this visit.  Explained the diagnoses, plan, and follow up with the patient and they expressed understanding.  Patient expressed understanding of the importance of proper follow up care.   This document serves as a record of services personally performed by Redell JUDITHANN Hans, MD, PhD. It was created on their behalf by Auston Muzzy, COMT. The creation of this record is the provider's dictation and/or activities during the visit.  Electronically signed by: Auston Muzzy, COMT 04/03/24 12:59 PM  This document serves as a record of services personally performed by Redell JUDITHANN Hans, MD, PhD. It was created on their behalf by Almetta Pesa, an ophthalmic technician. The creation of this record is the provider's dictation and/or activities during the visit.    Electronically signed by: Almetta Pesa, OA, 04/03/24  12:59 PM  Redell JUDITHANN Hans, M.D., Ph.D. Diseases & Surgery of the Retina and Vitreous [facility]  I have reviewed the above documentation for accuracy and completeness, and I agree with the above. Redell JUDITHANN Hans, M.D., Ph.D. 04/03/24 12:59 PM   Abbreviations: M myopia (nearsighted); A astigmatism; H hyperopia (farsighted); P  presbyopia; Mrx spectacle prescription;  CTL contact lenses; OD right eye; OS left eye; OU both eyes  XT exotropia; ET esotropia; PEK punctate epithelial keratitis; PEE punctate epithelial erosions; DES dry eye syndrome; MGD meibomian gland dysfunction; ATs artificial tears; PFAT's preservative free artificial tears; NSC nuclear sclerotic cataract; PSC posterior subcapsular cataract; ERM epi-retinal membrane; PVD posterior vitreous detachment; RD retinal detachment; DM diabetes mellitus; DR diabetic retinopathy; NPDR non-proliferative diabetic retinopathy; PDR proliferative diabetic retinopathy; CSME clinically significant macular edema; DME diabetic macular edema; dbh dot blot hemorrhages; CWS cotton wool spot; POAG primary open angle glaucoma; C/D cup-to-disc ratio; HVF humphrey visual field; GVF goldmann visual field; OCT optical coherence tomography; IOP intraocular pressure; BRVO Branch retinal vein occlusion; CRVO central retinal vein occlusion; CRAO central retinal artery occlusion; BRAO branch retinal artery occlusion; RT retinal tear; SB scleral buckle; PPV pars plana vitrectomy; VH Vitreous hemorrhage; PRP panretinal laser photocoagulation; IVK intravitreal kenalog; VMT vitreomacular traction; MH Macular hole;  NVD neovascularization of the disc; NVE neovascularization elsewhere; AREDS age related eye disease study; ARMD age related macular degeneration; POAG primary open angle glaucoma; EBMD epithelial/anterior basement membrane dystrophy; ACIOL anterior chamber intraocular lens; IOL intraocular lens; PCIOL posterior chamber intraocular lens; Phaco/IOL phacoemulsification with  intraocular lens placement; PRK photorefractive keratectomy; LASIK laser assisted in situ keratomileusis; HTN hypertension; DM diabetes mellitus; COPD chronic obstructive pulmonary disease

## 2024-04-03 ENCOUNTER — Ambulatory Visit (INDEPENDENT_AMBULATORY_CARE_PROVIDER_SITE_OTHER): Admitting: Ophthalmology

## 2024-04-03 ENCOUNTER — Encounter (INDEPENDENT_AMBULATORY_CARE_PROVIDER_SITE_OTHER): Payer: Self-pay | Admitting: Ophthalmology

## 2024-04-03 DIAGNOSIS — H35033 Hypertensive retinopathy, bilateral: Secondary | ICD-10-CM

## 2024-04-03 DIAGNOSIS — Z7985 Long-term (current) use of injectable non-insulin antidiabetic drugs: Secondary | ICD-10-CM

## 2024-04-03 DIAGNOSIS — E119 Type 2 diabetes mellitus without complications: Secondary | ICD-10-CM | POA: Diagnosis not present

## 2024-04-03 DIAGNOSIS — H353213 Exudative age-related macular degeneration, right eye, with inactive scar: Secondary | ICD-10-CM | POA: Diagnosis not present

## 2024-04-03 DIAGNOSIS — I10 Essential (primary) hypertension: Secondary | ICD-10-CM | POA: Diagnosis not present

## 2024-04-03 DIAGNOSIS — H25813 Combined forms of age-related cataract, bilateral: Secondary | ICD-10-CM

## 2024-04-03 DIAGNOSIS — Z7984 Long term (current) use of oral hypoglycemic drugs: Secondary | ICD-10-CM

## 2024-04-03 DIAGNOSIS — Z961 Presence of intraocular lens: Secondary | ICD-10-CM

## 2024-04-03 DIAGNOSIS — H353221 Exudative age-related macular degeneration, left eye, with active choroidal neovascularization: Secondary | ICD-10-CM | POA: Diagnosis not present

## 2024-04-03 MED ORDER — BEVACIZUMAB CHEMO INJECTION 1.25MG/0.05ML SYRINGE FOR KALEIDOSCOPE
1.2500 mg | INTRAVITREAL | Status: AC | PRN
  Administered 2024-04-03: 1.25 mg via INTRAVITREAL

## 2024-04-17 ENCOUNTER — Encounter: Payer: Self-pay | Admitting: Urology

## 2024-04-20 ENCOUNTER — Encounter

## 2024-04-25 ENCOUNTER — Other Ambulatory Visit

## 2024-04-25 NOTE — Progress Notes (Signed)
 [facility] - Clinic Note  05/08/2024   CHIEF COMPLAINT Patient presents for Retina Follow Up  HISTORY OF PRESENT ILLNESS: Hannah Odom is a 73 y.o. female who presents to the clinic today for:  HPI     Retina Follow Up   Patient presents with  Wet AMD.  In both eyes.  This started 4 weeks ago.  Duration of 4 weeks.  Since onset it is stable.  I, the attending physician,  performed the HPI with the patient and updated documentation appropriately.        Comments   4 week retina follow up AMD OU and IVA OU pt is reporting vision seems little worse bu end of the day she has floaters denies any flashes       Last edited by Valdemar Rogue, MD on 05/08/2024 10:02 AM.     Patient states in the mornings she sees well but as the day goes on her eyes get tired. Uses Refresh gtts.   Referring physician: [name], OD [address] [place],  [place] [address]  HISTORICAL INFORMATION:  Selected notes from the MEDICAL RECORD NUMBER Referred by Dr. Nivia for exu ARMD OS LEE:  Ocular Hx- history of ex ARMD OD s/p IVA OD x17, last in 07.10.12 MICHAELL Pugh at [facility]) H/o refractive surgery with Dr. Meridee in early 2000s; h/o of sectoral retinitis pigmentosa OU PMH-   CURRENT MEDICATIONS: Current Outpatient Medications (Ophthalmic Drugs)  Medication Sig   brimonidine (ALPHAGAN P) 0.1 % SOLN Apply to eye.   No current facility-administered medications for this visit. (Ophthalmic Drugs)   Current Outpatient Medications (Other)  Medication Sig   acetaminophen (TYLENOL) 650 MG CR tablet Take by mouth.   ARIPiprazole (ABILIFY) 2 MG tablet Take by mouth.   calcipotriene (DOVONOX) 0.005 % cream Apply topically 2 (two) times daily.   Cholecalciferol 50 MCG (2000 UT) CAPS Take by mouth.   clobetasol cream (TEMOVATE) 0.05 % Apply topically 2 (two) times daily as needed.   DULoxetine (CYMBALTA) 60 MG capsule Take 60 mg by mouth 2 (two) times daily.   ezetimibe (ZETIA)  10 MG tablet Take 10 mg by mouth daily.   furosemide (LASIX) 40 MG tablet Take 40 mg by mouth daily.   hydrOXYzine (ATARAX) 50 MG tablet Take by mouth.   JARDIANCE 10 MG TABS tablet Take 10 mg by mouth daily.   levothyroxine (SYNTHROID) 25 MCG tablet TK 1 T PO D   linaclotide (LINZESS) 145 MCG CAPS capsule Take by mouth.   lisinopril (ZESTRIL) 20 MG tablet Take by mouth.   mirtazapine (REMERON) 30 MG tablet Take by mouth.   MOUNJARO 2.5 MG/0.5ML Pen SMARTSIG:2.5 Milligram(s) SUB-Q Once a Week   MYRBETRIQ 50 MG TB24 tablet Take 50 mg by mouth daily.   omeprazole (PRILOSEC) 40 MG capsule Take 40 mg by mouth daily.   pravastatin (PRAVACHOL) 40 MG tablet Take 40 mg by mouth at bedtime.   tiZANidine (ZANAFLEX) 4 MG tablet Take by mouth.   valACYclovir (VALTREX) 500 MG tablet Take 500 mg by mouth daily.   No current facility-administered medications for this visit. (Other)   REVIEW OF SYSTEMS: ROS   Positive for: Endocrine, Eyes, Allergic/Imm Last edited by Resa Delon ORN, COT on 05/08/2024  7:52 AM.       ALLERGIES No Known Allergies  PAST MEDICAL HISTORY Past Medical History:  Diagnosis Date   Diabetes mellitus without complication (HCC)    Hypertension  Macular degeneration    Past Surgical History:  Procedure Laterality Date   LASIK     FAMILY HISTORY History reviewed. No pertinent family history. SOCIAL HISTORY Social History   Tobacco Use   Smoking status: Never   Smokeless tobacco: Never  Vaping Use   Vaping status: Never Used  Substance Use Topics   Alcohol use: Never   Drug use: Never       OPHTHALMIC EXAM:  Base Eye Exam     Visual Acuity (Snellen - Linear)       Right Left   Dist [place] CF at 3' 20/40 -2   Dist ph [place] NI NI         Tonometry (Tonopen, 7:56 AM)       Right Left   Pressure 13 15         Pupils       Pupils Dark Light Shape React APD   Right PERRL 3 2 Round Brisk None   Left PERRL 3 2 Round Brisk None          Visual Fields       Left Right    Full    Restrictions  Partial inner superior temporal, inferior temporal, superior nasal, inferior nasal deficiencies         Extraocular Movement       Right Left    Full, Ortho Full, Ortho         Neuro/Psych     Oriented x3: Yes   Mood/Affect: Normal         Dilation     Both eyes: 2.5% Phenylephrine @ 7:56 AM           Slit Lamp and Fundus Exam     External Exam       Right Left   External Normal Normal         Slit Lamp Exam       Right Left   Lids/Lashes Dermatochalasis - upper lid Dermatochalasis - upper lid   Conjunctiva/Sclera Trace Injection temporal Pinguecula   Cornea 4 cut RK, 2+ inferior Punctate epithelial erosions, arcus, punctate corneal haze @ 130, well healed cataract wound Arcus, focal corneal haze/scar superior midzone, tear film debris, 1+ Punctate epithelial erosions, well healed cataract wound   Anterior Chamber Deep and clear Deep and clear   Iris Round and dilated, No NVI Round and poorly dilated, No NVI   Lens PC IOL in good position PC IOL in good position   Anterior Vitreous Posterior vitreous detachment, vitreous condensations Posterior vitreous detachment         Fundus Exam       Right Left   Disc Pink and sharp, PPA Pink and sharp, mild PPP   C/D Ratio 0.2 0.2   Macula Flat, Blunted foveal reflex, central disciform scar with surrounding atrophy and pigment clumping, no heme Blunted foveal reflex, CNV with edema and +heme nasal macula -- improved, heme stably resolved   Vessels attenuated, mild tortuosity attenuated, focal pigment clumping along superior arcades, mild tortuosity   Periphery Attached, rare MA, pigmented CR atrophy nasal midzone Attached, No heme, mild reticular degeneration, focal bone spicules nasal quad and RPE atrophy           IMAGING AND PROCEDURES  Imaging and Procedures for 05/08/2024  OCT, Retina - OU - Both Eyes       Right Eye Quality was good.  Central Foveal Thickness: 271. Progression has been stable. Findings include no IRF, no  SRF, abnormal foveal contour, retinal drusen , outer retinal tubulation, subretinal hyper-reflective material, pigment epithelial detachment (Central disciform scar, no fluid, no DME).   Left Eye Quality was good. Central Foveal Thickness: 288. Progression has improved. Findings include no IRF, abnormal foveal contour, retinal drusen , subretinal hyper-reflective material, pigment epithelial detachment, outer retinal atrophy (Interval improvement in CNV/PED, SRHM and edema nasal macula, no DME).   Notes *Images captured and stored on drive  Diagnosis / Impression:  Exudative ARMD OU OD: Central disciform scar, no fluid, no DME OS: Interval improvement in CNV/PED, SRHM, and edema nasal macula, no DME  Clinical management:  See below  Abbreviations: NFP - Normal foveal profile. CME - cystoid macular edema. PED - pigment epithelial detachment. IRF - intraretinal fluid. SRF - subretinal fluid. EZ - ellipsoid zone. ERM - epiretinal membrane. ORA - outer retinal atrophy. ORT - outer retinal tubulation. SRHM - subretinal hyper-reflective material. IRHM - intraretinal hyper-reflective material      Intravitreal Injection, Pharmacologic Agent - OS - Left Eye       Time Out 05/08/2024. 7:57 AM. Confirmed correct patient, procedure, site, and patient consented.   Anesthesia Topical anesthesia was used. Anesthetic medications included Lidocaine 2%, Proparacaine 0.5%.   Procedure Preparation included 5% betadine to ocular surface, eyelid speculum. A (32g) needle was used.   Injection: 1.25 mg Bevacizumab  1.25mg /0.78ml   Route: Intravitreal, Site: Left Eye   NDC: [id number], Lot: [id number], Expiration date: 07/05/2024   Post-op Post injection exam found visual acuity of at least counting fingers. The patient tolerated the procedure well. There were no complications. The patient received written and verbal  post procedure care education.            ASSESSMENT/PLAN:   ICD-10-CM   1. Exudative age-related macular degeneration of left eye with active choroidal neovascularization (HCC)  H35.3221 OCT, Retina - OU - Both Eyes    Intravitreal Injection, Pharmacologic Agent - OS - Left Eye    Bevacizumab  (AVASTIN ) SOLN 1.25 mg    2. Exudative age-related macular degeneration of right eye with inactive scar (HCC)  H35.3213     3. Diabetes mellitus type 2 without retinopathy (HCC)  E11.9     4. Diabetes mellitus treated with oral medication (HCC)  E11.9    Z79.84     5. Diabetes mellitus treated with injections of non-insulin medication (HCC)  E11.9    Z79.85     6. Essential hypertension  I10     7. Hypertensive retinopathy of both eyes  H35.033     8. Pseudophakia of both eyes  Z96.1       1,2 .Exudative age related macular degeneration, OU  - OD chronic disciform scar / inactive  - OS more recently converted  - previously managed at [facility] by Dr. Cheree  - s/p IVA OS #1 (04.08.25), #2 (05.06.25), #3 (06.03.25), #4 (07.01.25), #5 (07.29.25), #6 (08.26.25)  - h/o ex ARMD OD - s/p IVA OD x12, last injxn 2012 -- now w/ central disciform scar / atrophy  - OS with recent conversion -- pt reports decreased vision OS x several months  - BCVA OD stable at CF, OS 20/40 stable   - OCT OD: Central disciform scar, no fluid, no DME; OS: Interval improvement in CNV/PED, SRHM, and edema nasal macula, no DM at 5 wks  - recommend IVA OS #7 today 09.30.25 with follow up ext to 5-6 weeks - pt wishes to be treated with IVA OS -  RBA of procedure discussed, questions answered - informed consent obtained and signed 04.08.25  - see procedure note - Eylea approved but pt will owe 20% -- pt not opposed to covering cost if needed  - f/u in 5-6 wks -- DFE/OCT, possible injection   3-5. Diabetes mellitus, type 2 without retinopathy  - A1C 7.6 on 06.12.25 - The incidence, risk  factors for progression, natural history and treatment options for diabetic retinopathy  were discussed with patient.   - The need for close monitoring of blood glucose, blood pressure, and serum lipids, avoiding cigarette or any type of tobacco, and the need for long term follow up was also discussed with patient. - f/u in 1 year, sooner prn   6,7. Hypertensive retinopathy OU - discussed importance of tight BP control - monitor  8. Pseudophakia OU  - s/p CE/IOL OU (Dr. Medford Gaudy)  - IOL in good position, doing well  - monitor   Ophthalmic Meds Ordered this visit:  Meds ordered this encounter  Medications   Bevacizumab  (AVASTIN ) SOLN 1.25 mg     Return for 5-6wks exu ARMD OU, DFE, OCT, Possible Injxn.  There are no Patient Instructions on file for this visit.  Explained the diagnoses, plan, and follow up with the patient and they expressed understanding.  Patient expressed understanding of the importance of proper follow up care.   This document serves as a record of services personally performed by Redell JUDITHANN Hans, MD, PhD. It was created on their behalf by Wanda GEANNIE Keens, COT an ophthalmic technician. The creation of this record is the provider's dictation and/or activities during the visit.    Electronically signed by:  Wanda GEANNIE Keens, COT  05/08/24 10:03 AM  This document serves as a record of services personally performed by Redell JUDITHANN Hans, MD, PhD. It was created on their behalf by Almetta Pesa, an ophthalmic technician. The creation of this record is the provider's dictation and/or activities during the visit.    Electronically signed by: Almetta Pesa, OA, 05/08/24  10:03 AM   Redell JUDITHANN Hans, M.D., Ph.D. Diseases & Surgery of the Retina and Vitreous [facility]  I have reviewed the above documentation for accuracy and completeness, and I agree with the above. Redell JUDITHANN Hans, M.D., Ph.D. 05/08/24 10:03 AM     Abbreviations: M myopia (nearsighted); A astigmatism; H hyperopia (farsighted); P presbyopia; Mrx spectacle prescription;  CTL contact lenses; OD right eye; OS left eye; OU both eyes  XT exotropia; ET esotropia; PEK punctate epithelial keratitis; PEE punctate epithelial erosions; DES dry eye syndrome; MGD meibomian gland dysfunction; ATs artificial tears; PFAT's preservative free artificial tears; NSC nuclear sclerotic cataract; PSC posterior subcapsular cataract; ERM epi-retinal membrane; PVD posterior vitreous detachment; RD retinal detachment; DM diabetes mellitus; DR diabetic retinopathy; NPDR non-proliferative diabetic retinopathy; PDR proliferative diabetic retinopathy; CSME clinically significant macular edema; DME diabetic macular edema; dbh dot blot hemorrhages; CWS cotton wool spot; POAG primary open angle glaucoma; C/D cup-to-disc ratio; HVF humphrey visual field; GVF goldmann visual field; OCT optical coherence tomography; IOP intraocular pressure; BRVO Branch retinal vein occlusion; CRVO central retinal vein occlusion; CRAO central retinal artery occlusion; BRAO branch retinal artery occlusion; RT retinal tear; SB scleral buckle; PPV pars plana vitrectomy; VH Vitreous hemorrhage; PRP panretinal laser photocoagulation; IVK intravitreal kenalog; VMT vitreomacular traction; MH Macular hole;  NVD neovascularization of the disc; NVE neovascularization elsewhere; AREDS age related eye disease study; ARMD age related macular degeneration; POAG primary open angle  glaucoma; EBMD epithelial/anterior basement membrane dystrophy; ACIOL anterior chamber intraocular lens; IOL intraocular lens; PCIOL posterior chamber intraocular lens; Phaco/IOL phacoemulsification with intraocular lens placement; PRK photorefractive keratectomy; LASIK laser assisted in situ keratomileusis; HTN hypertension; DM diabetes mellitus; COPD chronic obstructive pulmonary disease

## 2024-05-03 DIAGNOSIS — E785 Hyperlipidemia, unspecified: Secondary | ICD-10-CM | POA: Diagnosis not present

## 2024-05-03 DIAGNOSIS — I129 Hypertensive chronic kidney disease with stage 1 through stage 4 chronic kidney disease, or unspecified chronic kidney disease: Secondary | ICD-10-CM | POA: Diagnosis not present

## 2024-05-03 DIAGNOSIS — E1169 Type 2 diabetes mellitus with other specified complication: Secondary | ICD-10-CM | POA: Diagnosis not present

## 2024-05-03 DIAGNOSIS — R269 Unspecified abnormalities of gait and mobility: Secondary | ICD-10-CM | POA: Diagnosis not present

## 2024-05-03 DIAGNOSIS — N1831 Chronic kidney disease, stage 3a: Secondary | ICD-10-CM | POA: Diagnosis not present

## 2024-05-03 DIAGNOSIS — Z008 Encounter for other general examination: Secondary | ICD-10-CM | POA: Diagnosis not present

## 2024-05-08 ENCOUNTER — Ambulatory Visit (INDEPENDENT_AMBULATORY_CARE_PROVIDER_SITE_OTHER): Admitting: Ophthalmology

## 2024-05-08 ENCOUNTER — Encounter (INDEPENDENT_AMBULATORY_CARE_PROVIDER_SITE_OTHER): Payer: Self-pay | Admitting: Ophthalmology

## 2024-05-08 DIAGNOSIS — H353213 Exudative age-related macular degeneration, right eye, with inactive scar: Secondary | ICD-10-CM

## 2024-05-08 DIAGNOSIS — Z961 Presence of intraocular lens: Secondary | ICD-10-CM

## 2024-05-08 DIAGNOSIS — I10 Essential (primary) hypertension: Secondary | ICD-10-CM

## 2024-05-08 DIAGNOSIS — H353221 Exudative age-related macular degeneration, left eye, with active choroidal neovascularization: Secondary | ICD-10-CM | POA: Diagnosis not present

## 2024-05-08 DIAGNOSIS — Z7984 Long term (current) use of oral hypoglycemic drugs: Secondary | ICD-10-CM

## 2024-05-08 DIAGNOSIS — Z7985 Long-term (current) use of injectable non-insulin antidiabetic drugs: Secondary | ICD-10-CM

## 2024-05-08 DIAGNOSIS — H35033 Hypertensive retinopathy, bilateral: Secondary | ICD-10-CM

## 2024-05-08 DIAGNOSIS — E119 Type 2 diabetes mellitus without complications: Secondary | ICD-10-CM

## 2024-05-08 MED ORDER — BEVACIZUMAB CHEMO INJECTION 1.25MG/0.05ML SYRINGE FOR KALEIDOSCOPE
1.2500 mg | INTRAVITREAL | Status: AC | PRN
  Administered 2024-05-08: 1.25 mg via INTRAVITREAL

## 2024-05-09 ENCOUNTER — Inpatient Hospital Stay
Admission: RE | Admit: 2024-05-09 | Discharge: 2024-05-09 | Disposition: A | Source: Ambulatory Visit | Attending: Urology | Admitting: Urology

## 2024-05-09 DIAGNOSIS — N281 Cyst of kidney, acquired: Secondary | ICD-10-CM

## 2024-05-09 MED ORDER — GADOPICLENOL 0.5 MMOL/ML IV SOLN
7.5000 mL | Freq: Once | INTRAVENOUS | Status: AC | PRN
Start: 2024-05-09 — End: 2024-05-09
  Administered 2024-05-09: 7.5 mL via INTRAVENOUS

## 2024-05-10 ENCOUNTER — Ambulatory Visit: Payer: Self-pay | Admitting: Urology

## 2024-05-21 ENCOUNTER — Telehealth: Payer: Self-pay

## 2024-05-21 NOTE — Telephone Encounter (Signed)
 Filled and mailed pt portion, faxed provider portion. On lilly cares trulicity 

## 2024-05-22 NOTE — Telephone Encounter (Signed)
 Received provider portion back from provider office today

## 2024-05-23 ENCOUNTER — Inpatient Hospital Stay: Admit: 2024-05-23 | Payer: MEDICARE | Primary: Medical

## 2024-05-23 ENCOUNTER — Encounter: Payer: Self-pay | Admitting: Family Medicine

## 2024-05-23 ENCOUNTER — Ambulatory Visit (INDEPENDENT_AMBULATORY_CARE_PROVIDER_SITE_OTHER): Admitting: Family Medicine

## 2024-05-23 VITALS — BP 138/60 | HR 59 | Ht 67.0 in | Wt 148.2 lb

## 2024-05-23 DIAGNOSIS — E7800 Pure hypercholesterolemia, unspecified: Secondary | ICD-10-CM | POA: Diagnosis not present

## 2024-05-23 DIAGNOSIS — N184 Chronic kidney disease, stage 4 (severe): Secondary | ICD-10-CM | POA: Diagnosis not present

## 2024-05-23 DIAGNOSIS — Z23 Encounter for immunization: Secondary | ICD-10-CM

## 2024-05-23 DIAGNOSIS — E559 Vitamin D deficiency, unspecified: Secondary | ICD-10-CM

## 2024-05-23 DIAGNOSIS — E1121 Type 2 diabetes mellitus with diabetic nephropathy: Secondary | ICD-10-CM | POA: Diagnosis not present

## 2024-05-23 DIAGNOSIS — I10 Essential (primary) hypertension: Secondary | ICD-10-CM | POA: Diagnosis not present

## 2024-05-23 DIAGNOSIS — Z7985 Long-term (current) use of injectable non-insulin antidiabetic drugs: Secondary | ICD-10-CM

## 2024-05-23 DIAGNOSIS — Z1231 Encounter for screening mammogram for malignant neoplasm of breast: Principal | ICD-10-CM

## 2024-05-23 MED ORDER — FREESTYLE LIBRE 3 SENSOR MISC: 1.0000 | 3 refills | Status: DC

## 2024-05-23 NOTE — Patient Instructions (Signed)
 Please review the attached list of medications and notify my office if there are any errors.   Try Cetaphil moisturizing lotion to your dry ears every day

## 2024-05-23 NOTE — Progress Notes (Signed)
 Established patient visit   Patient: Anna Sweeney   DOB: Oct 04, 1950   73 y.o. Female  MRN: [id number] Visit Date: 05/23/2024  Today's healthcare provider: Nancyann Perry, MD   Chief Complaint  Patient presents with   Medical Management of Chronic Issues    T2DM, CKD   Subjective    Discussed the use of AI scribe software for clinical note transcription with the patient, who gave verbal consent to proceed.  History of Present Illness   Anna Sweeney is a 73 year old female with type 2 diabetes who presents for follow-up.  She was last seen in July when her A1c was 6.5. At that time, she experienced occasional hypoglycemia, leading to the discontinuation of glipizide . She continues to take Trulicity  3 mg once a week and has had no further episodes of hypoglycemia. Her blood sugar levels have been stable, with her monitor indicating a consistent daily average.  She has hyperlipidemia and is on lovastatin  20 mg daily.  She has stage 4 chronic kidney disease and is followed by nephrology, with an upcoming appointment on October 20th.  She mentions experiencing dry skin around her ears, which she describes as 'driving her nuts.' She recalls a previous similar issue behind her ear that was treated with a small tube of cream, likely a steroid cream.  She needs a new prescription for her Freestyle Libre sensors, as two of the initial six sensors failed. She prefers to pick them up at [facility] rather than using mail order.  No chest pain, heart flutters, or shortness of breath.     Lab Results  Component Value Date   HGBA1C 6.5 (H) 02/17/2024   HGBA1C 6.7 (H) 09/01/2023   HGBA1C 7.2 (A) 06/20/2023   Lab Results  Component Value Date   NA 138 01/12/2024   K 4.1 01/12/2024   CREATININE 2.2 (A) 01/12/2024   EGFR 23 01/12/2024   GLUCOSE 135 (H) 12/20/2022   Lab Results  Component Value Date   VD25OH 72.8 12/20/2022   Lab Results  Component Value Date   CHOL 165  12/20/2022   HDL 42 12/20/2022   LDLCALC 82 12/20/2022   TRIG 245 (H) 12/20/2022   CHOLHDL 3.9 12/20/2022     Medications: Outpatient Medications Prior to Visit  Medication Sig   amLODipine  (NORVASC ) 5 MG tablet TAKE 1 TABLET DAILY   aspirin EC 81 MG tablet Take 81 mg by mouth daily.   betamethasone  dipropionate (DIPROLENE ) 0.05 % cream Apply topically 2 (two) times daily as needed.   Cholecalciferol (VITAMIN D -3) 125 MCG (5000 UT) TABS Take 5,000 Units by mouth daily.   cyclobenzaprine  (FLEXERIL ) 5 MG tablet TAKE 1-2 TABLETS BY MOUTH 3 TIMES DAILY AS NEEDED FOR MUSCLE SPASMS.   Dulaglutide  (TRULICITY ) 3 MG/0.5ML SOAJ Inject 3 mg as directed once a week.   ketoconazole  (NIZORAL ) 2 % cream Apply 1 Application topically 2 (two) times daily. Until clear   linaclotide  (LINZESS ) 145 MCG CAPS capsule Take 1 capsule (145 mcg total) by mouth daily before breakfast.   lovastatin  (MEVACOR ) 20 MG tablet TAKE 1 TABLET EVERY EVENING   meclizine  (ANTIVERT ) 12.5 MG tablet TAKE 1 TABLET (12.5 MG TOTAL) BY MOUTH 2 (TWO) TIMES DAILY AS NEEDED FOR DIZZINESS.   mupirocin  cream (BACTROBAN ) 2 % Apply 1 application topically 2 (two) times daily.   neomycin -polymyxin-hydrocortisone (CORTISPORIN) OTIC solution Place 3 drops into both ears 2 (two) times daily as needed (itching).   tiZANidine (ZANAFLEX) 2 MG tablet  Take 2 mg by mouth every 6 (six) hours as needed.   triamcinolone  cream (KENALOG ) 0.5 % Apply 1 application topically 2 (two) times daily.   verapamil  (CALAN -SR) 240 MG CR tablet TAKE 1 TABLET BY MOUTH AT BEDTIME.   Continuous Glucose Sensor (FREESTYLE LIBRE 3 SENSOR) MISC 1 each by Does not apply route every 14 (fourteen) days.   glipiZIDE  (GLUCOTROL  XL) 2.5 MG 24 hr tablet TAKE 1 TABLET BY MOUTH EVERY DAY WITH BREAKFAST   TRUE METRIX BLOOD GLUCOSE TEST test strip TEST BLOOD SUGAR EVERY DAY (Patient not taking: Reported on 11/01/2023)   TRUEplus Lancets 33G MISC TEST BLOOD SUGAR EVERY DAY (Patient not  taking: Reported on 11/01/2023)   No facility-administered medications prior to visit.   Review of Systems  Constitutional:  Negative for appetite change, chills, fatigue and fever.  Respiratory:  Negative for chest tightness and shortness of breath.   Cardiovascular:  Negative for chest pain and palpitations.  Gastrointestinal:  Negative for abdominal pain, nausea and vomiting.  Neurological:  Negative for dizziness and weakness.       Objective    BP 138/60 (BP Location: Left Arm, Patient Position: Sitting, Cuff Size: Normal)   Pulse (!) 59   Ht 5' 7 (1.702 m)   Wt 148 lb 3.2 oz (67.2 kg)   SpO2 99%   BMI 23.21 kg/m   Physical Exam   General: Appearance:    Well developed, well nourished female in no acute distress  Eyes:    PERRL, conjunctiva/corneas clear, EOM's intact       Lungs:     Clear to auscultation bilaterally, respirations unlabored  Heart:    Bradycardic. Normal rhythm. No murmurs, rubs, or gallops.    MS:   All extremities are intact.    Neurologic:   Awake, alert, oriented x 3. No apparent focal neurological defect.         Assessment & Plan        Type 2 diabetes mellitus Type 2 diabetes mellitus managed with Trulicity  3 mg weekly. Glipizide  was discontinued due to hypoglycemia. No further hypoglycemic episodes reported. Blood glucose levels are well-controlled. - Continue Trulicity  3 mg once weekly. - Prescribe additional Freestyle Libre sensors for glucose monitoring from [facility]. - Consider increasing Trulicity  dosage if blood glucose levels increase.  Chronic kidney disease, stage 4 Stage 4 chronic kidney disease under nephrology care with an upcoming appointment on October 20th. - Order blood work including kidney function tests, parathyroid  hormone, and vitamin D  levels. - Send results to nephrologist.  Hyperlipidemia Hyperlipidemia managed with lovastatin  20 mg daily. - Order lipid panel.  Dry skin of external ear Dry skin around the  external ear, not involving the ear canal. Discussed use of moisturizing creams and cautioned against frequent use of steroid creams due to risk of skin ulceration. - Recommend Cetaphil moisturizing cream for external ear dryness. - Consider prescription steroid cream for short-term use if needed.  General Health Maintenance Flu vaccination due. - Administer flu shot.    Hypertension - Well controlled.  Continue current medications.    Vitamin D  deficiency  - Taking vitamin D  supplements consistently.       Nancyann Perry, MD  [facility] [phone] (phone) [phone] (fax)  [facility]

## 2024-05-25 LAB — COMPREHENSIVE METABOLIC PANEL WITH GFR
ALT: 15 IU/L (ref 0–32)
AST: 25 IU/L (ref 0–40)
Albumin: 4.6 g/dL (ref 3.8–4.8)
Alkaline Phosphatase: 95 IU/L (ref 49–135)
BUN/Creatinine Ratio: 10 — ABNORMAL LOW (ref 12–28)
BUN: 22 mg/dL (ref 8–27)
Bilirubin Total: 0.3 mg/dL (ref 0.0–1.2)
CO2: 21 mmol/L (ref 20–29)
Calcium: 9.8 mg/dL (ref 8.7–10.3)
Chloride: 100 mmol/L (ref 96–106)
Creatinine, Ser: 2.28 mg/dL — ABNORMAL HIGH (ref 0.57–1.00)
Globulin, Total: 2.7 g/dL (ref 1.5–4.5)
Glucose: 107 mg/dL — ABNORMAL HIGH (ref 70–99)
Potassium: 4.1 mmol/L (ref 3.5–5.2)
Sodium: 139 mmol/L (ref 134–144)
Total Protein: 7.3 g/dL (ref 6.0–8.5)
eGFR: 22 mL/min/1.73 — ABNORMAL LOW (ref >59)

## 2024-05-25 LAB — CBC
Hematocrit: 42.8 % (ref 34.0–46.6)
Hemoglobin: 13.8 g/dL (ref 11.1–15.9)
MCH: 28 pg (ref 26.6–33.0)
MCHC: 32.2 g/dL (ref 31.5–35.7)
MCV: 87 fL (ref 79–97)
Platelets: 238 x10E3/uL (ref 150–450)
RBC: 4.92 x10E6/uL (ref 3.77–5.28)
RDW: 14.3 % (ref 11.7–15.4)
WBC: 6.8 x10E3/uL (ref 3.4–10.8)

## 2024-05-25 LAB — LIPID PANEL
Chol/HDL Ratio: 3.6 ratio (ref 0.0–4.4)
Cholesterol, Total: 162 mg/dL (ref 100–199)
HDL: 45 mg/dL (ref >39)
LDL Chol Calc (NIH): 88 mg/dL (ref 0–99)
Triglycerides: 171 mg/dL — ABNORMAL HIGH (ref 0–149)
VLDL Cholesterol Cal: 29 mg/dL (ref 5–40)

## 2024-05-25 LAB — HEMOGLOBIN A1C
Est. average glucose Bld gHb Est-mCnc: 151 mg/dL
Hgb A1c MFr Bld: 6.9 % — ABNORMAL HIGH (ref 4.8–5.6)

## 2024-05-25 LAB — VITAMIN D 25 HYDROXY (VIT D DEFICIENCY, FRACTURES): Vit D, 25-Hydroxy: 42.3 ng/mL (ref 30.0–100.0)

## 2024-05-25 LAB — PARATHYROID HORMONE, INTACT (NO CA): PTH: 41 pg/mL (ref 15–65)

## 2024-05-28 ENCOUNTER — Ambulatory Visit: Payer: Self-pay | Admitting: Family Medicine

## 2024-05-28 DIAGNOSIS — N184 Chronic kidney disease, stage 4 (severe): Secondary | ICD-10-CM | POA: Diagnosis not present

## 2024-05-28 DIAGNOSIS — I10 Essential (primary) hypertension: Secondary | ICD-10-CM | POA: Diagnosis not present

## 2024-05-28 DIAGNOSIS — N189 Chronic kidney disease, unspecified: Secondary | ICD-10-CM | POA: Diagnosis not present

## 2024-05-28 DIAGNOSIS — E1122 Type 2 diabetes mellitus with diabetic chronic kidney disease: Secondary | ICD-10-CM | POA: Diagnosis not present

## 2024-05-29 NOTE — Progress Notes (Signed)
 [facility] - Clinic Note  06/12/2024   CHIEF COMPLAINT Patient presents for Retina Follow Up  HISTORY OF PRESENT ILLNESS: Hannah Odom is a 73 y.o. female who presents to the clinic today for:  HPI     Retina Follow Up   Patient presents with  Wet AMD.  In both eyes.  This started 5 weeks ago.  I, the attending physician,  performed the HPI with the patient and updated documentation appropriately.        Comments   Patient here for 5 weeks retina follow up for exu ARMD OU. Patient states vision in am sees good. Especially in bright light.  Later in the day eye get tired. If takes a nap for an hour helps a lot. No eye pain.      Last edited by Valdemar Rogue, MD on 06/12/2024  6:59 PM.    Patient states she sees better in the morning, everything is blurry close up. Pt has been using some drops-Refresh.   Referring physician: [name], OD [address] [place],  [place] [address]  HISTORICAL INFORMATION:  Selected notes from the MEDICAL RECORD NUMBER Referred by Dr. Nivia for exu ARMD OS LEE:  Ocular Hx- history of ex ARMD OD s/p IVA OD x17, last in 07.10.12 MICHAELL Pugh at [facility]) H/o refractive surgery with Dr. Meridee in early 2000s; h/o of sectoral retinitis pigmentosa OU PMH-   CURRENT MEDICATIONS: Current Outpatient Medications (Ophthalmic Drugs)  Medication Sig   brimonidine (ALPHAGAN P) 0.1 % SOLN Apply to eye.   No current facility-administered medications for this visit. (Ophthalmic Drugs)   Current Outpatient Medications (Other)  Medication Sig   acetaminophen (TYLENOL) 650 MG CR tablet Take by mouth.   ARIPiprazole (ABILIFY) 2 MG tablet Take by mouth.   calcipotriene (DOVONOX) 0.005 % cream Apply topically 2 (two) times daily.   Cholecalciferol 50 MCG (2000 UT) CAPS Take by mouth.   clobetasol cream (TEMOVATE) 0.05 % Apply topically 2 (two) times daily as needed.   DULoxetine (CYMBALTA) 60 MG capsule Take 60 mg by mouth 2 (two)  times daily.   ezetimibe (ZETIA) 10 MG tablet Take 10 mg by mouth daily.   furosemide (LASIX) 40 MG tablet Take 40 mg by mouth daily.   hydrOXYzine (ATARAX) 50 MG tablet Take by mouth.   JARDIANCE 10 MG TABS tablet Take 10 mg by mouth daily.   levothyroxine (SYNTHROID) 25 MCG tablet TK 1 T PO D   linaclotide (LINZESS) 145 MCG CAPS capsule Take by mouth.   lisinopril (ZESTRIL) 20 MG tablet Take by mouth.   mirtazapine (REMERON) 30 MG tablet Take by mouth.   MOUNJARO 2.5 MG/0.5ML Pen SMARTSIG:2.5 Milligram(s) SUB-Q Once a Week   MYRBETRIQ 50 MG TB24 tablet Take 50 mg by mouth daily.   omeprazole (PRILOSEC) 40 MG capsule Take 40 mg by mouth daily.   pravastatin (PRAVACHOL) 40 MG tablet Take 40 mg by mouth at bedtime.   tiZANidine (ZANAFLEX) 4 MG tablet Take by mouth.   valACYclovir (VALTREX) 500 MG tablet Take 500 mg by mouth daily.   No current facility-administered medications for this visit. (Other)   REVIEW OF SYSTEMS: ROS   Positive for: Endocrine, Eyes, Allergic/Imm Last edited by Orval Asberry RAMAN, COA on 06/12/2024  7:50 AM.     ALLERGIES No Known Allergies  PAST MEDICAL HISTORY Past Medical History:  Diagnosis Date   Diabetes mellitus without complication (HCC)    Hypertension  Macular degeneration    Past Surgical History:  Procedure Laterality Date   LASIK     FAMILY HISTORY History reviewed. No pertinent family history. SOCIAL HISTORY Social History   Tobacco Use   Smoking status: Never   Smokeless tobacco: Never  Vaping Use   Vaping status: Never Used  Substance Use Topics   Alcohol use: Never   Drug use: Never       OPHTHALMIC EXAM:  Base Eye Exam     Visual Acuity (Snellen - Linear)       Right Left   Dist [place] CF at 3' 20/40 -1         Tonometry (Tonopen, 7:47 AM)       Right Left   Pressure 15 11         Pupils       Dark Light Shape React APD   Right 3 2 Round Brisk None   Left 3 2 Round Brisk None         Visual Fields  (Counting fingers)       Left Right    Full    Restrictions  Partial inner superior temporal, inferior temporal, superior nasal, inferior nasal deficiencies         Extraocular Movement       Right Left    Full, Ortho Full, Ortho         Neuro/Psych     Oriented x3: Yes   Mood/Affect: Normal         Dilation     Both eyes: 1.0% Mydriacyl, 2.5% Phenylephrine @ 7:47 AM           Slit Lamp and Fundus Exam     External Exam       Right Left   External Normal Normal         Slit Lamp Exam       Right Left   Lids/Lashes Dermatochalasis - upper lid Dermatochalasis - upper lid   Conjunctiva/Sclera Trace Injection temporal Pinguecula   Cornea 4 cut RK, 2+ inferior Punctate epithelial erosions, arcus, punctate corneal haze @ 130, well healed cataract wound Arcus, focal corneal haze/scar superior midzone, tear film debris, 1+ Punctate epithelial erosions, well healed cataract wound   Anterior Chamber Deep and clear Deep and clear   Iris Round and dilated, No NVI Round and poorly dilated, No NVI   Lens PC IOL in good position PC IOL in good position   Anterior Vitreous Posterior vitreous detachment, vitreous condensations Posterior vitreous detachment         Fundus Exam       Right Left   Disc Pink and sharp, PPA Pink and sharp, mild PPP   C/D Ratio 0.2 0.2   Macula Flat, Blunted foveal reflex, central disciform scar with surrounding atrophy and pigment clumping, no heme or fluid Blunted foveal reflex, CNV with stable improvement in heme and edema nasal macula--stably resolved   Vessels attenuated, mild tortuosity attenuated, focal pigment clumping along superior arcades, mild tortuosity   Periphery Attached, rare MA, pigmented CR atrophy nasal midzone Attached, No heme, mild reticular degeneration, focal bone spicules nasal quad and RPE atrophy           IMAGING AND PROCEDURES  Imaging and Procedures for 06/12/2024  OCT, Retina - OU - Both Eyes        Right Eye Quality was good. Central Foveal Thickness: 269. Progression has been stable. Findings include no IRF, no SRF, abnormal foveal contour, retinal  drusen , outer retinal tubulation, subretinal hyper-reflective material, pigment epithelial detachment (Central disciform scar, no fluid, no DME).   Left Eye Quality was good. Central Foveal Thickness: 296. Progression has been stable. Findings include no IRF, abnormal foveal contour, retinal drusen , subretinal hyper-reflective material, pigment epithelial detachment, outer retinal atrophy (Stable improvement in CNV/PED, SRHM and edema nasal macula, no DME).   Notes *Images captured and stored on drive  Diagnosis / Impression:  Exudative ARMD OU OD: Central disciform scar, no fluid, no DME OS: Stable improvement in CNV/PED, SRHM, and edema nasal macula, no DME  Clinical management:  See below  Abbreviations: NFP - Normal foveal profile. CME - cystoid macular edema. PED - pigment epithelial detachment. IRF - intraretinal fluid. SRF - subretinal fluid. EZ - ellipsoid zone. ERM - epiretinal membrane. ORA - outer retinal atrophy. ORT - outer retinal tubulation. SRHM - subretinal hyper-reflective material. IRHM - intraretinal hyper-reflective material      Intravitreal Injection, Pharmacologic Agent - OS - Left Eye       Time Out 06/12/2024. 8:04 AM. Confirmed correct patient, procedure, site, and patient consented.   Anesthesia Topical anesthesia was used. Anesthetic medications included Lidocaine 2%, Proparacaine 0.5%.   Procedure Preparation included 5% betadine to ocular surface, eyelid speculum. A supplied (32g) needle was used.   Injection: 1.25 mg Bevacizumab  1.25mg /0.32ml   Route: Intravitreal, Site: Left Eye   NDC: [id number], Lot: [id number], Expiration date: 06/24/2024   Post-op Post injection exam found visual acuity of at least counting fingers. The patient tolerated the procedure well. There were no complications.  The patient received written and verbal post procedure care education.           ASSESSMENT/PLAN:   ICD-10-CM   1. Exudative age-related macular degeneration of left eye with active choroidal neovascularization (HCC)  H35.3221 OCT, Retina - OU - Both Eyes    Intravitreal Injection, Pharmacologic Agent - OS - Left Eye    Bevacizumab  (AVASTIN ) SOLN 1.25 mg    2. Exudative age-related macular degeneration of right eye with inactive scar (HCC)  H35.3213     3. Diabetes mellitus type 2 without retinopathy (HCC)  E11.9     4. Diabetes mellitus treated with oral medication (HCC)  E11.9    Z79.84     5. Diabetes mellitus treated with injections of non-insulin medication (HCC)  E11.9    Z79.85     6. Essential hypertension  I10     7. Hypertensive retinopathy of both eyes  H35.033     8. Pseudophakia of both eyes  Z96.1      1,2 .Exudative age related macular degeneration, OU  - OD chronic disciform scar / inactive  - OS more recently converted  - previously managed at [facility] by Dr. Cheree  - s/p IVA OS #1 (04.08.25), #2 (05.06.25), #3 (06.03.25), #4 (07.01.25), #5 (07.29.25), #6 (08.26.25), #7 (09.30.25)  - h/o ex ARMD OD - s/p IVA OD x12, last injxn 2012 -- now w/ central disciform scar / atrophy  - OS with recent conversion -- pt reports decreased vision OS x several months  - BCVA OD stable at CF, OS 20/40 stable   - OCT OD: Central disciform scar, no fluid, no DME; OS: stable improvement in CNV/PED, SRHM, and edema nasal macula, no DM at 5 wks  - recommend IVA OS #8 today 11.04.25 with follow up ext to 6 weeks - pt wishes to be treated with IVA OS - RBA of  procedure discussed, questions answered - informed consent obtained and signed 04.08.25  - see procedure note - Eylea approved but pt will owe 20% -- pt not opposed to covering cost if needed  - f/u in 6 wks -- DFE/OCT, possible injection   3-5. Diabetes mellitus, type 2 without retinopathy  - A1C  7.6 on 06.12.25 - The incidence, risk factors for progression, natural history and treatment options for diabetic retinopathy  were discussed with patient.   - The need for close monitoring of blood glucose, blood pressure, and serum lipids, avoiding cigarette or any type of tobacco, and the need for long term follow up was also discussed with patient. - f/u in 1 year, sooner prn   6,7. Hypertensive retinopathy OU - discussed importance of tight BP control - monitor  8. Pseudophakia OU  - s/p CE/IOL OU (Dr. Medford Gaudy)  - IOL in good position, doing well  - monitor   Ophthalmic Meds Ordered this visit:  Meds ordered this encounter  Medications   Bevacizumab  (AVASTIN ) SOLN 1.25 mg     Return in about 6 weeks (around 07/24/2024) for exu ARMD OD, DFE, OCT, Possible Injxn.  There are no Patient Instructions on file for this visit.  Explained the diagnoses, plan, and follow up with the patient and they expressed understanding.  Patient expressed understanding of the importance of proper follow up care.   This document serves as a record of services personally performed by Redell JUDITHANN Hans, MD, PhD. It was created on their behalf by Wanda GEANNIE Keens, COT an ophthalmic technician. The creation of this record is the provider's dictation and/or activities during the visit.    Electronically signed by:  Wanda GEANNIE Keens, COT  06/12/24 7:00 PM  This document serves as a record of services personally performed by Redell JUDITHANN Hans, MD, PhD. It was created on their behalf by Almetta Pesa, an ophthalmic technician. The creation of this record is the provider's dictation and/or activities during the visit.    Electronically signed by: Almetta Pesa, OA, 06/12/24  7:00 PM  Redell JUDITHANN Hans, M.D., Ph.D. Diseases & Surgery of the Retina and Vitreous [facility]  I have reviewed the above documentation for accuracy and completeness, and I agree with the above.  Redell JUDITHANN Hans, M.D., Ph.D. 06/12/24 7:01 PM   Abbreviations: M myopia (nearsighted); A astigmatism; H hyperopia (farsighted); P presbyopia; Mrx spectacle prescription;  CTL contact lenses; OD right eye; OS left eye; OU both eyes  XT exotropia; ET esotropia; PEK punctate epithelial keratitis; PEE punctate epithelial erosions; DES dry eye syndrome; MGD meibomian gland dysfunction; ATs artificial tears; PFAT's preservative free artificial tears; NSC nuclear sclerotic cataract; PSC posterior subcapsular cataract; ERM epi-retinal membrane; PVD posterior vitreous detachment; RD retinal detachment; DM diabetes mellitus; DR diabetic retinopathy; NPDR non-proliferative diabetic retinopathy; PDR proliferative diabetic retinopathy; CSME clinically significant macular edema; DME diabetic macular edema; dbh dot blot hemorrhages; CWS cotton wool spot; POAG primary open angle glaucoma; C/D cup-to-disc ratio; HVF humphrey visual field; GVF goldmann visual field; OCT optical coherence tomography; IOP intraocular pressure; BRVO Branch retinal vein occlusion; CRVO central retinal vein occlusion; CRAO central retinal artery occlusion; BRAO branch retinal artery occlusion; RT retinal tear; SB scleral buckle; PPV pars plana vitrectomy; VH Vitreous hemorrhage; PRP panretinal laser photocoagulation; IVK intravitreal kenalog; VMT vitreomacular traction; MH Macular hole;  NVD neovascularization of the disc; NVE neovascularization elsewhere; AREDS age related eye disease study; ARMD age related macular degeneration; POAG primary open  angle glaucoma; EBMD epithelial/anterior basement membrane dystrophy; ACIOL anterior chamber intraocular lens; IOL intraocular lens; PCIOL posterior chamber intraocular lens; Phaco/IOL phacoemulsification with intraocular lens placement; PRK photorefractive keratectomy; LASIK laser assisted in situ keratomileusis; HTN hypertension; DM diabetes mellitus; COPD chronic obstructive pulmonary disease

## 2024-06-06 NOTE — Telephone Encounter (Signed)
 Received patient portion in clinic today. Faxed to PAP team.  Alper Guilmette E. Marsh, PharmD, CPP Clinical Pharmacist [facility] [phone]

## 2024-06-07 ENCOUNTER — Other Ambulatory Visit (HOSPITAL_COMMUNITY): Payer: Self-pay

## 2024-06-07 NOTE — Telephone Encounter (Signed)
 Received pt portion pap [employer](Trulicity ) along proof of income

## 2024-06-08 ENCOUNTER — Ambulatory Visit (INDEPENDENT_AMBULATORY_CARE_PROVIDER_SITE_OTHER): Admitting: Urology

## 2024-06-08 ENCOUNTER — Encounter: Payer: Self-pay | Admitting: Urology

## 2024-06-08 VITALS — BP 129/76 | HR 78 | Ht 67.0 in | Wt 147.0 lb

## 2024-06-08 DIAGNOSIS — N281 Cyst of kidney, acquired: Secondary | ICD-10-CM | POA: Diagnosis not present

## 2024-06-08 NOTE — Progress Notes (Signed)
 06/08/2024 11:31 AM   Adrien Silvan [birth year] [id number]  Referring provider: Gasper Nancyann BRAVO, MD [address] [place],  [place] [address]  Chief Complaint  Patient presents with   Results   Urologic history:  1.  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 female who presents for follow-up visit.  Initially found to have a Bosniak 3 right renal cyst measuring 2.3 x 2.1 cm on MR liver 2021.  She elected surveillance and was seen by IR 06/15/2022 and was not felt a candidate for percutaneous ablation due to anterior location and proximity to the duodenum  MRI 10/2022 showed slight interval size increase to 2.6 x 2.3 with subsequent increased to 2.9 x 2.12 May 2023 Last 2 MRIs 08/25/2023 with size 2.8 x 2.3 cm and most recent MRI 05/09/2024 at 2.6 x 3.0 cm No gross hematuria or flank, abdominal, pelvic pain Followed by nephrology for CKD stage IV with last creatinine/GFR on 05/23/2024 at 2.28/22 Saw Dr. Devere 12/06/2022 who felt based on location partial nephrectomy would be technically challenging and the likelihood of radical nephrectomy would be 30-40% and surveillance was recommended   PMH: Past Medical History:  Diagnosis Date   BRCA negative 11/15/2012   [facility]   Diabetes mellitus without complication (HCC)    History of chicken pox    Hypertension     Surgical History: Past Surgical History:  Procedure Laterality Date   FOOT SURGERY     IR RADIOLOGIST EVAL & MGMT  06/15/2022   IR RADIOLOGIST EVAL & MGMT  05/20/2023    Home Medications:  Allergies as of 06/08/2024       Reactions   Pioglitazone     Dizziness        Medication List        Accurate as of June 08, 2024 11:31 AM. If you have any questions, ask your nurse or doctor.          STOP taking these medications    glipiZIDE  2.5 MG 24 hr tablet Commonly known as: GLUCOTROL  XL   linaclotide  145 MCG Caps capsule Commonly known as:  Linzess    True Metrix Blood Glucose Test test strip Generic drug: glucose blood   TRUEplus Lancets 33G Misc       TAKE these medications    amLODipine  5 MG tablet Commonly known as: NORVASC  TAKE 1 TABLET DAILY   aspirin EC 81 MG tablet Take 81 mg by mouth daily.   betamethasone  dipropionate 0.05 % cream Apply topically 2 (two) times daily as needed.   cyclobenzaprine  5 MG tablet Commonly known as: FLEXERIL  TAKE 1-2 TABLETS BY MOUTH 3 TIMES DAILY AS NEEDED FOR MUSCLE SPASMS.   empagliflozin 10 MG Tabs tablet Commonly known as: JARDIANCE Take 10 mg by mouth.   FreeStyle Libre 3 Sensor Misc 1 each by Does not apply route every 14 (fourteen) days.   ketoconazole  2 % cream Commonly known as: NIZORAL  Apply 1 Application topically 2 (two) times daily. Until clear   lovastatin  20 MG tablet Commonly known as: MEVACOR  TAKE 1 TABLET EVERY EVENING   meclizine  12.5 MG tablet Commonly known as: ANTIVERT  TAKE 1 TABLET (12.5 MG TOTAL) BY MOUTH 2 (TWO) TIMES DAILY AS NEEDED FOR DIZZINESS.   mupirocin  cream 2 % Commonly known as: BACTROBAN  Apply 1 application topically 2 (two) times daily.   neomycin -polymyxin-hydrocortisone OTIC solution Commonly known as: CORTISPORIN Place 3 drops into both ears 2 (two) times daily  as needed (itching).   tiZANidine 2 MG tablet Commonly known as: ZANAFLEX Take 2 mg by mouth every 6 (six) hours as needed.   triamcinolone  cream 0.5 % Commonly known as: KENALOG  Apply 1 application topically 2 (two) times daily.   Trulicity  3 MG/0.5ML Soaj Generic drug: Dulaglutide  Inject 3 mg as directed once a week.   verapamil  240 MG CR tablet Commonly known as: CALAN -SR TAKE 1 TABLET BY MOUTH AT BEDTIME.   Vitamin D -3 125 MCG (5000 UT) Tabs Take 5,000 Units by mouth daily.        Allergies:  Allergies  Allergen Reactions   Pioglitazone      Dizziness    Family History: Family History  Problem Relation Age of Onset   Breast cancer  Mother 93   Breast cancer Sister 22   Cancer Sister        renal     Social History:  reports that she has quit smoking. Her smoking use included cigarettes. She has a 4 pack-year smoking history. She has never used smokeless tobacco. She reports that she does not drink alcohol and does not use drugs.   Physical Exam: BP 129/76   Pulse 78   Ht 5' 7 (1.702 m)   Wt 147 lb (66.7 kg)   BMI 23.02 kg/m   Constitutional:  Alert, No acute distress. HEENT: [place] AT Respiratory: Normal respiratory effort, no increased work of breathing. Psychiatric: Normal mood and affect.   Assessment & Plan:    1.  Bosniak 3 right renal cyst Slight interval enlargement Not a candidate for percutaneous ablation based on location She has elected to continue surveillance and will plan follow-up imaging 9 months   Glendia JAYSON Barba, MD  [facility] [address], [address] [place], [place] [address] [phone]

## 2024-06-10 ENCOUNTER — Other Ambulatory Visit: Payer: Self-pay | Admitting: Family Medicine

## 2024-06-10 DIAGNOSIS — I10 Essential (primary) hypertension: Secondary | ICD-10-CM

## 2024-06-11 ENCOUNTER — Encounter: Payer: Self-pay | Admitting: Urology

## 2024-06-12 ENCOUNTER — Encounter (INDEPENDENT_AMBULATORY_CARE_PROVIDER_SITE_OTHER): Payer: Self-pay | Admitting: Ophthalmology

## 2024-06-12 ENCOUNTER — Ambulatory Visit (INDEPENDENT_AMBULATORY_CARE_PROVIDER_SITE_OTHER): Admitting: Ophthalmology

## 2024-06-12 DIAGNOSIS — H35033 Hypertensive retinopathy, bilateral: Secondary | ICD-10-CM

## 2024-06-12 DIAGNOSIS — H353221 Exudative age-related macular degeneration, left eye, with active choroidal neovascularization: Secondary | ICD-10-CM

## 2024-06-12 DIAGNOSIS — E119 Type 2 diabetes mellitus without complications: Secondary | ICD-10-CM | POA: Diagnosis not present

## 2024-06-12 DIAGNOSIS — Z961 Presence of intraocular lens: Secondary | ICD-10-CM

## 2024-06-12 DIAGNOSIS — Z7984 Long term (current) use of oral hypoglycemic drugs: Secondary | ICD-10-CM | POA: Diagnosis not present

## 2024-06-12 DIAGNOSIS — H353213 Exudative age-related macular degeneration, right eye, with inactive scar: Secondary | ICD-10-CM

## 2024-06-12 DIAGNOSIS — Z7985 Long-term (current) use of injectable non-insulin antidiabetic drugs: Secondary | ICD-10-CM

## 2024-06-12 DIAGNOSIS — I10 Essential (primary) hypertension: Secondary | ICD-10-CM

## 2024-06-12 MED ORDER — BEVACIZUMAB CHEMO INJECTION 1.25MG/0.05ML SYRINGE FOR KALEIDOSCOPE
1.2500 mg | INTRAVITREAL | Status: AC | PRN
  Administered 2024-06-12: 1.25 mg via INTRAVITREAL

## 2024-06-21 ENCOUNTER — Ambulatory Visit
Admission: RE | Admit: 2024-06-21 | Discharge: 2024-06-21 | Disposition: A | Source: Ambulatory Visit | Attending: Urology | Admitting: Urology

## 2024-06-21 DIAGNOSIS — N281 Cyst of kidney, acquired: Secondary | ICD-10-CM

## 2024-06-21 MED ORDER — GADOPICLENOL 0.5 MMOL/ML IV SOLN
7.5000 mL | Freq: Once | INTRAVENOUS | Status: AC | PRN
  Administered 2024-06-21: 7 mL via INTRAVENOUS

## 2024-06-22 ENCOUNTER — Ambulatory Visit: Payer: Self-pay | Admitting: Urology

## 2024-06-25 ENCOUNTER — Other Ambulatory Visit: Payer: Self-pay | Admitting: *Deleted

## 2024-06-25 DIAGNOSIS — N2889 Other specified disorders of kidney and ureter: Secondary | ICD-10-CM

## 2024-06-25 NOTE — Telephone Encounter (Signed)
 Patient states imaging called her and I advised patient that I put another order in for 8 month to have this can done again .SABRA

## 2024-07-10 NOTE — Progress Notes (Signed)
 [facility] - Clinic Note  07/24/2024   CHIEF COMPLAINT Patient presents for Retina Follow Up  HISTORY OF PRESENT ILLNESS: Hannah Odom is a 73 y.o. female who presents to the clinic today for:  HPI     Retina Follow Up   Patient presents with  Wet AMD.  In left eye.  This started 6 weeks ago.        Comments   Patient here for 6 weeks retina follow up for exu ARMD OS. Patient states vision is better this month. Had a headache in past that lasted all month. This past injection didn't have a headache this month. No eye pain.       Last edited by Orval Asberry RAMAN, COA on 07/24/2024  8:32 AM.     Patient states she is doing well and her vision is the same.   Referring physician: [name], OD [address] [place],  [place] [address]  HISTORICAL INFORMATION:  Selected notes from the MEDICAL RECORD NUMBER Referred by Dr. Nivia for exu ARMD OS LEE:  Ocular Hx- history of ex ARMD OD s/p IVA OD x17, last in 07.10.12 MICHAELL Pugh at [facility]) H/o refractive surgery with Dr. Meridee in early 2000s; h/o of sectoral retinitis pigmentosa OU PMH-   CURRENT MEDICATIONS: Current Outpatient Medications (Ophthalmic Drugs)  Medication Sig   brimonidine (ALPHAGAN P) 0.1 % SOLN Apply to eye.   No current facility-administered medications for this visit. (Ophthalmic Drugs)   Current Outpatient Medications (Other)  Medication Sig   acetaminophen (TYLENOL) 650 MG CR tablet Take by mouth.   ARIPiprazole (ABILIFY) 2 MG tablet Take by mouth.   calcipotriene (DOVONOX) 0.005 % cream Apply topically 2 (two) times daily.   Cholecalciferol 50 MCG (2000 UT) CAPS Take by mouth.   clobetasol cream (TEMOVATE) 0.05 % Apply topically 2 (two) times daily as needed.   DULoxetine (CYMBALTA) 60 MG capsule Take 60 mg by mouth 2 (two) times daily.   ezetimibe (ZETIA) 10 MG tablet Take 10 mg by mouth daily.   furosemide (LASIX) 40 MG tablet Take 40 mg by mouth daily.   hydrOXYzine  (ATARAX) 50 MG tablet Take by mouth.   JARDIANCE 10 MG TABS tablet Take 10 mg by mouth daily.   levothyroxine (SYNTHROID) 25 MCG tablet TK 1 T PO D   linaclotide (LINZESS) 145 MCG CAPS capsule Take by mouth.   lisinopril (ZESTRIL) 20 MG tablet Take by mouth.   mirtazapine (REMERON) 30 MG tablet Take by mouth.   MOUNJARO 2.5 MG/0.5ML Pen SMARTSIG:2.5 Milligram(s) SUB-Q Once a Week   MYRBETRIQ 50 MG TB24 tablet Take 50 mg by mouth daily.   omeprazole (PRILOSEC) 40 MG capsule Take 40 mg by mouth daily.   pravastatin (PRAVACHOL) 40 MG tablet Take 40 mg by mouth at bedtime.   tiZANidine (ZANAFLEX) 4 MG tablet Take by mouth.   valACYclovir (VALTREX) 500 MG tablet Take 500 mg by mouth daily.   No current facility-administered medications for this visit. (Other)   REVIEW OF SYSTEMS: ROS   Positive for: Endocrine, Eyes, Allergic/Imm Last edited by Orval Asberry RAMAN, COA on 07/24/2024  8:32 AM.      ALLERGIES No Known Allergies  PAST MEDICAL HISTORY Past Medical History:  Diagnosis Date   Diabetes mellitus without complication (HCC)    Hypertension    Macular degeneration    Past Surgical History:  Procedure Laterality Date   LASIK     FAMILY HISTORY History reviewed.  No pertinent family history. SOCIAL HISTORY Social History   Tobacco Use   Smoking status: Never   Smokeless tobacco: Never  Vaping Use   Vaping status: Never Used  Substance Use Topics   Alcohol use: Never   Drug use: Never       OPHTHALMIC EXAM:  Base Eye Exam     Visual Acuity (Snellen - Linear)       Right Left   Dist [place] CF at 3' 20/40 -1         Tonometry (Tonopen, 8:28 AM)       Right Left   Pressure 14 12         Pupils       Dark Light Shape React APD   Right 3 2 Round Brisk None   Left 3 2 Round Brisk None         Visual Fields (Counting fingers)       Left Right    Full    Restrictions  Partial inner superior temporal, inferior temporal, superior nasal, inferior nasal  deficiencies         Extraocular Movement       Right Left    Full, Ortho Full, Ortho         Neuro/Psych     Oriented x3: Yes   Mood/Affect: Normal         Dilation     Both eyes: 1.0% Mydriacyl, 2.5% Phenylephrine @ 8:28 AM           Slit Lamp and Fundus Exam     External Exam       Right Left   External Normal Normal         Slit Lamp Exam       Right Left   Lids/Lashes Dermatochalasis - upper lid Dermatochalasis - upper lid   Conjunctiva/Sclera Trace Injection temporal Pinguecula   Cornea 4 cut RK, 2+ inferior Punctate epithelial erosions, arcus, punctate corneal haze @ 130, well healed cataract wound Arcus, focal corneal haze/scar superior midzone, tear film debris, 1+ Punctate epithelial erosions, well healed cataract wound   Anterior Chamber Deep and clear Deep and clear   Iris Round and dilated, No NVI Round and poorly dilated, No NVI   Lens PC IOL in good position PC IOL in good position   Anterior Vitreous Posterior vitreous detachment, vitreous condensations Posterior vitreous detachment         Fundus Exam       Right Left   Disc Pink and sharp, PPA Pink and sharp, mild PPP   C/D Ratio 0.2 0.2   Macula Flat, Blunted foveal reflex, central disciform scar with surrounding atrophy and pigment clumping, no heme or fluid Blunted foveal reflex, CNV with stable improvement in heme and edema nasal macula--stably resolved   Vessels attenuated, mild tortuosity attenuated, focal pigment clumping along superior arcades, mild tortuosity   Periphery Attached, rare MA, pigmented CR atrophy nasal midzone Attached, No heme, mild reticular degeneration, focal bone spicules nasal quad and RPE atrophy           IMAGING AND PROCEDURES  Imaging and Procedures for 07/24/2024  OCT, Retina - OU - Both Eyes       Right Eye Quality was good. Central Foveal Thickness: 269. Progression has been stable. Findings include no IRF, no SRF, abnormal foveal contour,  retinal drusen , outer retinal tubulation, subretinal hyper-reflective material, pigment epithelial detachment (Central disciform scar, no fluid, no DME).   Left Eye Quality  was good. Central Foveal Thickness: 296. Progression has been stable. Findings include no IRF, abnormal foveal contour, retinal drusen , subretinal hyper-reflective material, pigment epithelial detachment, outer retinal atrophy (Stable improvement in CNV/PED, SRHM and edema nasal macula, no DME).   Notes *Images captured and stored on drive  Diagnosis / Impression:  Exudative ARMD OU OD: Central disciform scar, no fluid, no DME OS: Stable improvement in CNV/PED, SRHM, and edema nasal macula, no DME  Clinical management:  See below  Abbreviations: NFP - Normal foveal profile. CME - cystoid macular edema. PED - pigment epithelial detachment. IRF - intraretinal fluid. SRF - subretinal fluid. EZ - ellipsoid zone. ERM - epiretinal membrane. ORA - outer retinal atrophy. ORT - outer retinal tubulation. SRHM - subretinal hyper-reflective material. IRHM - intraretinal hyper-reflective material            ASSESSMENT/PLAN:   ICD-10-CM   1. Exudative age-related macular degeneration of left eye with active choroidal neovascularization (HCC)  H35.3221 OCT, Retina - OU - Both Eyes    2. Exudative age-related macular degeneration of right eye with inactive scar (HCC)  H35.3213     3. Diabetes mellitus type 2 without retinopathy (HCC)  E11.9     4. Diabetes mellitus treated with oral medication (HCC)  E11.9    Z79.84     5. Diabetes mellitus treated with injections of non-insulin medication (HCC)  E11.9    Z79.85     6. Essential hypertension  I10     7. Hypertensive retinopathy of both eyes  H35.033     8. Pseudophakia of both eyes  Z96.1       1,2 .Exudative age related macular degeneration, OU  - OD chronic disciform scar / inactive  - OS more recently converted - previously managed at [facility] by Dr. Cheree - s/p IVA OS #1 (04.08.25), #2 (05.06.25), #3 (06.03.25), #4 (07.01.25), #5 (07.29.25), #6 (08.26.25), #7 (09.30.25), #8 (11.04.25) - h/o ex ARMD OD - s/p IVA OD x12, last injxn 2012 -- now w/ central disciform scar / atrophy - OS with recent conversion -- pt reports decreased vision OS x several months  - BCVA OD stable at CF, OS 20/40 stable  - OCT OD: Central disciform scar, no fluid, no DME; OS: stable improvement in CNV/PED, SRHM, and edema nasal macula, no DM at 6 wks - recommend IVA OS #9 today 12.16.25 with follow up ext to 8 weeks - pt wishes to be treated with IVA OS - RBA of procedure discussed, questions answered - informed consent obtained and signed 04.08.25  - see procedure note - Eylea approved but pt will owe 20% -- pt not opposed to covering cost if needed  - f/u in 8 wks -- DFE/OCT, possible injection   3-5. Diabetes mellitus, type 2 without retinopathy  - A1C 7.6 on 06.12.25 - The incidence, risk factors for progression, natural history and treatment options for diabetic retinopathy  were discussed with patient.   - The need for close monitoring of blood glucose, blood pressure, and serum lipids, avoiding cigarette or any type of tobacco, and the need for long term follow up was also discussed with patient. - f/u in 1 year, sooner prn   6,7. Hypertensive retinopathy OU - discussed importance of tight BP control - monitor  8. Pseudophakia OU  - s/p CE/IOL OU (Dr. Medford Gaudy)  - IOL in good position, doing well  - monitor   Ophthalmic Meds Ordered this visit:  No orders of the defined types were placed in this encounter.    No follow-ups on file.  There are no Patient Instructions on file for this visit.  Explained the diagnoses, plan, and follow up with the patient and they expressed understanding.  Patient expressed understanding of the importance of proper follow up care.   This document serves as a record of services personally  performed by Redell JUDITHANN Hans, MD, PhD. It was created on their behalf by Wanda GEANNIE Keens, COT an ophthalmic technician. The creation of this record is the provider's dictation and/or activities during the visit.    Electronically signed by:  Wanda GEANNIE Keens, COT  07/24/2024 9:24 AM  This document serves as a record of services personally performed by Redell JUDITHANN Hans, MD, PhD. It was created on their behalf by Almetta Pesa, an ophthalmic technician. The creation of this record is the provider's dictation and/or activities during the visit.    Electronically signed by: Almetta Pesa, OA, 07/24/2024  9:24 AM  Redell JUDITHANN Hans, M.D., Ph.D. Diseases & Surgery of the Retina and Vitreous [facility]    Abbreviations: M myopia (nearsighted); A astigmatism; H hyperopia (farsighted); P presbyopia; Mrx spectacle prescription;  CTL contact lenses; OD right eye; OS left eye; OU both eyes  XT exotropia; ET esotropia; PEK punctate epithelial keratitis; PEE punctate epithelial erosions; DES dry eye syndrome; MGD meibomian gland dysfunction; ATs artificial tears; PFAT's preservative free artificial tears; NSC nuclear sclerotic cataract; PSC posterior subcapsular cataract; ERM epi-retinal membrane; PVD posterior vitreous detachment; RD retinal detachment; DM diabetes mellitus; DR diabetic retinopathy; NPDR non-proliferative diabetic retinopathy; PDR proliferative diabetic retinopathy; CSME clinically significant macular edema; DME diabetic macular edema; dbh dot blot hemorrhages; CWS cotton wool spot; POAG primary open angle glaucoma; C/D cup-to-disc ratio; HVF humphrey visual field; GVF goldmann visual field; OCT optical coherence tomography; IOP intraocular pressure; BRVO Branch retinal vein occlusion; CRVO central retinal vein occlusion; CRAO central retinal artery occlusion; BRAO branch retinal artery occlusion; RT retinal tear; SB scleral buckle; PPV pars plana vitrectomy; VH  Vitreous hemorrhage; PRP panretinal laser photocoagulation; IVK intravitreal kenalog; VMT vitreomacular traction; MH Macular hole;  NVD neovascularization of the disc; NVE neovascularization elsewhere; AREDS age related eye disease study; ARMD age related macular degeneration; POAG primary open angle glaucoma; EBMD epithelial/anterior basement membrane dystrophy; ACIOL anterior chamber intraocular lens; IOL intraocular lens; PCIOL posterior chamber intraocular lens; Phaco/IOL phacoemulsification with intraocular lens placement; PRK photorefractive keratectomy; LASIK laser assisted in situ keratomileusis; HTN hypertension; DM diabetes mellitus; COPD chronic obstructive pulmonary disease

## 2024-07-24 ENCOUNTER — Ambulatory Visit (INDEPENDENT_AMBULATORY_CARE_PROVIDER_SITE_OTHER): Admitting: Ophthalmology

## 2024-07-24 ENCOUNTER — Encounter (INDEPENDENT_AMBULATORY_CARE_PROVIDER_SITE_OTHER): Payer: Self-pay | Admitting: Ophthalmology

## 2024-07-24 DIAGNOSIS — I10 Essential (primary) hypertension: Secondary | ICD-10-CM | POA: Diagnosis not present

## 2024-07-24 DIAGNOSIS — Z7985 Long-term (current) use of injectable non-insulin antidiabetic drugs: Secondary | ICD-10-CM

## 2024-07-24 DIAGNOSIS — Z961 Presence of intraocular lens: Secondary | ICD-10-CM

## 2024-07-24 DIAGNOSIS — H35033 Hypertensive retinopathy, bilateral: Secondary | ICD-10-CM

## 2024-07-24 DIAGNOSIS — E119 Type 2 diabetes mellitus without complications: Secondary | ICD-10-CM | POA: Diagnosis not present

## 2024-07-24 DIAGNOSIS — H353213 Exudative age-related macular degeneration, right eye, with inactive scar: Secondary | ICD-10-CM | POA: Diagnosis not present

## 2024-07-24 DIAGNOSIS — Z7984 Long term (current) use of oral hypoglycemic drugs: Secondary | ICD-10-CM | POA: Diagnosis not present

## 2024-07-24 DIAGNOSIS — H353221 Exudative age-related macular degeneration, left eye, with active choroidal neovascularization: Secondary | ICD-10-CM | POA: Diagnosis not present

## 2024-07-24 MED ORDER — BEVACIZUMAB CHEMO INJECTION 1.25MG/0.05ML SYRINGE FOR KALEIDOSCOPE
1.2500 mg | INTRAVITREAL | Status: AC | PRN
  Administered 2024-07-24: 18:00:00 1.25 mg via INTRAVITREAL

## 2024-08-03 NOTE — Telephone Encounter (Signed)
 Contacted [employer] for update on patient assistance application for Trul[employer] . Per representative, patient was approved through 08/08/25.  Frieda Arnall E. Marsh, PharmD, CPP Clinical Pharmacist [facility] [phone]

## 2024-08-22 ENCOUNTER — Telehealth: Payer: Self-pay | Admitting: Family Medicine

## 2024-08-22 MED ORDER — FREESTYLE LIBRE 3 SENSOR MISC: 1.0000 | 3 refills | Status: AC

## 2024-08-22 NOTE — Telephone Encounter (Signed)
 Prescription sent in.

## 2024-08-22 NOTE — Telephone Encounter (Signed)
 [facility] faxed refill request for the following medications:  Continuous Glucose Sensor (FREESTYLE LIBRE 3 SENSOR) MISC     Please advise.

## 2024-08-31 ENCOUNTER — Other Ambulatory Visit: Payer: Self-pay | Admitting: Family Medicine

## 2024-08-31 DIAGNOSIS — I471 Supraventricular tachycardia, unspecified: Secondary | ICD-10-CM

## 2024-09-04 NOTE — Progress Notes (Shared)
 " [facility] - Clinic Note  09/18/2024   CHIEF COMPLAINT Patient presents for No chief complaint on file.  HISTORY OF PRESENT ILLNESS: Hannah Odom is a 74 y.o. female who presents to the clinic today for:    Patient states she is doing well and her vision is the same.   Referring physician: [name], OD [address] [place],  [place] [address]  HISTORICAL INFORMATION:  Selected notes from the MEDICAL RECORD NUMBER Referred by Dr. Nivia for exu ARMD OS LEE:  Ocular Hx- history of ex ARMD OD s/p IVA OD x17, last in 07.10.12 MICHAELL Pugh at [facility]) H/o refractive surgery with Dr. Meridee in early 2000s; h/o of sectoral retinitis pigmentosa OU PMH-   CURRENT MEDICATIONS: Current Outpatient Medications (Ophthalmic Drugs)  Medication Sig   brimonidine (ALPHAGAN P) 0.1 % SOLN Apply to eye.   No current facility-administered medications for this visit. (Ophthalmic Drugs)   Current Outpatient Medications (Other)  Medication Sig   acetaminophen (TYLENOL) 650 MG CR tablet Take by mouth.   ARIPiprazole (ABILIFY) 2 MG tablet Take by mouth.   calcipotriene (DOVONOX) 0.005 % cream Apply topically 2 (two) times daily.   Cholecalciferol 50 MCG (2000 UT) CAPS Take by mouth.   clobetasol cream (TEMOVATE) 0.05 % Apply topically 2 (two) times daily as needed.   DULoxetine (CYMBALTA) 60 MG capsule Take 60 mg by mouth 2 (two) times daily.   ezetimibe (ZETIA) 10 MG tablet Take 10 mg by mouth daily.   furosemide (LASIX) 40 MG tablet Take 40 mg by mouth daily.   hydrOXYzine (ATARAX) 50 MG tablet Take by mouth.   JARDIANCE 10 MG TABS tablet Take 10 mg by mouth daily.   levothyroxine (SYNTHROID) 25 MCG tablet TK 1 T PO D   linaclotide (LINZESS) 145 MCG CAPS capsule Take by mouth.   lisinopril (ZESTRIL) 20 MG tablet Take by mouth.   mirtazapine (REMERON) 30 MG tablet Take by mouth.   MOUNJARO 2.5 MG/0.5ML Pen SMARTSIG:2.5 Milligram(s) SUB-Q Once a Week   MYRBETRIQ 50 MG  TB24 tablet Take 50 mg by mouth daily.   omeprazole (PRILOSEC) 40 MG capsule Take 40 mg by mouth daily.   pravastatin (PRAVACHOL) 40 MG tablet Take 40 mg by mouth at bedtime.   tiZANidine (ZANAFLEX) 4 MG tablet Take by mouth.   valACYclovir (VALTREX) 500 MG tablet Take 500 mg by mouth daily.   No current facility-administered medications for this visit. (Other)   REVIEW OF SYSTEMS:    ALLERGIES No Known Allergies  PAST MEDICAL HISTORY Past Medical History:  Diagnosis Date   Diabetes mellitus without complication (HCC)    Hypertension    Macular degeneration    Past Surgical History:  Procedure Laterality Date   LASIK     FAMILY HISTORY No family history on file. SOCIAL HISTORY Social History   Tobacco Use   Smoking status: Never   Smokeless tobacco: Never  Vaping Use   Vaping status: Never Used  Substance Use Topics   Alcohol use: Never   Drug use: Never       OPHTHALMIC EXAM:  Not recorded    IMAGING AND PROCEDURES  Imaging and Procedures for 09/18/2024         ASSESSMENT/PLAN: No diagnosis found.  1,2 .Exudative age related macular degeneration, OU  - OD chronic disciform scar / inactive  - OS more recently converted - previously managed at [facility] by Dr. Greven - s/p  IVA OS #1 (04.08.25), #2 (05.06.25), #3 (06.03.25), #4 (07.01.25), #5 (07.29.25), #6 (08.26.25), #7 (09.30.25), #8 (11.04.25), #9 (12.16.25) - h/o ex ARMD OD - s/p IVA OD x12, last injxn 2012 -- now w/ central disciform scar / atrophy - OS with recent conversion -- pt reports decreased vision OS x several months  - BCVA OD stable at CF, OS 20/40 stable  - OCT OD: Central disciform scar, no fluid, no DME; OS: stable improvement in CNV/PED, SRHM, and edema nasal macula, no DM at 6 wks - recommend IVA OS #10 today 02.10.26 with follow up ext to 8 weeks - pt wishes to be treated with IVA OS - RBA of procedure discussed, questions answered - informed consent  obtained and signed 04.08.25  - see procedure note - Eylea approved but pt will owe 20% -- pt not opposed to covering cost if needed  - f/u in 8 wks -- DFE/OCT, possible injection   3-5. Diabetes mellitus, type 2 without retinopathy  - A1C 7.6 on 06.12.25 - The incidence, risk factors for progression, natural history and treatment options for diabetic retinopathy  were discussed with patient.   - The need for close monitoring of blood glucose, blood pressure, and serum lipids, avoiding cigarette or any type of tobacco, and the need for long term follow up was also discussed with patient. - f/u in 1 year, sooner prn   6,7. Hypertensive retinopathy OU - discussed importance of tight BP control - monitor  8. Pseudophakia OU  - s/p CE/IOL OU (Dr. Medford Gaudy)  - IOL in good position, doing well  - monitor   Ophthalmic Meds Ordered this visit:  No orders of the defined types were placed in this encounter.    No follow-ups on file.  There are no Patient Instructions on file for this visit.  Explained the diagnoses, plan, and follow up with the patient and they expressed understanding.  Patient expressed understanding of the importance of proper follow up care.   This document serves as a record of services personally performed by Redell JUDITHANN Hans, MD, PhD. It was created on their behalf by Wanda GEANNIE Keens, COT an ophthalmic technician. The creation of this record is the provider's dictation and/or activities during the visit.    Electronically signed by:  Wanda GEANNIE Keens, COT  09/04/24 9:48 AM  Redell JUDITHANN Hans, M.D., Ph.D. Diseases & Surgery of the Retina and Vitreous [facility]    Abbreviations: M myopia (nearsighted); A astigmatism; H hyperopia (farsighted); P presbyopia; Mrx spectacle prescription;  CTL contact lenses; OD right eye; OS left eye; OU both eyes  XT exotropia; ET esotropia; PEK punctate epithelial keratitis; PEE punctate epithelial  erosions; DES dry eye syndrome; MGD meibomian gland dysfunction; ATs artificial tears; PFAT's preservative free artificial tears; NSC nuclear sclerotic cataract; PSC posterior subcapsular cataract; ERM epi-retinal membrane; PVD posterior vitreous detachment; RD retinal detachment; DM diabetes mellitus; DR diabetic retinopathy; NPDR non-proliferative diabetic retinopathy; PDR proliferative diabetic retinopathy; CSME clinically significant macular edema; DME diabetic macular edema; dbh dot blot hemorrhages; CWS cotton wool spot; POAG primary open angle glaucoma; C/D cup-to-disc ratio; HVF humphrey visual field; GVF goldmann visual field; OCT optical coherence tomography; IOP intraocular pressure; BRVO Branch retinal vein occlusion; CRVO central retinal vein occlusion; CRAO central retinal artery occlusion; BRAO branch retinal artery occlusion; RT retinal tear; SB scleral buckle; PPV pars plana vitrectomy; VH Vitreous hemorrhage; PRP panretinal laser photocoagulation; IVK intravitreal kenalog; VMT vitreomacular traction; MH Macular hole;  NVD neovascularization of the disc; NVE neovascularization elsewhere; AREDS age related eye disease study; ARMD age related macular degeneration; POAG primary open angle glaucoma; EBMD epithelial/anterior basement membrane dystrophy; ACIOL anterior chamber intraocular lens; IOL intraocular lens; PCIOL posterior chamber intraocular lens; Phaco/IOL phacoemulsification with intraocular lens placement; PRK photorefractive keratectomy; LASIK laser assisted in situ keratomileusis; HTN hypertension; DM diabetes mellitus; COPD chronic obstructive pulmonary disease  "

## 2024-09-18 ENCOUNTER — Encounter (INDEPENDENT_AMBULATORY_CARE_PROVIDER_SITE_OTHER): Admitting: Ophthalmology

## 2024-09-18 DIAGNOSIS — Z961 Presence of intraocular lens: Secondary | ICD-10-CM

## 2024-09-18 DIAGNOSIS — I10 Essential (primary) hypertension: Secondary | ICD-10-CM

## 2024-09-18 DIAGNOSIS — E119 Type 2 diabetes mellitus without complications: Secondary | ICD-10-CM

## 2024-09-18 DIAGNOSIS — H353213 Exudative age-related macular degeneration, right eye, with inactive scar: Secondary | ICD-10-CM

## 2024-09-18 DIAGNOSIS — H35033 Hypertensive retinopathy, bilateral: Secondary | ICD-10-CM

## 2024-09-18 DIAGNOSIS — H353221 Exudative age-related macular degeneration, left eye, with active choroidal neovascularization: Secondary | ICD-10-CM

## 2024-10-23 ENCOUNTER — Ambulatory Visit
